# Patient Record
Sex: Male | Born: 1955 | Race: Black or African American | Hispanic: No | Smoking: Never smoker
Health system: Southern US, Community
[De-identification: ages and names within clinical notes are randomized; demographics above are authoritative.]

## PROBLEM LIST (undated history)

## (undated) DIAGNOSIS — G4730 Sleep apnea, unspecified: Secondary | ICD-10-CM

## (undated) DIAGNOSIS — I10 Essential (primary) hypertension: Secondary | ICD-10-CM

## (undated) DIAGNOSIS — D649 Anemia, unspecified: Secondary | ICD-10-CM

## (undated) DIAGNOSIS — M869 Osteomyelitis, unspecified: Secondary | ICD-10-CM

## (undated) DIAGNOSIS — E119 Type 2 diabetes mellitus without complications: Secondary | ICD-10-CM

## (undated) HISTORY — PX: JOINT REPLACEMENT: SHX530

---

## 2013-05-15 ENCOUNTER — Emergency Department: Payer: Self-pay | Admitting: Emergency Medicine

## 2013-05-18 LAB — BETA STREP CULTURE(ARMC)

## 2016-09-23 ENCOUNTER — Encounter
Admission: EM | Disposition: A | Discharge status: Skilled Nursing Facility | Payer: Self-pay | Source: Home / Self Care | Attending: Internal Medicine

## 2016-09-23 ENCOUNTER — Inpatient Hospital Stay: Payer: 59 | Admitting: Anesthesiology

## 2016-09-23 ENCOUNTER — Encounter: Payer: Self-pay | Admitting: Anesthesiology

## 2016-09-23 ENCOUNTER — Emergency Department: Payer: 59

## 2016-09-23 ENCOUNTER — Inpatient Hospital Stay
Admission: EM | Admit: 2016-09-23 | Discharge: 2016-10-10 | DRG: 853 | Disposition: A | Discharge status: Skilled Nursing Facility | Payer: 59 | Attending: Internal Medicine | Admitting: Internal Medicine

## 2016-09-23 DIAGNOSIS — L02611 Cutaneous abscess of right foot: Secondary | ICD-10-CM | POA: Diagnosis present

## 2016-09-23 DIAGNOSIS — D473 Essential (hemorrhagic) thrombocythemia: Secondary | ICD-10-CM | POA: Diagnosis not present

## 2016-09-23 DIAGNOSIS — D638 Anemia in other chronic diseases classified elsewhere: Secondary | ICD-10-CM | POA: Diagnosis present

## 2016-09-23 DIAGNOSIS — E669 Obesity, unspecified: Secondary | ICD-10-CM | POA: Diagnosis present

## 2016-09-23 DIAGNOSIS — M868X8 Other osteomyelitis, other site: Secondary | ICD-10-CM | POA: Diagnosis present

## 2016-09-23 DIAGNOSIS — L089 Local infection of the skin and subcutaneous tissue, unspecified: Secondary | ICD-10-CM

## 2016-09-23 DIAGNOSIS — E876 Hypokalemia: Secondary | ICD-10-CM | POA: Diagnosis present

## 2016-09-23 DIAGNOSIS — Z96653 Presence of artificial knee joint, bilateral: Secondary | ICD-10-CM | POA: Diagnosis present

## 2016-09-23 DIAGNOSIS — E781 Pure hyperglyceridemia: Secondary | ICD-10-CM | POA: Diagnosis present

## 2016-09-23 DIAGNOSIS — J9601 Acute respiratory failure with hypoxia: Secondary | ICD-10-CM | POA: Diagnosis not present

## 2016-09-23 DIAGNOSIS — Z6833 Body mass index (BMI) 33.0-33.9, adult: Secondary | ICD-10-CM

## 2016-09-23 DIAGNOSIS — G253 Myoclonus: Secondary | ICD-10-CM | POA: Diagnosis not present

## 2016-09-23 DIAGNOSIS — A401 Sepsis due to streptococcus, group B: Secondary | ICD-10-CM | POA: Diagnosis present

## 2016-09-23 DIAGNOSIS — G9341 Metabolic encephalopathy: Secondary | ICD-10-CM | POA: Diagnosis present

## 2016-09-23 DIAGNOSIS — E871 Hypo-osmolality and hyponatremia: Secondary | ICD-10-CM | POA: Diagnosis present

## 2016-09-23 DIAGNOSIS — R4182 Altered mental status, unspecified: Secondary | ICD-10-CM

## 2016-09-23 DIAGNOSIS — E1140 Type 2 diabetes mellitus with diabetic neuropathy, unspecified: Secondary | ICD-10-CM | POA: Diagnosis present

## 2016-09-23 DIAGNOSIS — N179 Acute kidney failure, unspecified: Secondary | ICD-10-CM | POA: Diagnosis present

## 2016-09-23 DIAGNOSIS — E11621 Type 2 diabetes mellitus with foot ulcer: Secondary | ICD-10-CM | POA: Diagnosis present

## 2016-09-23 DIAGNOSIS — E1169 Type 2 diabetes mellitus with other specified complication: Secondary | ICD-10-CM | POA: Diagnosis present

## 2016-09-23 DIAGNOSIS — E1165 Type 2 diabetes mellitus with hyperglycemia: Secondary | ICD-10-CM | POA: Diagnosis present

## 2016-09-23 DIAGNOSIS — E119 Type 2 diabetes mellitus without complications: Secondary | ICD-10-CM | POA: Diagnosis present

## 2016-09-23 DIAGNOSIS — Z79899 Other long term (current) drug therapy: Secondary | ICD-10-CM

## 2016-09-23 DIAGNOSIS — E1152 Type 2 diabetes mellitus with diabetic peripheral angiopathy with gangrene: Secondary | ICD-10-CM | POA: Diagnosis present

## 2016-09-23 DIAGNOSIS — Z823 Family history of stroke: Secondary | ICD-10-CM

## 2016-09-23 DIAGNOSIS — L0390 Cellulitis, unspecified: Secondary | ICD-10-CM | POA: Diagnosis present

## 2016-09-23 DIAGNOSIS — E11628 Type 2 diabetes mellitus with other skin complications: Secondary | ICD-10-CM | POA: Diagnosis present

## 2016-09-23 DIAGNOSIS — A480 Gas gangrene: Secondary | ICD-10-CM | POA: Diagnosis present

## 2016-09-23 DIAGNOSIS — Z713 Dietary counseling and surveillance: Secondary | ICD-10-CM

## 2016-09-23 DIAGNOSIS — I70262 Atherosclerosis of native arteries of extremities with gangrene, left leg: Secondary | ICD-10-CM | POA: Diagnosis not present

## 2016-09-23 DIAGNOSIS — L02612 Cutaneous abscess of left foot: Secondary | ICD-10-CM | POA: Diagnosis present

## 2016-09-23 DIAGNOSIS — A419 Sepsis, unspecified organism: Secondary | ICD-10-CM | POA: Diagnosis present

## 2016-09-23 HISTORY — PX: AMPUTATION TOE: SHX6595

## 2016-09-23 HISTORY — PX: INCISION AND DRAINAGE: SHX5863

## 2016-09-23 LAB — BASIC METABOLIC PANEL
ANION GAP: 14 (ref 5–15)
BUN: 32 mg/dL — ABNORMAL HIGH (ref 6–20)
CALCIUM: 8.8 mg/dL — AB (ref 8.9–10.3)
CO2: 23 mmol/L (ref 22–32)
CREATININE: 1.38 mg/dL — AB (ref 0.61–1.24)
Chloride: 88 mmol/L — ABNORMAL LOW (ref 101–111)
GFR, EST NON AFRICAN AMERICAN: 54 mL/min — AB (ref >60)
GLUCOSE: 452 mg/dL — AB (ref 65–99)
Potassium: 3.5 mmol/L (ref 3.5–5.1)
Sodium: 125 mmol/L — ABNORMAL LOW (ref 135–145)

## 2016-09-23 LAB — GLUCOSE, CAPILLARY
GLUCOSE-CAPILLARY: 433 mg/dL — AB (ref 65–99)
Glucose-Capillary: 432 mg/dL — ABNORMAL HIGH (ref 65–99)
Glucose-Capillary: 415 mg/dL — ABNORMAL HIGH (ref 65–99)

## 2016-09-23 LAB — CBC
HCT: 36.7 % — ABNORMAL LOW (ref 40.0–52.0)
HEMOGLOBIN: 12.4 g/dL — AB (ref 13.0–18.0)
MCH: 28.6 pg (ref 26.0–34.0)
MCHC: 33.7 g/dL (ref 32.0–36.0)
MCV: 84.9 fL (ref 80.0–100.0)
PLATELETS: 221 10*3/uL (ref 150–440)
RBC: 4.32 MIL/uL — ABNORMAL LOW (ref 4.40–5.90)
RDW: 13.6 % (ref 11.5–14.5)
WBC: 25.4 10*3/uL — ABNORMAL HIGH (ref 3.8–10.6)

## 2016-09-23 LAB — LACTIC ACID, PLASMA: Lactic Acid, Venous: 2.8 mmol/L (ref 0.5–1.9)

## 2016-09-23 SURGERY — MINOR IRRIGATION AND DEBRIDEMENT EXTREMITY
Anesthesia: Choice | Laterality: Left

## 2016-09-23 SURGERY — INCISION AND DRAINAGE
Anesthesia: General | Laterality: Left

## 2016-09-23 MED ORDER — LIDOCAINE HCL (CARDIAC) 20 MG/ML IV SOLN
INTRAVENOUS | Status: DC | PRN
  Administered 2016-09-23: 40 mg via INTRAVENOUS

## 2016-09-23 MED ORDER — PROPOFOL 10 MG/ML IV BOLUS
INTRAVENOUS | Status: AC
Start: 2016-09-23 — End: 2016-09-23
  Filled 2016-09-23: qty 20

## 2016-09-23 MED ORDER — SODIUM CHLORIDE 0.9 % IV BOLUS (SEPSIS)
1000.0000 mL | Freq: Once | INTRAVENOUS | Status: AC
  Administered 2016-09-23: 1000 mL via INTRAVENOUS

## 2016-09-23 MED ORDER — VANCOMYCIN HCL IN DEXTROSE 1-5 GM/200ML-% IV SOLN
1000.0000 mg | Freq: Once | INTRAVENOUS | Status: AC
  Administered 2016-09-23: 1000 mg via INTRAVENOUS
  Filled 2016-09-23: qty 200

## 2016-09-23 MED ORDER — PROPOFOL 10 MG/ML IV BOLUS
INTRAVENOUS | Status: DC | PRN
  Administered 2016-09-23: 200 mg via INTRAVENOUS

## 2016-09-23 MED ORDER — SUCCINYLCHOLINE CHLORIDE 20 MG/ML IJ SOLN
INTRAMUSCULAR | Status: DC | PRN
  Administered 2016-09-23: 120 mg via INTRAVENOUS

## 2016-09-23 MED ORDER — SUCCINYLCHOLINE CHLORIDE 20 MG/ML IJ SOLN
INTRAMUSCULAR | Status: AC
  Filled 2016-09-23: qty 1

## 2016-09-23 MED ORDER — MIDAZOLAM HCL 2 MG/2ML IJ SOLN
INTRAMUSCULAR | Status: DC | PRN
  Administered 2016-09-23: 2 mg via INTRAVENOUS

## 2016-09-23 MED ORDER — ROCURONIUM BROMIDE 100 MG/10ML IV SOLN
INTRAVENOUS | Status: DC | PRN
  Administered 2016-09-23: 25 mg via INTRAVENOUS
  Administered 2016-09-23: 10 mg via INTRAVENOUS

## 2016-09-23 MED ORDER — VANCOMYCIN HCL IN DEXTROSE 1-5 GM/200ML-% IV SOLN
1000.0000 mg | Freq: Two times a day (BID) | INTRAVENOUS | Status: DC
  Administered 2016-09-24 – 2016-09-25 (×3): 1000 mg via INTRAVENOUS
  Filled 2016-09-23 (×5): qty 200

## 2016-09-23 MED ORDER — LIDOCAINE HCL (PF) 2 % IJ SOLN
INTRAMUSCULAR | Status: AC
  Filled 2016-09-23: qty 2

## 2016-09-23 MED ORDER — SODIUM CHLORIDE 0.9 % IV SOLN
INTRAVENOUS | Status: DC | PRN
  Administered 2016-09-23: 23:00:00 via INTRAVENOUS

## 2016-09-23 MED ORDER — FENTANYL CITRATE (PF) 100 MCG/2ML IJ SOLN
INTRAMUSCULAR | Status: DC | PRN
  Administered 2016-09-23: 100 ug via INTRAVENOUS
  Administered 2016-09-24 (×4): 25 ug via INTRAVENOUS

## 2016-09-23 MED ORDER — BUPIVACAINE HCL (PF) 0.25 % IJ SOLN
INTRAMUSCULAR | Status: AC
  Filled 2016-09-23: qty 30

## 2016-09-23 MED ORDER — PIPERACILLIN-TAZOBACTAM 3.375 G IVPB
3.3750 g | Freq: Three times a day (TID) | INTRAVENOUS | Status: DC
  Administered 2016-09-24 (×2): 3.375 g via INTRAVENOUS
  Filled 2016-09-23 (×5): qty 50

## 2016-09-23 MED ORDER — PIPERACILLIN-TAZOBACTAM 3.375 G IVPB 30 MIN
3.3750 g | Freq: Once | INTRAVENOUS | Status: AC
  Administered 2016-09-23: 3.375 g via INTRAVENOUS
  Filled 2016-09-23: qty 50

## 2016-09-23 MED ORDER — MIDAZOLAM HCL 2 MG/2ML IJ SOLN
INTRAMUSCULAR | Status: AC
  Filled 2016-09-23: qty 2

## 2016-09-23 MED ORDER — LIDOCAINE HCL 2 % EX GEL
CUTANEOUS | Status: AC
  Filled 2016-09-23: qty 5

## 2016-09-23 MED ORDER — FENTANYL CITRATE (PF) 100 MCG/2ML IJ SOLN
INTRAMUSCULAR | Status: AC
  Filled 2016-09-23: qty 2

## 2016-09-23 SURGICAL SUPPLY — 71 items
BANDAGE ACE 4X5 VEL STRL LF (GAUZE/BANDAGES/DRESSINGS) ×3 IMPLANT
BANDAGE ELASTIC 4 LF NS (GAUZE/BANDAGES/DRESSINGS) ×3 IMPLANT
BANDAGE STRETCH 3X4.1 STRL (GAUZE/BANDAGES/DRESSINGS) ×6 IMPLANT
BLADE OSC/SAGITTAL MD 5.5X18 (BLADE) IMPLANT
BLADE OSCILLATING/SAGITTAL (BLADE) ×1
BLADE SURG 15 STRL LF DISP TIS (BLADE) ×2 IMPLANT
BLADE SURG 15 STRL SS (BLADE) ×1
BLADE SURG MINI STRL (BLADE) IMPLANT
BLADE SW THK.38XMED LNG THN (BLADE) ×2 IMPLANT
BNDG COHESIVE 4X5 TAN STRL (GAUZE/BANDAGES/DRESSINGS) ×3 IMPLANT
BNDG COHESIVE 6X5 TAN STRL LF (GAUZE/BANDAGES/DRESSINGS) IMPLANT
BNDG ESMARK 4X12 TAN STRL LF (GAUZE/BANDAGES/DRESSINGS) ×6 IMPLANT
BNDG GAUZE 4.5X4.1 6PLY STRL (MISCELLANEOUS) ×6 IMPLANT
CANISTER SUCT 1200ML W/VALVE (MISCELLANEOUS) ×3 IMPLANT
CANISTER SUCT 3000ML PPV (MISCELLANEOUS) ×3 IMPLANT
CUFF TOURN 18 STER (MISCELLANEOUS) ×6 IMPLANT
CUFF TOURN DUAL PL 12 NO SLV (MISCELLANEOUS) IMPLANT
DRAPE BILAT LIMB 76X120 89291 (MISCELLANEOUS) ×3 IMPLANT
DRAPE FLUOR MINI C-ARM 54X84 (DRAPES) ×3 IMPLANT
DRAPE XRAY CASSETTE 23X24 (DRAPES) IMPLANT
DRESSING ALLEVYN 4X4 (MISCELLANEOUS) IMPLANT
DURAPREP 26ML APPLICATOR (WOUND CARE) ×3 IMPLANT
ELECT REM PT RETURN 9FT ADLT (ELECTROSURGICAL) ×3
ELECTRODE REM PT RTRN 9FT ADLT (ELECTROSURGICAL) ×2 IMPLANT
GAUZE PACKING 1/4 X5 YD (GAUZE/BANDAGES/DRESSINGS) IMPLANT
GAUZE PACKING IODOFORM 1/2 (PACKING) ×3 IMPLANT
GAUZE PACKING IODOFORM 1X5 (MISCELLANEOUS) IMPLANT
GAUZE PETRO XEROFOAM 1X8 (MISCELLANEOUS) ×6 IMPLANT
GAUZE SPONGE 4X4 12PLY STRL (GAUZE/BANDAGES/DRESSINGS) ×9 IMPLANT
GAUZE STRETCH 2X75IN STRL (MISCELLANEOUS) ×3 IMPLANT
GLOVE BIO SURGEON STRL SZ7.5 (GLOVE) ×9 IMPLANT
GLOVE INDICATOR 8.0 STRL GRN (GLOVE) ×9 IMPLANT
GOWN STRL REUS W/ TWL LRG LVL3 (GOWN DISPOSABLE) ×4 IMPLANT
GOWN STRL REUS W/TWL LRG LVL3 (GOWN DISPOSABLE) ×2
GOWN STRL REUS W/TWL MED LVL3 (GOWN DISPOSABLE) ×3 IMPLANT
HANDPIECE INTERPULSE COAX TIP (DISPOSABLE) ×1
HANDPIECE VERSAJET DEBRIDEMENT (MISCELLANEOUS) ×3 IMPLANT
IV NS 1000ML (IV SOLUTION) ×1
IV NS 1000ML BAXH (IV SOLUTION) ×2 IMPLANT
KIT DRSG VAC SLVR GRANUFM (MISCELLANEOUS) IMPLANT
KIT RM TURNOVER STRD PROC AR (KITS) ×3 IMPLANT
LABEL OR SOLS (LABEL) ×3 IMPLANT
NEEDLE FILTER BLUNT 18X 1/2SAF (NEEDLE) ×1
NEEDLE FILTER BLUNT 18X1 1/2 (NEEDLE) ×2 IMPLANT
NEEDLE HYPO 25X1 1.5 SAFETY (NEEDLE) ×9 IMPLANT
NS IRRIG 500ML POUR BTL (IV SOLUTION) ×3 IMPLANT
PACK EXTREMITY ARMC (MISCELLANEOUS) ×3 IMPLANT
PAD ABD DERMACEA PRESS 5X9 (GAUZE/BANDAGES/DRESSINGS) ×6 IMPLANT
PENCIL ELECTRO HAND CTR (MISCELLANEOUS) ×6 IMPLANT
RASP SM TEAR CROSS CUT (RASP) IMPLANT
SET HNDPC FAN SPRY TIP SCT (DISPOSABLE) ×2 IMPLANT
SOL .9 NS 3000ML IRR  AL (IV SOLUTION) ×1
SOL .9 NS 3000ML IRR UROMATIC (IV SOLUTION) ×2 IMPLANT
STOCKINETTE IMPERVIOUS 9X36 MD (GAUZE/BANDAGES/DRESSINGS) ×12 IMPLANT
STOCKINETTE M/LG 89821 (MISCELLANEOUS) ×3 IMPLANT
STRAP SAFETY BODY (MISCELLANEOUS) ×3 IMPLANT
SUT ETHILON 2 0 FS 18 (SUTURE) IMPLANT
SUT ETHILON 3-0 FS-10 30 BLK (SUTURE) ×9
SUT ETHILON 4-0 (SUTURE)
SUT ETHILON 4-0 FS2 18XMFL BLK (SUTURE)
SUT ETHILON 5-0 FS-2 18 BLK (SUTURE) IMPLANT
SUT VIC AB 3-0 SH 27 (SUTURE) ×1
SUT VIC AB 3-0 SH 27X BRD (SUTURE) ×2 IMPLANT
SUT VIC AB 4-0 FS2 27 (SUTURE) IMPLANT
SUTURE EHLN 3-0 FS-10 30 BLK (SUTURE) ×6 IMPLANT
SUTURE ETHLN 4-0 FS2 18XMF BLK (SUTURE) IMPLANT
SWAB CULTURE AMIES ANAERIB BLU (MISCELLANEOUS) ×6 IMPLANT
SYR 3ML LL SCALE MARK (SYRINGE) ×3 IMPLANT
SYRINGE 10CC LL (SYRINGE) ×9 IMPLANT
TRAY PREP VAG/GEN (MISCELLANEOUS) ×3 IMPLANT
WND VAC CANISTER 500ML (MISCELLANEOUS) IMPLANT

## 2016-09-23 NOTE — Consult Note (Signed)
ORTHOPAEDIC CONSULTATION  REQUESTING PHYSICIAN: Hugelmeyer, Alexis, DO  Chief Complaint: Infection b/l feet  HPI: Kyle Banks is a 61 y.o. male who complains of  Recent draining infection.  Denies any complaints prior to 3-4 days ago.  Noticed redness in pain to feet.  Came to ER today for evaluation.  Noted to have blood sugar in 400 range and xrays concerning for gas in soft tissue.  Markedly elevated WBC and tachycardic.  Code sepsis currently active. Does not have a pcp.  No past medical history on file. No past surgical history on file. Social History   Social History  . Marital status: Divorced    Spouse name: N/A  . Number of children: N/A  . Years of education: N/A   Social History Main Topics  . Smoking status: Not on file  . Smokeless tobacco: Not on file  . Alcohol use Not on file  . Drug use: Unknown  . Sexual activity: Not on file   Other Topics Concern  . Not on file   Social History Narrative  . No narrative on file   No family history on file. No Known Allergies Prior to Admission medications   Medication Sig Start Date End Date Taking? Authorizing Provider  cyanocobalamin 500 MCG tablet Take 500 mcg by mouth daily.   Yes [provider]  Garlic 10 MG CAPS Take 1 tablet by mouth daily.   Yes [provider]  Omega-3 Fatty Acids (FISH OIL) 1000 MG CAPS Take 1 capsule by mouth daily.   Yes [provider]   Dg Foot 2 Views Left  Result Date: 09/23/2016 CLINICAL DATA:  Drainage and swelling of the great toe EXAM: LEFT FOOT - 2 VIEW COMPARISON:  None. FINDINGS: Osteopenia is present. No fracture or subluxation is seen. Soft tissue gas tracking along the dorsum of the foot and along the medial and lateral aspect of the first digit around the MTP joint. Possible small marginal erosion first DIP joint. Plantar soft tissue calcification and vascular calcification. IMPRESSION: 1. Soft tissue emphysema over the dorsum of the foot, and  surrounding the first digit at the level of the distal metatarsal and proximal phalanx, concerning for necrotizing infection. 2. Lucent appearing first distal phalanx, cannot exclude osteomyelitis. Question of a small marginal erosion at the first PIP joint, joint space infection is a concern given the surrounding soft tissue findings. Electronically Signed   By: Donavan Foil M.D.   On: 09/23/2016 21:03   Dg Foot 2 Views Right  Result Date: 09/23/2016 CLINICAL DATA:  Swelling and drainage from the great toes on both feet EXAM: RIGHT FOOT - 2 VIEW COMPARISON:  None. FINDINGS: No fracture or malalignment. Soft tissue thickening and gas at the distal first digit and to a lesser extent the second digit. Irregular lucency and probable erosive change involving the mid and distal portion of the first distal phalanx. Plantar soft tissue calcification. IMPRESSION: 1. Soft tissue enlargement with small amount of soft tissue gas at the distal first digit. 2. Lucency and probable erosive change involving the distal first phalanx suspicious for osteomyelitis. Electronically Signed   By: Donavan Foil M.D.   On: 09/23/2016 20:59    Positive ROS: All other systems have been reviewed and were otherwise negative with the exception of those mentioned in the HPI and as above.  12 point ROS was performed.  Physical Exam: General: Alert and oriented.  No apparent distress.  Family member at bedside.  Vascular:  Left foot:Dorsalis  Pedis:  present Posterior Tibial:  diminished secondary to edema  Right foot: Dorsalis Pedis:  present Posterior Tibial:  diminished secondary to edema  Neuro:absent protective sensation  Derm:Right foot:  Noted large open ulceration distal great toe with foul odor, purulence and exposed bone.  Diffuse erythema to midfoot.  Left foot:  Large ulceration distal great toe with foul odor, purulence and exposed bone.  Diffuse erythema to midfoot.    Ortho/MS: b/l edema.  Palpable emphysema  to left foot midfoot and great toe.   Assessment: Gas producing infection b/l on xray with osteomyelitis b/l great toes. DM with neuropathy.  Plan: Pt with markedly elevated WBC with gas producing infection will need I & D tonight.  Will plan for amputation b/l great toes and I & D of any residual infection.  NPO now and has not eaten today.  Pepsi at noon today and water at 5pm.  D/w pt r/b/a/c and consent given.    Elesa Hacker, DPM Cell [phone]   09/23/2016 10:18 PM

## 2016-09-23 NOTE — Anesthesia Preprocedure Evaluation (Addendum)
Anesthesia Evaluation  Patient identified by MRN, date of birth, ID band Patient awake    Reviewed: Allergy & Precautions, H&P , NPO status , Patient's Chart, lab work & pertinent test results, reviewed documented beta blocker date and time   History of Anesthesia Complications Negative for: history of anesthetic complications  Airway Mallampati: I  TM Distance: >3 FB Neck ROM: full    Dental  (+) Teeth Intact, Dental Advidsory Given   Pulmonary neg pulmonary ROS,           Cardiovascular Exercise Tolerance: Good negative cardio ROS       Neuro/Psych negative neurological ROS  negative psych ROS   GI/Hepatic negative GI ROS, Neg liver ROS,   Endo/Other  diabetes, Poorly Controlled  Renal/GU CRFRenal disease  negative genitourinary   Musculoskeletal   Abdominal   Peds  Hematology negative hematology ROS (+)   Anesthesia Other Findings History reviewed. No pertinent past medical history.  Patient does not routinely see a doctor and presented to the ER with a blood sugar in the 450s.  Blood sugar has been consistently elevated above 400 since that time.  Given that the patient has gas seen on xray this has been deemed a surgical emergency and we will proceed as such despite inadequate blood sugar control.  Reproductive/Obstetrics negative OB ROS                            Anesthesia Physical Anesthesia Plan  ASA: III and emergent  Anesthesia Plan: General, Rapid Sequence and Cricoid Pressure   Post-op Pain Management:    Induction:   PONV Risk Score and Plan: 2 and Ondansetron  Airway Management Planned:   Additional Equipment:   Intra-op Plan:   Post-operative Plan:   Informed Consent: I have reviewed the patients History and Physical, chart, labs and discussed the procedure including the risks, benefits and alternatives for the proposed anesthesia with the patient or  authorized representative who has indicated his/her understanding and acceptance.   Dental Advisory Given  Plan Discussed with: Anesthesiologist, CRNA and Surgeon  Anesthesia Plan Comments:         Anesthesia Quick Evaluation

## 2016-09-23 NOTE — ED Triage Notes (Signed)
Patient reports symptoms started Friday.  Reports feeling dizzy and "my toes exploded".  Patient reports great toes on both feet with swelling and drainage.  Noted to bilateral great toes swelling, dried exudate with foul odor.

## 2016-09-23 NOTE — Progress Notes (Signed)
Pharmacy Antibiotic Note  Kyle Banks is a 61 y.o. male admitted on 09/23/2016 with sepsis.  Pharmacy has been consulted for Zosyn and vancomycin dosing.  Plan: 1. Zosyn 3.375 gm IV Q8H EI 1. Vancomycin 1 gm IV x 1 followed in approximately 6 hours (stacked dosing) by vancomycin 1 gm IV Q12H, predicted trough 18 mcg/mL. Pharmacy will continue to follow and adjust as needed to maintain trough 15 to 20 mcg/mL.  Vd 56.7 L, Ke 0.058 hr-1, T1/2 11.9 hr  Height: 5\' 7"  (170.2 cm) Weight: 228 lb (103.4 kg) IBW/kg (Calculated) : 66.1  Temp (24hrs), Avg:98.8 F (37.1 C), Min:98.8 F (37.1 C), Max:98.8 F (37.1 C)   Recent Labs Lab 09/23/16 1944 09/23/16 1947  WBC  --  25.4*  CREATININE  --  1.38*  LATICACIDVEN 2.8*  --     Estimated Creatinine Clearance: 64.4 mL/min (A) (by C-G formula based on SCr of 1.38 mg/dL (H)).    Not on File  Thank you for allowing pharmacy to be a part of this patient's care.  Laural Benes, Pharm.D., BCPS Clinical Pharmacist 09/23/2016 9:33 PM

## 2016-09-23 NOTE — H&P (Signed)
History and Physical   [facility] - [place] @ [facility] Admission History and Physical [employer], D.O.    Patient Name: Kyle Banks MR#: [id number] Date of Birth: 30-Sep-1955 Date of Admission: 09/23/2016  Referring MD/NP/PA: Dr. Clearnce Hasten Primary Care Physician: No primary care provider on file. Patient coming from: Home  Chief Complaint:  Chief Complaint  Patient presents with  . Dizziness  . Wound Infection    HPI: Kyle Banks is a 61 y.o. male with no known medical history presents to the emergency department for evaluation of foot wounds.  Patient was in a usual state of health until 3 days ago when he describes the sudden onset of redness, pain, swelling and discharge from his feet bilaterally. He states prior to this infection his feet appeared normal. Patient is unaware of any medical problems such as diabetes and states that he has had annual physicals and blood work at various urgent care centers..  Patient denies fevers/chills, weakness, dizziness, chest pain, shortness of breath, N/V/C/D, abdominal pain, dysuria/frequency, changes in mental status.    Otherwise there has been no change in status. Patient has been taking medication as prescribed and there has been no recent change in medication or diet.  No recent antibiotics.  There has been no recent illness, hospitalizations, travel or sick contacts.    EMS/ED Course: Patient received Vanco, Zosyn, Normal saline. Medical admission was requested for sepsis secondary to cellulitis/wound infection of the bilateral toes and new onset diabetes. He was seen in the emergency department by podiatry who plans for OR tonight for I&D and possible amputation given concern for osteomyelitis and x-rays with gas in the soft tissues.  Review of Systems:  CONSTITUTIONAL: No fever/chills, fatigue, weakness, weight gain/loss, headache. EYES: No blurry or double vision. ENT: No tinnitus, postnasal drip, redness or soreness  of the oropharynx. RESPIRATORY: No cough, dyspnea, wheeze.  No hemoptysis.  CARDIOVASCULAR: No chest pain, palpitations, syncope, orthopnea. No lower extremity edema.  GASTROINTESTINAL: No nausea, vomiting, abdominal pain, diarrhea, constipation.  No hematemesis, melena or hematochezia. GENITOURINARY: No dysuria, frequency, hematuria. ENDOCRINE: No polyuria or nocturia. No heat or cold intolerance. HEMATOLOGY: No anemia, bruising, bleeding. INTEGUMENTARY: Positive toe wounds bilaterally. No rashes, ulcers, lesions. MUSCULOSKELETAL: No arthritis, gout, dyspnea. NEUROLOGIC: No numbness, tingling, ataxia, seizure-type activity, weakness. PSYCHIATRIC: No anxiety, depression, insomnia.   Past medical history: Denies  Past surgical history: Right knee surgery 5  Social history: Works as a Freight forwarder. Denies alcohol tobacco or drug use  Medications: No prescribed medications that he takes over-the-counter garlic and fish oil  Family history: Father and sister with stroke.  Prior to Admission medications   Not on File    Physical Exam: Vitals:   09/23/16 1918 09/23/16 1924  BP:  130/67  Pulse:  (!) 110  Resp:  20  Temp:  98.8 F (37.1 C)  TempSrc:  Oral  SpO2:  95%  Weight: 103.4 kg (228 lb)   Height: 5\' 7"  (1.702 m)     GENERAL: 61 y.o.-year-old Male patient, well-developed, well-nourished lying in the bed in no acute distress.  Pleasant and cooperative.   HEENT: Head atraumatic, normocephalic. Pupils equal, round, reactive to light and accommodation. No scleral icterus. Extraocular muscles intact. Nares are patent. Oropharynx is clear. Mucus membranes moist. NECK: Supple, full range of motion. No JVD, no bruit heard. No thyroid enlargement, no tenderness, no cervical lymphadenopathy. CHEST: Normal breath sounds bilaterally. No wheezing, rales, rhonchi or crackles. No use of accessory muscles of respiration.  No reproducible chest wall tenderness.  CARDIOVASCULAR: S1, S2  normal. No murmurs, rubs, or gallops. Cap refill <2 seconds.  ABDOMEN: Soft, nondistended, nontender. No rebound, guarding, rigidity. Normoactive bowel sounds present in all four quadrants. No organomegaly or mass. EXTREMITIES: Foot wounds recently dressed but noted by emergency department and podiatry to have open ulcerations with foul odor, purulent discharge and exposed bone with erythema at to the midfoot on the right as well as distal first toe ulceration with foul odor, purulent discharge and exposed bone, erythema to the midfoot on the left. Bilateral lower extremity edema mild and non-pitting to the mid calf   No calf tenderness or Homan's sign. Diminished pulses at the posterior tibial bilaterally NEUROLOGIC: The patient is alert and oriented x 3. Cranial nerves II through XII are grossly intact with no focal sensorimotor deficit. Muscle strength 5/5 in all extremities. Sensation intact. .   Labs on Admission:  CBC:  Recent Labs Lab 09/23/16 1947  WBC 25.4*  HGB 12.4*  HCT 36.7*  MCV 84.9  PLT 756   Basic Metabolic Panel:  Recent Labs Lab 09/23/16 1947  NA 125*  K 3.5  CL 88*  CO2 23  GLUCOSE 452*  BUN 32*  CREATININE 1.38*  CALCIUM 8.8*   GFR: Estimated Creatinine Clearance: 64.4 mL/min (A) (by C-G formula based on SCr of 1.38 mg/dL (H)). Liver Function Tests: No results for input(s): AST, ALT, ALKPHOS, BILITOT, PROT, ALBUMIN in the last 168 hours. No results for input(s): LIPASE, AMYLASE in the last 168 hours. No results for input(s): AMMONIA in the last 168 hours. Coagulation Profile: No results for input(s): INR, PROTIME in the last 168 hours. Cardiac Enzymes: No results for input(s): CKTOTAL, CKMB, CKMBINDEX, TROPONINI in the last 168 hours. BNP (last 3 results) No results for input(s): PROBNP in the last 8760 hours. HbA1C: No results for input(s): HGBA1C in the last 72 hours. CBG:  Recent Labs Lab 09/23/16 1947 09/23/16 1948  GLUCAP 433* 432*    Lipid Profile: No results for input(s): CHOL, HDL, LDLCALC, TRIG, CHOLHDL, LDLDIRECT in the last 72 hours. Thyroid Function Tests: No results for input(s): TSH, T4TOTAL, FREET4, T3FREE, THYROIDAB in the last 72 hours. Anemia Panel: No results for input(s): VITAMINB12, FOLATE, FERRITIN, TIBC, IRON, RETICCTPCT in the last 72 hours. Urine analysis: No results found for: COLORURINE, APPEARANCEUR, LABSPEC, PHURINE, GLUCOSEU, HGBUR, BILIRUBINUR, KETONESUR, PROTEINUR, UROBILINOGEN, NITRITE, LEUKOCYTESUR Sepsis Labs: @LABRCNTIP (procalcitonin:4,lacticidven:4) )No results found for this or any previous visit (from the past 240 hour(s)).   Radiological Exams on Admission: Dg Foot 2 Views Left  Result Date: 09/23/2016 CLINICAL DATA:  Drainage and swelling of the great toe EXAM: LEFT FOOT - 2 VIEW COMPARISON:  None. FINDINGS: Osteopenia is present. No fracture or subluxation is seen. Soft tissue gas tracking along the dorsum of the foot and along the medial and lateral aspect of the first digit around the MTP joint. Possible small marginal erosion first DIP joint. Plantar soft tissue calcification and vascular calcification. IMPRESSION: 1. Soft tissue emphysema over the dorsum of the foot, and surrounding the first digit at the level of the distal metatarsal and proximal phalanx, concerning for necrotizing infection. 2. Lucent appearing first distal phalanx, cannot exclude osteomyelitis. Question of a small marginal erosion at the first PIP joint, joint space infection is a concern given the surrounding soft tissue findings. Electronically Signed   By: Donavan Foil M.D.   On: 09/23/2016 21:03   Dg Foot 2 Views Right  Result Date: 09/23/2016 CLINICAL  DATA:  Swelling and drainage from the great toes on both feet EXAM: RIGHT FOOT - 2 VIEW COMPARISON:  None. FINDINGS: No fracture or malalignment. Soft tissue thickening and gas at the distal first digit and to a lesser extent the second digit. Irregular  lucency and probable erosive change involving the mid and distal portion of the first distal phalanx. Plantar soft tissue calcification. IMPRESSION: 1. Soft tissue enlargement with small amount of soft tissue gas at the distal first digit. 2. Lucency and probable erosive change involving the distal first phalanx suspicious for osteomyelitis. Electronically Signed   By: Donavan Foil M.D.   On: 09/23/2016 20:59    EKG: Sinus tachycardia at 106 bpm with normal axis and nonspecific ST-T wave changes.   Assessment/Plan  This is a 61 y.o. male with no known past medical history now being admitted with:  #. Sepsis secondary to cellulitis/osteomyelitis of bilateral first toes - Admit to inpatient with telemetry monitoring - IV antibiotics: Zosyn, Vanco - IV fluid hydration - Follow up blood, wound cultures - Repeat CBC in am.  - Podiatry consultation has been requested and patient will go to the OR for debridement tonight   #. New onset diabetes - Accuchecks q4h with RISS coverage - Check A1C - Diabetes educator / coordinator consulted  #. Hyponatremia - IVFs and repeat BMP in AM  #. Renal insufficiency, ? acute  - IV fluids and repeat BMP in AM.  - Avoid nephrotoxic medications - Renal sono if not improving.  - Bladder scan and place foley catheter if evidence of urinary retention  Admission status: Inpatient IV Fluids: Normal saline Diet/Nutrition: NPO for possible OR Consults called: Podiatry, diabetes educator  DVT Px: Lovenox, SCDs and early ambulation. Code Status: Full Code  Disposition Plan: To home in 2-3 days  All the records are reviewed and case discussed with ED provider. Management plans discussed with the patient and/or family who express understanding and agree with plan of care.  Clinten Howk D.O. on 09/23/2016 at 9:40 PM Between 7am to 6pm - Pager - [phone] After 6pm go to [email or web address] - Marketing executive [place]  [facility] Office [phone] CC: Primary care physician; No primary care provider on file.   09/23/2016, 9:40 PM

## 2016-09-23 NOTE — ED Notes (Signed)
Lab called to give critical lactic acid of 2.8. First nurse notified and given room 2.

## 2016-09-23 NOTE — ED Provider Notes (Signed)
[facility] Emergency Department Provider Note  ____________________________________________   First MD Initiated Contact with Patient 09/23/16 2042     (approximate)  I have reviewed the triage vital signs and the nursing notes.   HISTORY  Chief Complaint Dizziness and Wound Infection   HPI Kyle Banks is a 61 y.o. male without any chronic medical conditions was presenting to the emergency department today with 2-3 days of lower extremity swelling. He denies having history of diabetes and says that 2 days ago his lower extremities were completely normal. However, he has noticed pus and redness to his bilateral feet since then.   No past medical history on file.  There are no active problems to display for this patient.   No past surgical history on file.  Prior to Admission medications   Not on File    Allergies Patient has no allergy information on record.  No family history on file.  Social History Social History  Substance Use Topics  . Smoking status: Not on file  . Smokeless tobacco: Not on file  . Alcohol use Not on file    Review of Systems  Constitutional: No fever/chills Eyes: No visual changes. ENT: No sore throat. Cardiovascular: Denies chest pain. Respiratory: Denies shortness of breath. Gastrointestinal: No abdominal pain.  No nausea, no vomiting.  No diarrhea.  No constipation. Genitourinary: Negative for dysuria. Musculoskeletal: Negative for back pain. Skin: as above Neurological: Negative for headaches, focal weakness or numbness.   ____________________________________________   PHYSICAL EXAM:  VITAL SIGNS: ED Triage Vitals  Enc Vitals Group     BP 09/23/16 1924 130/67     Pulse Rate 09/23/16 1924 (!) 110     Resp 09/23/16 1924 20     Temp 09/23/16 1924 98.8 F (37.1 C)     Temp Source 09/23/16 1924 Oral     SpO2 09/23/16 1924 95 %     Weight 09/23/16 1918 228 lb (103.4 kg)     Height 09/23/16  1918 5\' 7"  (1.702 m)     Head Circumference --      Peak Flow --      Pain Score 09/23/16 1918 9     Pain Loc --      Pain Edu? --      Excl. in [place]? --     Constitutional: Alert and oriented. Well appearing and in no acute distress. Eyes: Conjunctivae are normal.  Head: Atraumatic. Nose: No congestion/rhinnorhea. Mouth/Throat: Mucous membranes are moist.  Neck: No stridor.   Cardiovascular: Tachycardic, regular rhythm. Grossly normal heart sounds.  Good peripheral circulation With equal and intact or Celsius pulses. Respiratory: Normal respiratory effort.  No retractions. Lungs CTAB. Gastrointestinal: Soft and nontender. No distention.  Musculoskeletal: No lower extremity tenderness.  No joint effusions. Neurologic:  Normal speech and language. No gross focal neurologic deficits are appreciated. Skin:  Bilateral lower extremity erythema to the bilateral feet with pus visualized at the bilateral great toes with ulceration over the plantar aspects of the bilateral great toes. There is also a tree bark-like chronic appearance the bilateral great toes with swelling bilaterally extending up onto the forefoot. Mild tenderness to palpation. Warmth to touch. Psychiatric: Mood and affect are normal. Speech and behavior are normal.  ____________________________________________   LABS (all labs ordered are listed, but only abnormal results are displayed)  Labs Reviewed  BASIC METABOLIC PANEL - Abnormal; Notable for the following:       Result Value   Sodium 125 (*)  Chloride 88 (*)    Glucose, Bld 452 (*)    BUN 32 (*)    Creatinine, Ser 1.38 (*)    Calcium 8.8 (*)    GFR calc non Af Amer 54 (*)    All other components within normal limits  CBC - Abnormal; Notable for the following:    WBC 25.4 (*)    RBC 4.32 (*)    Hemoglobin 12.4 (*)    HCT 36.7 (*)    All other components within normal limits  LACTIC ACID, PLASMA - Abnormal; Notable for the following:    Lactic Acid, Venous  2.8 (*)    All other components within normal limits  GLUCOSE, CAPILLARY - Abnormal; Notable for the following:    Glucose-Capillary 433 (*)    All other components within normal limits  GLUCOSE, CAPILLARY - Abnormal; Notable for the following:    Glucose-Capillary 432 (*)    All other components within normal limits  CULTURE, BLOOD (ROUTINE X 2)  CULTURE, BLOOD (ROUTINE X 2)  URINALYSIS, COMPLETE (UACMP) WITH MICROSCOPIC  LACTIC ACID, PLASMA  URINALYSIS, ROUTINE W REFLEX MICROSCOPIC  CBG MONITORING, ED   ____________________________________________  EKG  ED ECG REPORT I, Doran Stabler, the attending physician, personally viewed and interpreted this ECG.   Date: 09/23/2016  EKG Time: 1930  Rate: 106  Rhythm: sinus tachycardia with PVC times one  Axis: normal  Intervals:none  ST&T Change: No ST segment elevation or depression. No abnormal T-wave inversions.  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 ____________________________________________   PROCEDURES  Procedure(s) performed:   Procedures  Critical Care performed:  CRITICAL CARE Performed by: Doran Stabler   Total critical care time: 35 minutes  Critical care time was exclusive of separately billable procedures and treating other patients.  Critical care was necessary to treat or prevent imminent or life-threatening deterioration.  Critical care was time spent personally by me on the following activities: development of treatment plan with patient and/or surrogate as well as nursing, discussions with consultants, evaluation of patient's response to treatment, examination of patient, obtaining history from patient or surrogate, ordering and performing treatments and interventions, ordering and review of laboratory studies, ordering and review of radiographic studies, pulse oximetry and re-evaluation of patient's  condition.   ____________________________________________   INITIAL IMPRESSION / ASSESSMENT AND PLAN / ED COURSE  Pertinent labs & imaging results that were available during my care of the patient were reviewed by me and considered in my medical decision making (see chart for details).  ----------------------------------------- 9:41 PM on 09/23/2016 -----------------------------------------  Patient received fluids for his elevated glucose. Sepsis alert called. Also discussed the case with Dr. Vickki Muff of podiatry will be coming to the hospital to evaluate the patient. Patient aware me permission to the hospital. Signed out to the medicine service, Dr. Ara Kussmaul.       ____________________________________________   FINAL CLINICAL IMPRESSION(S) / ED DIAGNOSES  Sepsis. Bilateral cellulitis of the feet. New-onset diabetes.    NEW MEDICATIONS STARTED DURING THIS VISIT:  New Prescriptions   No medications on file     Note:  This document was prepared using Dragon voice recognition software and may include unintentional dictation errors.     Orbie Pyo, MD 09/23/16 2141

## 2016-09-24 ENCOUNTER — Inpatient Hospital Stay: Payer: 59

## 2016-09-24 LAB — BLOOD CULTURE ID PANEL (REFLEXED)
Acinetobacter baumannii: NOT DETECTED
Candida albicans: NOT DETECTED
Candida glabrata: NOT DETECTED
Candida krusei: NOT DETECTED
Candida parapsilosis: NOT DETECTED
Candida tropicalis: NOT DETECTED
Enterobacter cloacae complex: NOT DETECTED
Enterobacteriaceae species: NOT DETECTED
Enterococcus species: NOT DETECTED
Escherichia coli: NOT DETECTED
Haemophilus influenzae: NOT DETECTED
Klebsiella oxytoca: NOT DETECTED
Klebsiella pneumoniae: NOT DETECTED
Listeria monocytogenes: NOT DETECTED
NEISSERIA MENINGITIDIS: NOT DETECTED
PROTEUS SPECIES: NOT DETECTED
PSEUDOMONAS AERUGINOSA: NOT DETECTED
SERRATIA MARCESCENS: NOT DETECTED
STAPHYLOCOCCUS AUREUS BCID: NOT DETECTED
STAPHYLOCOCCUS SPECIES: NOT DETECTED
STREPTOCOCCUS PNEUMONIAE: NOT DETECTED
STREPTOCOCCUS SPECIES: DETECTED — AB
Streptococcus agalactiae: DETECTED — AB
Streptococcus pyogenes: NOT DETECTED

## 2016-09-24 LAB — BLOOD GAS, ARTERIAL
Acid-Base Excess: 4.2 mmol/L — ABNORMAL HIGH (ref 0.0–2.0)
Bicarbonate: 28.4 mmol/L — ABNORMAL HIGH (ref 20.0–28.0)
FIO2: 0.32
O2 SAT: 90.4 %
PATIENT TEMPERATURE: 37
PO2 ART: 56 mmHg — AB (ref 83.0–108.0)
pCO2 arterial: 40 mmHg (ref 32.0–48.0)
pH, Arterial: 7.46 — ABNORMAL HIGH (ref 7.350–7.450)

## 2016-09-24 LAB — COMPREHENSIVE METABOLIC PANEL
ALBUMIN: 2.2 g/dL — AB (ref 3.5–5.0)
ALK PHOS: 167 U/L — AB (ref 38–126)
ALT: 40 U/L (ref 17–63)
ANION GAP: 13 (ref 5–15)
AST: 29 U/L (ref 15–41)
BILIRUBIN TOTAL: 1.1 mg/dL (ref 0.3–1.2)
BUN: 32 mg/dL — ABNORMAL HIGH (ref 6–20)
CALCIUM: 8.1 mg/dL — AB (ref 8.9–10.3)
CO2: 23 mmol/L (ref 22–32)
Chloride: 92 mmol/L — ABNORMAL LOW (ref 101–111)
Creatinine, Ser: 1.47 mg/dL — ABNORMAL HIGH (ref 0.61–1.24)
GFR, EST AFRICAN AMERICAN: 58 mL/min — AB (ref >60)
GFR, EST NON AFRICAN AMERICAN: 50 mL/min — AB (ref >60)
GLUCOSE: 460 mg/dL — AB (ref 65–99)
Potassium: 4.2 mmol/L (ref 3.5–5.1)
Sodium: 128 mmol/L — ABNORMAL LOW (ref 135–145)
TOTAL PROTEIN: 7.7 g/dL (ref 6.5–8.1)

## 2016-09-24 LAB — CBC
HCT: 30.2 % — ABNORMAL LOW (ref 40.0–52.0)
Hemoglobin: 10.2 g/dL — ABNORMAL LOW (ref 13.0–18.0)
MCH: 29.2 pg (ref 26.0–34.0)
MCHC: 33.9 g/dL (ref 32.0–36.0)
MCV: 86.1 fL (ref 80.0–100.0)
PLATELETS: 200 10*3/uL (ref 150–440)
RBC: 3.51 MIL/uL — AB (ref 4.40–5.90)
RDW: 13.9 % (ref 11.5–14.5)
WBC: 26.3 10*3/uL — AB (ref 3.8–10.6)

## 2016-09-24 LAB — GLUCOSE, CAPILLARY
GLUCOSE-CAPILLARY: 235 mg/dL — AB (ref 65–99)
GLUCOSE-CAPILLARY: 280 mg/dL — AB (ref 65–99)
GLUCOSE-CAPILLARY: 401 mg/dL — AB (ref 65–99)
GLUCOSE-CAPILLARY: 433 mg/dL — AB (ref 65–99)
GLUCOSE-CAPILLARY: 442 mg/dL — AB (ref 65–99)
Glucose-Capillary: 449 mg/dL — ABNORMAL HIGH (ref 65–99)
Glucose-Capillary: 406 mg/dL — ABNORMAL HIGH (ref 65–99)
Glucose-Capillary: 348 mg/dL — ABNORMAL HIGH (ref 65–99)
Glucose-Capillary: 266 mg/dL — ABNORMAL HIGH (ref 65–99)
Glucose-Capillary: 264 mg/dL — ABNORMAL HIGH (ref 65–99)

## 2016-09-24 LAB — PROTIME-INR
INR: 1.27
Prothrombin Time: 16 seconds — ABNORMAL HIGH (ref 11.4–15.2)

## 2016-09-24 LAB — PHOSPHORUS: Phosphorus: 3.7 mg/dL (ref 2.5–4.6)

## 2016-09-24 LAB — URINALYSIS, COMPLETE (UACMP) WITH MICROSCOPIC
Bacteria, UA: NONE SEEN
Bilirubin Urine: NEGATIVE
Glucose, UA: >=500 mg/dL — AB
Ketones, ur: 5 mg/dL — AB
Leukocytes, UA: NEGATIVE
Nitrite: NEGATIVE
Protein, ur: 100 mg/dL — AB
Specific Gravity, Urine: 1.02 (ref 1.005–1.030)
pH: 6 (ref 5.0–8.0)

## 2016-09-24 LAB — LACTIC ACID, PLASMA: Lactic Acid, Venous: 1.8 mmol/L (ref 0.5–1.9)

## 2016-09-24 LAB — APTT: APTT: 33 s (ref 24–36)

## 2016-09-24 LAB — MAGNESIUM: MAGNESIUM: 2 mg/dL (ref 1.7–2.4)

## 2016-09-24 MED ORDER — VANCOMYCIN HCL IN DEXTROSE 1-5 GM/200ML-% IV SOLN: 1000.0000 mg | Freq: Once | INTRAVENOUS | Status: DC

## 2016-09-24 MED ORDER — ENOXAPARIN SODIUM 40 MG/0.4ML ~~LOC~~ SOLN
40.0000 mg | SUBCUTANEOUS | Status: DC
  Administered 2016-09-25 – 2016-10-09 (×14): 40 mg via SUBCUTANEOUS
  Filled 2016-09-24 (×14): qty 0.4

## 2016-09-24 MED ORDER — DEXTROSE 5 % IV SOLN
2.0000 g | Freq: Three times a day (TID) | INTRAVENOUS | Status: DC
  Administered 2016-09-24 – 2016-09-25 (×3): 2 g via INTRAVENOUS
  Filled 2016-09-24 (×6): qty 2

## 2016-09-24 MED ORDER — SUGAMMADEX SODIUM 200 MG/2ML IV SOLN
INTRAVENOUS | Status: AC
  Filled 2016-09-24: qty 2

## 2016-09-24 MED ORDER — PHENYLEPHRINE HCL 10 MG/ML IJ SOLN
INTRAMUSCULAR | Status: DC | PRN
  Administered 2016-09-23: 100 ug via INTRAVENOUS
  Administered 2016-09-23: 50 ug via INTRAVENOUS

## 2016-09-24 MED ORDER — FENTANYL CITRATE (PF) 100 MCG/2ML IJ SOLN
25.0000 ug | INTRAMUSCULAR | Status: DC | PRN
Start: 2016-09-24 — End: 2016-09-24
  Administered 2016-09-24: 25 ug via INTRAVENOUS

## 2016-09-24 MED ORDER — METRONIDAZOLE 500 MG PO TABS
500.0000 mg | ORAL_TABLET | Freq: Three times a day (TID) | ORAL | Status: DC
  Administered 2016-09-24 – 2016-09-30 (×17): 500 mg via ORAL
  Filled 2016-09-24 (×18): qty 1

## 2016-09-24 MED ORDER — SODIUM CHLORIDE 0.9 % IV SOLN
INTRAVENOUS | Status: DC
  Administered 2016-09-24 – 2016-10-03 (×11): via INTRAVENOUS

## 2016-09-24 MED ORDER — ACETAMINOPHEN 325 MG PO TABS
650.0000 mg | ORAL_TABLET | Freq: Four times a day (QID) | ORAL | Status: DC | PRN
  Administered 2016-09-25 – 2016-09-26 (×2): 650 mg via ORAL
  Filled 2016-09-24 (×2): qty 2

## 2016-09-24 MED ORDER — FENTANYL CITRATE (PF) 100 MCG/2ML IJ SOLN
INTRAMUSCULAR | Status: AC
  Filled 2016-09-24: qty 2

## 2016-09-24 MED ORDER — BISACODYL 5 MG PO TBEC
5.0000 mg | DELAYED_RELEASE_TABLET | Freq: Every day | ORAL | Status: DC | PRN
  Administered 2016-09-29: 5 mg via ORAL
  Filled 2016-09-24: qty 1

## 2016-09-24 MED ORDER — MORPHINE SULFATE (PF) 2 MG/ML IV SOLN
2.0000 mg | INTRAVENOUS | Status: DC | PRN
  Administered 2016-09-25 – 2016-09-26 (×2): 2 mg via INTRAVENOUS
  Filled 2016-09-24 (×2): qty 1

## 2016-09-24 MED ORDER — ONDANSETRON HCL 4 MG/2ML IJ SOLN
INTRAMUSCULAR | Status: DC | PRN
  Administered 2016-09-24: 4 mg via INTRAVENOUS

## 2016-09-24 MED ORDER — INSULIN STARTER KIT- PEN NEEDLES (ENGLISH)
1.0000 | Freq: Once | Status: DC
  Filled 2016-09-24: qty 1

## 2016-09-24 MED ORDER — SODIUM CHLORIDE 0.9% FLUSH
3.0000 mL | Freq: Two times a day (BID) | INTRAVENOUS | Status: DC
  Administered 2016-09-27 – 2016-10-08 (×12): 3 mL via INTRAVENOUS

## 2016-09-24 MED ORDER — ACETAMINOPHEN 650 MG RE SUPP
650.0000 mg | Freq: Four times a day (QID) | RECTAL | Status: DC | PRN
  Administered 2016-09-24: 650 mg via RECTAL
  Filled 2016-09-24: qty 1

## 2016-09-24 MED ORDER — ONDANSETRON HCL 4 MG/2ML IJ SOLN
4.0000 mg | Freq: Four times a day (QID) | INTRAMUSCULAR | Status: DC | PRN
  Administered 2016-09-30 – 2016-10-06 (×2): 4 mg via INTRAVENOUS
  Filled 2016-09-24 (×2): qty 2

## 2016-09-24 MED ORDER — PIPERACILLIN-TAZOBACTAM 3.375 G IVPB 30 MIN: 3.3750 g | Freq: Once | INTRAVENOUS | Status: DC

## 2016-09-24 MED ORDER — PROMETHAZINE HCL 25 MG/ML IJ SOLN: 6.2500 mg | INTRAMUSCULAR | Status: DC | PRN

## 2016-09-24 MED ORDER — SUGAMMADEX SODIUM 200 MG/2ML IV SOLN
INTRAVENOUS | Status: DC | PRN
  Administered 2016-09-24: 200 mg via INTRAVENOUS

## 2016-09-24 MED ORDER — ALBUTEROL SULFATE (2.5 MG/3ML) 0.083% IN NEBU: 2.5000 mg | INHALATION_SOLUTION | Freq: Four times a day (QID) | RESPIRATORY_TRACT | Status: DC | PRN

## 2016-09-24 MED ORDER — BUPIVACAINE HCL 0.25 % IJ SOLN
INTRAMUSCULAR | Status: DC | PRN
  Administered 2016-09-24: 10 mL
  Administered 2016-09-24: 17 mL

## 2016-09-24 MED ORDER — INSULIN ASPART 100 UNIT/ML ~~LOC~~ SOLN
12.0000 [IU] | Freq: Once | SUBCUTANEOUS | Status: AC
  Administered 2016-09-24: 12 [IU] via SUBCUTANEOUS
  Filled 2016-09-24: qty 12

## 2016-09-24 MED ORDER — ENOXAPARIN SODIUM 40 MG/0.4ML ~~LOC~~ SOLN
40.0000 mg | SUBCUTANEOUS | Status: DC
  Administered 2016-09-24: 40 mg via SUBCUTANEOUS
  Filled 2016-09-24: qty 0.4

## 2016-09-24 MED ORDER — LIVING WELL WITH DIABETES BOOK
Freq: Once | Status: AC
  Administered 2016-09-24: 10:00:00
  Filled 2016-09-24: qty 1

## 2016-09-24 MED ORDER — IPRATROPIUM BROMIDE 0.02 % IN SOLN: 0.5000 mg | Freq: Four times a day (QID) | RESPIRATORY_TRACT | Status: DC | PRN

## 2016-09-24 MED ORDER — INSULIN ASPART 100 UNIT/ML ~~LOC~~ SOLN
0.0000 [IU] | SUBCUTANEOUS | Status: DC
  Administered 2016-09-24: 11 [IU] via SUBCUTANEOUS
  Administered 2016-09-24: 18 [IU] via SUBCUTANEOUS
  Administered 2016-09-24: 11 [IU] via SUBCUTANEOUS
  Administered 2016-09-24: 20 [IU] via SUBCUTANEOUS
  Administered 2016-09-24: 7 [IU] via SUBCUTANEOUS
  Administered 2016-09-24: 11 [IU] via SUBCUTANEOUS
  Administered 2016-09-25 (×2): 7 [IU] via SUBCUTANEOUS
  Administered 2016-09-25: 4 [IU] via SUBCUTANEOUS
  Administered 2016-09-25: 11 [IU] via SUBCUTANEOUS
  Administered 2016-09-25: 4 [IU] via SUBCUTANEOUS
  Administered 2016-09-26: 7 [IU] via SUBCUTANEOUS
  Administered 2016-09-26 (×3): 4 [IU] via SUBCUTANEOUS
  Administered 2016-09-26 – 2016-09-30 (×16): 3 [IU] via SUBCUTANEOUS
  Administered 2016-10-01 (×2): 4 [IU] via SUBCUTANEOUS
  Administered 2016-10-02 – 2016-10-03 (×5): 3 [IU] via SUBCUTANEOUS
  Administered 2016-10-03: 4 [IU] via SUBCUTANEOUS
  Administered 2016-10-03 – 2016-10-04 (×2): 3 [IU] via SUBCUTANEOUS
  Filled 2016-09-24: qty 4
  Filled 2016-09-24 (×2): qty 1
  Filled 2016-09-24: qty 4
  Filled 2016-09-24: qty 1
  Filled 2016-09-24: qty 20
  Filled 2016-09-24 (×2): qty 1
  Filled 2016-09-24: qty 4
  Filled 2016-09-24: qty 11
  Filled 2016-09-24 (×3): qty 1
  Filled 2016-09-24: qty 11
  Filled 2016-09-24: qty 3
  Filled 2016-09-24 (×8): qty 1
  Filled 2016-09-24: qty 7
  Filled 2016-09-24: qty 1
  Filled 2016-09-24: qty 7
  Filled 2016-09-24: qty 11
  Filled 2016-09-24 (×3): qty 1
  Filled 2016-09-24: qty 7
  Filled 2016-09-24 (×4): qty 1
  Filled 2016-09-24: qty 18
  Filled 2016-09-24 (×2): qty 1
  Filled 2016-09-24: qty 4
  Filled 2016-09-24 (×2): qty 1
  Filled 2016-09-24: qty 11

## 2016-09-24 MED ORDER — ONDANSETRON HCL 4 MG PO TABS
4.0000 mg | ORAL_TABLET | Freq: Four times a day (QID) | ORAL | Status: DC | PRN
  Administered 2016-09-27: 4 mg via ORAL
  Filled 2016-09-24 (×2): qty 1

## 2016-09-24 MED ORDER — CHLORHEXIDINE GLUCONATE 4 % EX LIQD: 60.0000 mL | Freq: Once | CUTANEOUS | Status: DC

## 2016-09-24 MED ORDER — ZOLPIDEM TARTRATE 5 MG PO TABS
5.0000 mg | ORAL_TABLET | Freq: Every evening | ORAL | Status: DC | PRN
  Administered 2016-09-27 – 2016-10-09 (×13): 5 mg via ORAL
  Filled 2016-09-24 (×14): qty 1

## 2016-09-24 MED ORDER — INSULIN GLARGINE 100 UNIT/ML ~~LOC~~ SOLN
15.0000 [IU] | SUBCUTANEOUS | Status: DC
  Administered 2016-09-24: 15 [IU] via SUBCUTANEOUS
  Filled 2016-09-24 (×2): qty 0.15

## 2016-09-24 MED ORDER — INSULIN ASPART 100 UNIT/ML ~~LOC~~ SOLN
3.0000 [IU] | Freq: Three times a day (TID) | SUBCUTANEOUS | Status: DC
  Administered 2016-09-24 – 2016-09-25 (×4): 3 [IU] via SUBCUTANEOUS
  Filled 2016-09-24 (×4): qty 3

## 2016-09-24 MED ORDER — OXYCODONE HCL 5 MG PO TABS
5.0000 mg | ORAL_TABLET | ORAL | Status: DC | PRN
  Administered 2016-09-24 – 2016-09-29 (×12): 5 mg via ORAL
  Administered 2016-09-29: 10 mg via ORAL
  Administered 2016-09-30 – 2016-10-03 (×10): 5 mg via ORAL
  Filled 2016-09-24 (×25): qty 1

## 2016-09-24 MED ORDER — MAGNESIUM CITRATE PO SOLN
1.0000 | Freq: Once | ORAL | Status: DC | PRN
  Filled 2016-09-24: qty 296

## 2016-09-24 MED ORDER — SODIUM CHLORIDE 0.9 % IR SOLN
Status: DC | PRN
  Administered 2016-09-24: 4000 mL

## 2016-09-24 MED ORDER — SENNOSIDES-DOCUSATE SODIUM 8.6-50 MG PO TABS
1.0000 | ORAL_TABLET | Freq: Every evening | ORAL | Status: DC | PRN
  Filled 2016-09-24: qty 1

## 2016-09-24 NOTE — Consult Note (Signed)
[facility] Infectious Disease     Reason for Consult: Diabetic foot infection    Referring Physician: Governor Specking Date of Admission:  09/23/2016   Active Problems:   Sepsis due to cellulitis [facility])  HPI: Kyle Banks is a 61 y.o. male with recently dxed DM admitted with bil foot pain and swelling. On admit wbc was 25 and temp up to 101. Xray showed gas in tissue. 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  Cultures are pending but gram stains mixed.  Started vanco and zosyn.    History reviewed. No pertinent past medical history. Past Surgical History:  Procedure Laterality Date  . AMPUTATION TOE Bilateral 09/23/2016   Procedure: AMPUTATION TOE;  Surgeon: Samara Deist, DPM;  Location: [facility] ORS;  Service: Podiatry;  Laterality: Bilateral;  . INCISION AND DRAINAGE Left 09/23/2016   Procedure: INCISION AND DRAINAGE;  Surgeon: Samara Deist, DPM;  Location: [facility] ORS;  Service: Podiatry;  Laterality: Left;  . JOINT REPLACEMENT     bilateral knee replacements miller and hower pt unsure of dates   Social History  Substance Use Topics  . Smoking status: Never Smoker  . Smokeless tobacco: Never Used  . Alcohol use No   History reviewed. No pertinent family history.  Allergies: No Known Allergies  Current antibiotics: Antibiotics Given (last 72 hours)    Date/Time Action Medication Dose Rate   09/23/16 2133 New Bag/Given   piperacillin-tazobactam (ZOSYN) IVPB 3.375 g 3.375 g 100 mL/hr   09/23/16 2147 New Bag/Given   vancomycin (VANCOCIN) IVPB 1000 mg/200 mL premix 1,000 mg 200 mL/hr   09/24/16 0455 New Bag/Given   piperacillin-tazobactam (ZOSYN) IVPB 3.375 g 3.375 g 12.5 mL/hr   09/24/16 0456 New Bag/Given   vancomycin (VANCOCIN) IVPB 1000 mg/200 mL premix 1,000 mg 200 mL/hr   09/24/16 1237 New Bag/Given   piperacillin-tazobactam (ZOSYN) IVPB 3.375 g 3.375 g 12.5 mL/hr      MEDICATIONS: . chlorhexidine  60 mL Topical Once  . [START ON 09/25/2016]  enoxaparin (LOVENOX) injection  40 mg Subcutaneous Q24H  . insulin aspart  0-20 Units Subcutaneous Q4H  . insulin aspart  3 Units Subcutaneous TID WC  . insulin glargine  15 Units Subcutaneous Q24H  . insulin starter kit- pen needles  1 kit Other Once  . sodium chloride flush  3 mL Intravenous Q12H    Review of Systems - 11 systems reviewed and negative per HPI   OBJECTIVE: Temp:  [97.8 F (36.6 C)-101.5 F (38.6 C)] 100.7 F (38.2 C) (06/11 1351) Pulse Rate:  [92-115] 96 (06/11 1322) Resp:  [0-22] 20 (06/11 1241) BP: (99-130)/(52-82) 118/66 (06/11 1241) SpO2:  [84 %-97 %] 94 % (06/11 1322) Weight:  [99.2 kg (218 lb 9.6 oz)-103.4 kg (228 lb)] 99.2 kg (218 lb 9.6 oz) (06/11 0218) Physical Exam  Constitutional: He is obese, lethargic, ill appearing HENT: anicteric Mouth/Throat: Oropharynx is clear and dry. No oropharyngeal exudate.  Cardiovascular: Tachycardia, regular Pulmonary/Chest: Effort normal and breath sounds normal.  Abdominal: Soft. Obese Bowel sounds are normal. He exhibits no distension. There is no tenderness.  Lymphadenopathy: He has no cervical adenopathy.  Neurological: He is alert and oriented to person, place, and time.  Skin: bil leg wrapped Psychiatric: sleepy  LABS: Results for orders placed or performed during the hospital encounter of 09/23/16 (from the past 48 hour(s))  Blood culture (routine x 2)     Status: None (Preliminary result)   Collection Time: 09/23/16  7:44 PM  Result Value Ref  Range   Specimen Description BLOOD LEFT ANTECUBITAL    Special Requests      BOTTLES DRAWN AEROBIC AND ANAEROBIC Blood Culture adequate volume   Culture NO GROWTH < 12 HOURS    Report Status PENDING   Blood culture (routine x 2)     Status: None (Preliminary result)   Collection Time: 09/23/16  7:44 PM  Result Value Ref Range   Specimen Description BLOOD RIGHT ANTECUBITAL    Special Requests      BOTTLES DRAWN AEROBIC AND ANAEROBIC Blood Culture adequate volume    Culture  Setup Time Organism ID to follow    Culture NO GROWTH < 12 HOURS    Report Status PENDING   Lactic acid, plasma     Status: Abnormal   Collection Time: 09/23/16  7:44 PM  Result Value Ref Range   Lactic Acid, Venous 2.8 (HH) 0.5 - 1.9 mmol/L    Comment: CRITICAL RESULT CALLED TO, READ BACK BY AND VERIFIED WITH LEIGH FERGUSON ON 09/23/16 AT 2033 [facility]   Basic metabolic panel     Status: Abnormal   Collection Time: 09/23/16  7:47 PM  Result Value Ref Range   Sodium 125 (L) 135 - 145 mmol/L   Potassium 3.5 3.5 - 5.1 mmol/L   Chloride 88 (L) 101 - 111 mmol/L   CO2 23 22 - 32 mmol/L   Glucose, Bld 452 (H) 65 - 99 mg/dL   BUN 32 (H) 6 - 20 mg/dL   Creatinine, Ser 1.38 (H) 0.61 - 1.24 mg/dL   Calcium 8.8 (L) 8.9 - 10.3 mg/dL   GFR calc non Af Amer 54 (L) >60 mL/min   GFR calc Af Amer >60 >60 mL/min    Comment: (NOTE) The eGFR has been calculated using the CKD EPI equation. This calculation has not been validated in all clinical situations. eGFR's persistently <60 mL/min signify possible Chronic Kidney Disease.    Anion gap 14 5 - 15  CBC     Status: Abnormal   Collection Time: 09/23/16  7:47 PM  Result Value Ref Range   WBC 25.4 (H) 3.8 - 10.6 K/uL   RBC 4.32 (L) 4.40 - 5.90 MIL/uL   Hemoglobin 12.4 (L) 13.0 - 18.0 g/dL   HCT 36.7 (L) 40.0 - 52.0 %   MCV 84.9 80.0 - 100.0 fL   MCH 28.6 26.0 - 34.0 pg   MCHC 33.7 32.0 - 36.0 g/dL   RDW 13.6 11.5 - 14.5 %   Platelets 221 150 - 440 K/uL  Glucose, capillary     Status: Abnormal   Collection Time: 09/23/16  7:47 PM  Result Value Ref Range   Glucose-Capillary 433 (H) 65 - 99 mg/dL  Glucose, capillary     Status: Abnormal   Collection Time: 09/23/16  7:48 PM  Result Value Ref Range   Glucose-Capillary 432 (H) 65 - 99 mg/dL  Glucose, capillary     Status: Abnormal   Collection Time: 09/23/16 10:38 PM  Result Value Ref Range   Glucose-Capillary 415 (H) 65 - 99 mg/dL  Glucose, capillary     Status: Abnormal   Collection  Time: 09/24/16  1:42 AM  Result Value Ref Range   Glucose-Capillary 449 (H) 65 - 99 mg/dL  Glucose, capillary     Status: Abnormal   Collection Time: 09/24/16  2:40 AM  Result Value Ref Range   Glucose-Capillary 442 (H) 65 - 99 mg/dL   Comment 1 Notify RN   Glucose, capillary  Status: Abnormal   Collection Time: 09/24/16  2:59 AM  Result Value Ref Range   Glucose-Capillary 433 (H) 65 - 99 mg/dL   Comment 1 Notify RN   Protime-INR     Status: Abnormal   Collection Time: 09/24/16  3:00 AM  Result Value Ref Range   Prothrombin Time 16.0 (H) 11.4 - 15.2 seconds   INR 1.27   APTT     Status: None   Collection Time: 09/24/16  3:00 AM  Result Value Ref Range   aPTT 33 24 - 36 seconds  Magnesium     Status: None   Collection Time: 09/24/16  3:00 AM  Result Value Ref Range   Magnesium 2.0 1.7 - 2.4 mg/dL  Phosphorus     Status: None   Collection Time: 09/24/16  3:00 AM  Result Value Ref Range   Phosphorus 3.7 2.5 - 4.6 mg/dL  Lactic acid, plasma     Status: None   Collection Time: 09/24/16  3:00 AM  Result Value Ref Range   Lactic Acid, Venous 1.8 0.5 - 1.9 mmol/L  CBC     Status: Abnormal   Collection Time: 09/24/16  3:07 AM  Result Value Ref Range   WBC 26.3 (H) 3.8 - 10.6 K/uL   RBC 3.51 (L) 4.40 - 5.90 MIL/uL   Hemoglobin 10.2 (L) 13.0 - 18.0 g/dL   HCT 30.2 (L) 40.0 - 52.0 %   MCV 86.1 80.0 - 100.0 fL   MCH 29.2 26.0 - 34.0 pg   MCHC 33.9 32.0 - 36.0 g/dL   RDW 13.9 11.5 - 14.5 %   Platelets 200 150 - 440 K/uL  Comprehensive metabolic panel     Status: Abnormal   Collection Time: 09/24/16  3:07 AM  Result Value Ref Range   Sodium 128 (L) 135 - 145 mmol/L   Potassium 4.2 3.5 - 5.1 mmol/L   Chloride 92 (L) 101 - 111 mmol/L   CO2 23 22 - 32 mmol/L   Glucose, Bld 460 (H) 65 - 99 mg/dL   BUN 32 (H) 6 - 20 mg/dL   Creatinine, Ser 1.47 (H) 0.61 - 1.24 mg/dL   Calcium 8.1 (L) 8.9 - 10.3 mg/dL   Total Protein 7.7 6.5 - 8.1 g/dL   Albumin 2.2 (L) 3.5 - 5.0 g/dL   AST  29 15 - 41 U/L   ALT 40 17 - 63 U/L   Alkaline Phosphatase 167 (H) 38 - 126 U/L   Total Bilirubin 1.1 0.3 - 1.2 mg/dL   GFR calc non Af Amer 50 (L) >60 mL/min   GFR calc Af Amer 58 (L) >60 mL/min    Comment: (NOTE) The eGFR has been calculated using the CKD EPI equation. This calculation has not been validated in all clinical situations. eGFR's persistently <60 mL/min signify possible Chronic Kidney Disease.    Anion gap 13 5 - 15  Glucose, capillary     Status: Abnormal   Collection Time: 09/24/16  4:51 AM  Result Value Ref Range   Glucose-Capillary 406 (H) 65 - 99 mg/dL   Comment 1 Notify RN   Glucose, capillary     Status: Abnormal   Collection Time: 09/24/16  7:52 AM  Result Value Ref Range   Glucose-Capillary 401 (H) 65 - 99 mg/dL   Comment 1 Call MD NNP PA CNM   Glucose, capillary     Status: Abnormal   Collection Time: 09/24/16  9:45 AM  Result Value Ref Range  Glucose-Capillary 348 (H) 65 - 99 mg/dL  Glucose, capillary     Status: Abnormal   Collection Time: 09/24/16 11:33 AM  Result Value Ref Range   Glucose-Capillary 280 (H) 65 - 99 mg/dL  Blood gas, arterial     Status: Abnormal   Collection Time: 09/24/16 12:27 PM  Result Value Ref Range   FIO2 0.32    Delivery systems NASAL CANNULA    pH, Arterial 7.46 (H) 7.350 - 7.450   pCO2 arterial 40 32.0 - 48.0 mmHg   pO2, Arterial 56 (L) 83.0 - 108.0 mmHg   Bicarbonate 28.4 (H) 20.0 - 28.0 mmol/L   Acid-Base Excess 4.2 (H) 0.0 - 2.0 mmol/L   O2 Saturation 90.4 %   Patient temperature 37.0    Collection site RIGHT RADIAL    Sample type ARTERIAL DRAW    Allens test (pass/fail) ARTERIAL DRAW (A) PASS  Urinalysis, Complete w Microscopic     Status: Abnormal   Collection Time: 09/24/16 12:59 PM  Result Value Ref Range   Color, Urine YELLOW (A) YELLOW   APPearance CLEAR (A) CLEAR   Specific Gravity, Urine 1.020 1.005 - 1.030   pH 6.0 5.0 - 8.0   Glucose, UA >=500 (A) NEGATIVE mg/dL   Hgb urine dipstick SMALL (A)  NEGATIVE   Bilirubin Urine NEGATIVE NEGATIVE   Ketones, ur 5 (A) NEGATIVE mg/dL   Protein, ur 100 (A) NEGATIVE mg/dL   Nitrite NEGATIVE NEGATIVE   Leukocytes, UA NEGATIVE NEGATIVE   RBC / HPF 0-5 0 - 5 RBC/hpf   WBC, UA 0-5 0 - 5 WBC/hpf   Bacteria, UA NONE SEEN NONE SEEN   Squamous Epithelial / LPF 0-5 (A) NONE SEEN   No components found for: ESR, C REACTIVE PROTEIN MICRO: Recent Results (from the past 720 hour(s))  Blood culture (routine x 2)     Status: None (Preliminary result)   Collection Time: 09/23/16  7:44 PM  Result Value Ref Range Status   Specimen Description BLOOD LEFT ANTECUBITAL  Final   Special Requests   Final    BOTTLES DRAWN AEROBIC AND ANAEROBIC Blood Culture adequate volume   Culture NO GROWTH < 12 HOURS  Final   Report Status PENDING  Incomplete  Blood culture (routine x 2)     Status: None (Preliminary result)   Collection Time: 09/23/16  7:44 PM  Result Value Ref Range Status   Specimen Description BLOOD RIGHT ANTECUBITAL  Final   Special Requests   Final    BOTTLES DRAWN AEROBIC AND ANAEROBIC Blood Culture adequate volume   Culture  Setup Time Organism ID to follow  Final   Culture NO GROWTH < 12 HOURS  Final   Report Status PENDING  Incomplete    IMAGING: Ct Head Wo Contrast  Result Date: 09/24/2016 CLINICAL DATA:  Altered mental status. EXAM: CT HEAD WITHOUT CONTRAST TECHNIQUE: Contiguous axial images were obtained from the base of the skull through the vertex without intravenous contrast. COMPARISON:  None. FINDINGS: Brain: No evidence of acute infarction, hemorrhage, hydrocephalus, extra-axial collection or mass lesion/mass effect. Vascular: No hyperdense vessel or unexpected calcification. Skull: Normal. Negative for fracture or focal lesion. Sinuses/Orbits: No acute finding. Other: None. IMPRESSION: Normal head CT. Electronically Signed   By: Marijo Conception, M.D.   On: 09/24/2016 13:22   Dg Foot 2 Views Left  Result Date: 09/23/2016 CLINICAL  DATA:  Drainage and swelling of the great toe EXAM: LEFT FOOT - 2 VIEW COMPARISON:  None. FINDINGS: Osteopenia  is present. No fracture or subluxation is seen. Soft tissue gas tracking along the dorsum of the foot and along the medial and lateral aspect of the first digit around the MTP joint. Possible small marginal erosion first DIP joint. Plantar soft tissue calcification and vascular calcification. IMPRESSION: 1. Soft tissue emphysema over the dorsum of the foot, and surrounding the first digit at the level of the distal metatarsal and proximal phalanx, concerning for necrotizing infection. 2. Lucent appearing first distal phalanx, cannot exclude osteomyelitis. Question of a small marginal erosion at the first PIP joint, joint space infection is a concern given the surrounding soft tissue findings. Electronically Signed   By: Donavan Foil M.D.   On: 09/23/2016 21:03   Dg Foot 2 Views Right  Result Date: 09/23/2016 CLINICAL DATA:  Swelling and drainage from the great toes on both feet EXAM: RIGHT FOOT - 2 VIEW COMPARISON:  None. FINDINGS: No fracture or malalignment. Soft tissue thickening and gas at the distal first digit and to a lesser extent the second digit. Irregular lucency and probable erosive change involving the mid and distal portion of the first distal phalanx. Plantar soft tissue calcification. IMPRESSION: 1. Soft tissue enlargement with small amount of soft tissue gas at the distal first digit. 2. Lucency and probable erosive change involving the distal first phalanx suspicious for osteomyelitis. Electronically Signed   By: Donavan Foil M.D.   On: 09/23/2016 20:59    Assessment:   Kyle Banks is a 61 y.o. male with bil great toe infections with gas noted in tissue, and wbc 25 and fever 101 on admit. Sugars were >400, cr elevated (unclear baseline).  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  Per op note on the L the infection extended back to dorsal midfoot and extensive debridement  was done.  Cultures are pending but given the extent of infection I think he will need IV abx for several weeks.  Recommendations Check esr, crp and A1c Continue vanco pending culture but if no MRSA found can dc To avoid nephrotoxic combo for vanco and zosyn will change to IV ceftazidime and flagyl for now.  Thank you very much for allowing me to participate in the care of this patient. Please call with questions.   Cheral Marker. Ola Spurr, MD

## 2016-09-24 NOTE — Progress Notes (Signed)
CH made a follow up with Pt. Pt talked about his health struggles and was grieving loss of his two toes which were amputated yesterday. CH encouraged the Pt, and was about the provided education on how to complete and AD when Pt asked to be excused to use bathroom. Pt asked [place] to visit him later. Ch will make a follow up visit with Pt as needed.    09/24/16 1300  Clinical Encounter Type  Visited With Patient;Patient and family together  Visit Type Initial  Referral From Nurse  Consult/Referral To Chaplain  Spiritual Encounters  Spiritual Needs Literature;Prayer

## 2016-09-24 NOTE — Progress Notes (Signed)
MD paged to notify of pt BS [phone]. Awaiting returned page

## 2016-09-24 NOTE — Progress Notes (Addendum)
Pharmacy Antibiotic Note  Kyle Banks is a 61 y.o. male admitted on 09/23/2016 with sepsis.  Pharmacy was consulted for Vancomycin and Zosyn. ID following. Zosyn has been discontinued. Pharmacy now consulted for Ceftazidime.   Plan: 1. Start Ceftazidime 2gm IV every 8hours.   2. Continue Vancomycin 1 gm IV Q12H, predicted trough 18 mcg/mL.  Pharmacy will continue to follow and adjust as needed to maintain trough 15 to 20 mcg/mL.  Vd 56.7 L, Ke 0.058 hr-1, T1/2 11.9 hr  Height: 5\' 11"  (180.3 cm) Weight: 218 lb 9.6 oz (99.2 kg) IBW/kg (Calculated) : 75.3  Temp (24hrs), Avg:99.1 F (37.3 C), Min:97.8 F (36.6 C), Max:101.5 F (38.6 C)   Recent Labs Lab 09/23/16 1944 09/23/16 1947 09/24/16 0300 09/24/16 0307  WBC  --  25.4*  --  26.3*  CREATININE  --  1.38*  --  1.47*  LATICACIDVEN 2.8*  --  1.8  --     Estimated Creatinine Clearance: 63.4 mL/min (A) (by C-G formula based on SCr of 1.47 mg/dL (H)).    No Known Allergies  Thank you for allowing pharmacy to be a part of this patient's care.  Kensly Bowmer M Craige Patel, Pharm.D., BCPS Clinical Pharmacist 09/24/2016 3:31 PM

## 2016-09-24 NOTE — Progress Notes (Signed)
Notified by PT of change in patient status with jerking movements, drift to right side when sitting; patient noted to have been incontinent of urine.  VS showed slight hypotension and temp of 101.5.  MD notified of change in status and new orders received.

## 2016-09-24 NOTE — Progress Notes (Signed)
Daily Progress Note   Subjective  - 1 Day Post-Op  F/u I & D with great toe amputation b/l feet.    Objective Vitals:   09/24/16 1322 09/24/16 1351 09/24/16 1519 09/24/16 1634  BP:    119/66  Pulse: 96   92  Resp:    18  Temp:  (!) 100.7 F (38.2 C) 98.4 F (36.9 C) 98.6 F (37 C)  TempSrc:  Oral Oral Oral  SpO2: 94%   93%  Weight:      Height:        Physical Exam: Right foot stable.  Minimal bloody drainage from amputation site.    Left foot with mild foul odor and continued mild purulence.  Soft tissue along dorsal metatarsal region is questionable, was area of purulence with gas on xray pre-op.    Laboratory CBC    Component Value Date/Time   WBC 26.3 (H) 09/24/2016 0307   HGB 10.2 (L) 09/24/2016 0307   HCT 30.2 (L) 09/24/2016 0307   PLT 200 09/24/2016 0307    BMET    Component Value Date/Time   NA 128 (L) 09/24/2016 0307   K 4.2 09/24/2016 0307   CL 92 (L) 09/24/2016 0307   CO2 23 09/24/2016 0307   GLUCOSE 460 (H) 09/24/2016 0307   BUN 32 (H) 09/24/2016 0307   CREATININE 1.47 (H) 09/24/2016 0307   CALCIUM 8.1 (L) 09/24/2016 0307   GFRNONAA 50 (L) 09/24/2016 0307   GFRAA 58 (L) 09/24/2016 0307   Results for orders placed or performed during the hospital encounter of 09/23/16  Blood culture (routine x 2)     Status: None (Preliminary result)   Collection Time: 09/23/16  7:44 PM  Result Value Ref Range Status   Specimen Description BLOOD LEFT ANTECUBITAL  Final   Special Requests   Final    BOTTLES DRAWN AEROBIC AND ANAEROBIC Blood Culture adequate volume   Culture  Setup Time   Final    GRAM POSITIVE COCCI IN BOTH AEROBIC AND ANAEROBIC BOTTLES CRITICAL RESULT CALLED TO, READ BACK BY AND VERIFIED WITH: JASON ROBBINS AT 8938 ON 09/24/2016 JJB    Culture GRAM POSITIVE COCCI  Final   Report Status PENDING  Incomplete  Blood culture (routine x 2)     Status: None (Preliminary result)   Collection Time: 09/23/16  7:44 PM  Result Value Ref Range Status    Specimen Description BLOOD RIGHT ANTECUBITAL  Final   Special Requests   Final    BOTTLES DRAWN AEROBIC AND ANAEROBIC Blood Culture adequate volume   Culture  Setup Time   Final    Organism ID to follow GRAM POSITIVE COCCI AEROBIC BOTTLE ONLY CRITICAL RESULT CALLED TO, READ BACK BY AND VERIFIED WITH: JASON ROBBINS AT 1017 ON 09/24/2016 JJB    Culture GRAM POSITIVE COCCI  Final   Report Status PENDING  Incomplete  Blood Culture ID Panel (Reflexed)     Status: Abnormal   Collection Time: 09/23/16  7:44 PM  Result Value Ref Range Status   Enterococcus species NOT DETECTED NOT DETECTED Final   Listeria monocytogenes NOT DETECTED NOT DETECTED Final   Staphylococcus species NOT DETECTED NOT DETECTED Final   Staphylococcus aureus NOT DETECTED NOT DETECTED Final   Streptococcus species DETECTED (A) NOT DETECTED Final    Comment: CRITICAL RESULT CALLED TO, READ BACK BY AND VERIFIED WITH: JASON ROBBINS AT 1608 ON 09/24/2016 JJB    Streptococcus agalactiae DETECTED (A) NOT DETECTED Final    Comment: CRITICAL  RESULT CALLED TO, READ BACK BY AND VERIFIED WITH: JASON ROBBINS AT 0932 ON 09/24/2016 JJB    Streptococcus pneumoniae NOT DETECTED NOT DETECTED Final   Streptococcus pyogenes NOT DETECTED NOT DETECTED Final   Acinetobacter baumannii NOT DETECTED NOT DETECTED Final   Enterobacteriaceae species NOT DETECTED NOT DETECTED Final   Enterobacter cloacae complex NOT DETECTED NOT DETECTED Final   Escherichia coli NOT DETECTED NOT DETECTED Final   Klebsiella oxytoca NOT DETECTED NOT DETECTED Final   Klebsiella pneumoniae NOT DETECTED NOT DETECTED Final   Proteus species NOT DETECTED NOT DETECTED Final   Serratia marcescens NOT DETECTED NOT DETECTED Final   Haemophilus influenzae NOT DETECTED NOT DETECTED Final   Neisseria meningitidis NOT DETECTED NOT DETECTED Final   Pseudomonas aeruginosa NOT DETECTED NOT DETECTED Final   Candida albicans NOT DETECTED NOT DETECTED Final   Candida glabrata  NOT DETECTED NOT DETECTED Final   Candida krusei NOT DETECTED NOT DETECTED Final   Candida parapsilosis NOT DETECTED NOT DETECTED Final   Candida tropicalis NOT DETECTED NOT DETECTED Final  Aerobic/Anaerobic Culture (surgical/deep wound)     Status: None (Preliminary result)   Collection Time: 09/24/16 12:34 AM  Result Value Ref Range Status   Specimen Description WOUND RIGHT GREAT TOE  Final   Special Requests NONE  Final   Gram Stain   Final    RARE WBC PRESENT, PREDOMINANTLY PMN RARE SQUAMOUS EPITHELIAL CELLS PRESENT ABUNDANT GRAM POSITIVE COCCI IN PAIRS MODERATE GRAM NEGATIVE RODS RARE GRAM POSITIVE RODS Performed at [facility], [place] [address] [place], [place] [address]    Culture PENDING  Incomplete   Report Status PENDING  Incomplete  Aerobic/Anaerobic Culture (surgical/deep wound)     Status: None (Preliminary result)   Collection Time: 09/24/16 12:35 AM  Result Value Ref Range Status   Specimen Description WOUND LEFT GREAT TOE  Final   Special Requests NONE  Final   Gram Stain   Final    FEW WBC PRESENT, PREDOMINANTLY PMN ABUNDANT GRAM NEGATIVE RODS IN PAIRS ABUNDANT GRAM NEGATIVE RODS FEW GRAM POSITIVE RODS Performed at [facility], [place] [address] [place], [place] [address]    Culture PENDING  Incomplete   Report Status PENDING  Incomplete    Assessment/Planning: Gas producing infection  DM with Neuropathy Osteomyelitis, s/p b/l great toe amputation. + blood culture with strep   Dressings changed.  Right stable.  Left still draining.  Will follow.    Samara Deist A  09/24/2016, 6:04 PM

## 2016-09-24 NOTE — Transfer of Care (Signed)
Immediate Anesthesia Transfer of Care Note  Patient: Kyle Banks  Procedure(s) Performed: Procedure(s): INCISION AND DRAINAGE (Left) AMPUTATION TOE (Bilateral)  Patient Location: PACU  Anesthesia Type:General  Level of Consciousness: sedated  Airway & Oxygen Therapy: Patient Spontanous Breathing and Patient connected to face mask oxygen  Post-op Assessment: Report given to RN and Post -op Vital signs reviewed and stable  Post vital signs: Reviewed and stable  Last Vitals:  Vitals:   09/23/16 2130 09/24/16 0112  BP: 120/72   Pulse:    Resp: (!) 22   Temp:  36.6 C    Last Pain:  Vitals:   09/23/16 1924  TempSrc: Oral  PainSc:          Complications: No apparent anesthesia complications

## 2016-09-24 NOTE — Anesthesia Post-op Follow-up Note (Cosign Needed)
Anesthesia QCDR form completed.        

## 2016-09-24 NOTE — Op Note (Signed)
Operative note   Surgeon:Dajsha Massaro Lawyer: None    Preop diagnosis: 1. Left great toe osteomyelitis with gas producing infection dorsal and medial left foot.  2. Osteomyelitis right great toe    Postop diagnosis: Same    Procedure: 1. Left great toe amputation MTPJ 2. Incision and drainage dorsal midfoot deep 3. Amputation right great toe MTPJ    EBL: 30 ML's    Anesthesia:local and general    Hemostasis: [place] tourniquet inflated to 200 mmHg for approximately 10 minutes on the right side and 20 minutes on the left side.    Specimen: Wound cultures of both infections as well as right and left great toes for pathology    Complications: None    Operative indications:Kyle Banks is an 61 y.o. that presents today for surgical intervention.  The risks/benefits/alternatives/complications have been discussed and consent has been given.    Procedure:  Patient was brought into the OR and placed on the operating table in thesupine position. After anesthesia was obtained the bilateral lower extremity was prepped and draped in usual sterile fashion.  Attention was initially directed to the right foot where after inflation of the tourniquet 2 semielliptical incisions were made coursed around the base of the proximal phalanx of the right great toe. Full-thickness incision was performed. Purulent drainage was noted at the level of the mid shaft of the proximal phalanx. Further dissection was taken back to the metatarsal phalangeal joint where tissue with was found to be normal at this level. The toe was then disarticulated at the metatarsophalangeal joint. This was sent for pathological examination and a deep wound culture was performed at this time. All bleeders were Bovie cauterized. The wound was flushed with a liter of saline with a pulse lavage. Closure was performed with 3-0 nylon and packed with Iodosorb dressing to the wound. A sterile bandage was applied overlying this  area.  Attention was then directed to the left foot where 2 semielliptical incisions were made at the level of the mid shaft of the proximal phalanx. Marked infection was noted at this level. Further dissection was carried back to the metatarsophalangeal joint and the toe was disarticulated at this level. A deep culture was performed. There was noted to be necrotic soft tissue on the dorsal and plantar aspect of the amputation site. Further incision was taken back along the mid shaft of the metatarsal. Infection was noted be progressing to the dorsal midfoot. This was probed and all infection was drained. This was to the level of about the navicular cuneiform region on the dorsal midfoot. This wound was then flushed with 3 L of saline with a pulse lavage. The residual soft tissue that appeared to be grossly necrotic or infected was excisionally debrided to bone with the versa jet. All bleeders were Bovie cauterized. Tourniquet was dropped and closure was performed with a 3-0 nylon. This wound was also packed with iodoform dressing with a large bulky sterile dressing to all areas.    Patient tolerated the procedure and anesthesia well.  Was transported from the OR to the PACU with all vital signs stable and vascular status intact. To be discharged per routine protocol.

## 2016-09-24 NOTE — Progress Notes (Addendum)
PT Hold Note  Patient Details Name: Raeshaun Simson MRN: [id number] DOB: [birth year]   Hold Treatment:    Reason Eval/Treat Not Completed: Medical issues which prohibited therapy. Chart reviewed. Attempted PT evaluation earlier in AM however pt does not have post-op shoes. Post-op shoes ordered by charge RN. Returned at 12:08 and PT evaluation started. Pt is very lethargic during When attempted to get pt up to EOB noted that pt has urinated in bed. He continually falls asleep during evaluation. Once upright at EOB Pt falling continually to the R and is unable to remain in sitting. Pt having myclonic jerks in bilateral UE. RN called and arrives to assess. Pt appears to have intact strength in bilateral UE/LE. Drooping noted in R corner of mouth. Pt is complaining of dizziness. Patient's gaze continues to drift to the right and with finger follow has significant nystagmus. RN paged MD who ordered head CT and ABG. PT evaluation terminated. Will follow and attempt PT evaluation when pt is medically stabilized.   Lyndel Safe Jaymari Cromie PT, DPT   Threasa Kinch 09/24/2016, 12:59 PM

## 2016-09-24 NOTE — Progress Notes (Addendum)
Inpatient Diabetes Program Recommendations  AACE/ADA: New Consensus Statement on Inpatient Glycemic Control (2015)  Target Ranges:  Prepandial:   less than 140 mg/dL      Peak postprandial:   less than 180 mg/dL (1-2 hours)      Critically ill patients:  140 - 180 mg/dL   Results for AVAN, GULLETT (MRN [id number]) as of 09/24/2016 07:47  Ref. Range 09/23/2016 19:47 09/23/2016 19:48 09/23/2016 22:38 09/24/2016 01:42 09/24/2016 02:40 09/24/2016 02:59 09/24/2016 04:51  Glucose-Capillary Latest Ref Range: 65 - 99 mg/dL 433 (H) 432 (H) 415 (H) 449 (H) 442 (H) 433 (H) 406 (H)   Review of Glycemic Control  Diabetes history: NO Outpatient Diabetes medications: None Current orders for Inpatient glycemic control: Novolog 0-20 units Q4H  Inpatient Diabetes Program Recommendations: Insulin - Basal: Please consider ordering Lantus 15 units Q24H (starting now). Correction (SSI): If patient is eating please consider changing frequency of CBGs and Novolog 0-20 units ACHS. Insulin - Meal Coverage: Please consider ordering Novolog 3 units TID with meals for meal coverage if patient eats at least 50% of meals.  NOTE: Noted Consult for Diabetes Coordinator for new onset DM dx. Diabetes Coordinator is not on [place] today and assessing patient's remotely. Ordered: Living Well with Diabetes booklet, insulin starter kit, patient education by RNs, and RD consult for new DM dx. Will plan to call patient this afternoon to discuss new DM dx and will have Diabetes Coordinator see patient face to face on 09/25/16 for follow up and to reinforce DM education. Talked with Eustaquio Maize, RN and asked that bedside nursing begin educating patient.  Addendum 09/24/16@13 :30-Due to change in patient's status, not appropriate for DM education at this time. Will plan for Diabetes Coordinator to follow up with patient on 09/25/16.  Thanks, Barnie Alderman, RN, MSN, CDE Diabetes Coordinator Inpatient Diabetes Program [phone] (Team Pager from  8am to 5pm)

## 2016-09-24 NOTE — Plan of Care (Signed)
Problem: Food- and Nutrition-Related Knowledge Deficit (NB-1.1) Goal: Nutrition education Formal process to instruct or train a patient/client in a skill or to impart knowledge to help patients/clients voluntarily manage or modify food choices and eating behavior to maintain or improve health. Outcome: Completed/Met Date Met: 09/24/16  RD consulted for nutrition education regarding diabetes.   No results found for: HGBA1C  Still pending HgbA1c. CBGs 280-449 past 24 hrs  Spoke with patient at bedside. He had limited participation in education even though he told RD he wanted to talk and reported that it was a good time for education. He reports he eats 3 meals per day. Reports he has been dieting lately. His breakfast is bacon, eggs, and orange juice. Has a snack of cottage cheese with peaches middle of the day. Reports eating "lettuce and peaches" for dinner. Discussed that patient would benefit from regular intake of carbohydrates throughout the day.   RD provided "Carbohydrate Counting for People with Diabetes" handout from the Academy of Nutrition and Dietetics. Discussed different food groups and their effects on blood sugar, emphasizing carbohydrate-containing foods. Provided list of carbohydrates and recommended serving sizes of common foods.  RD provided "Using Nutrition Labels: Carbohydrates" handout from the Academy of Nutrition and Dietetics. Discussed how to read a nutrition label including looking at serving size and servings per container. Reviewed that there are 15 grams of carbohydrate in one carbohydrate choice. Encouraged patient to use chart for range of carbohydrate grams per choice when reading nutrition labels.  Discussed importance of controlled and consistent carbohydrate intake throughout the day. Provided examples of ways to balance meals/snacks and encouraged intake of high-fiber, whole grain complex carbohydrates. Teach back method used.  Expect fair compliance. Per  patient's teach back, did not have very good understanding of education. Made referral for outpatient counseling with RD to promote lifestyle changes.  Body mass index is 30.49 kg/m. Pt meets criteria for Obesity Class I based on current BMI.  Current diet order is Heart Healthy/Carbohydrate Modified, patient is consuming approximately 50-100% of meals at this time. Labs and medications reviewed. No further nutrition interventions warranted at this time. RD contact information provided. If additional nutrition issues arise, please re-consult RD.  Willey Blade, MS, RD, LDN Pager: [phone] After Hours Pager: [phone]

## 2016-09-24 NOTE — Progress Notes (Signed)
[place] [facility] [place] NAME: Kyle Banks    MR#:  [id number]  DATE OF BIRTH:  [birth year]  SUBJECTIVE: Admitted for bilateral foot infection with gas gangrene and sepsis. Found  to have osteomyelitis of left great toe, right great toe status post amputation of right and left great toes. And has newly diagnosed diabetes mellitus and blood sugars are ranging in 400s range patient has no PCP and does not have any other medical problems before.   CHIEF COMPLAINT:   Chief Complaint  Patient presents with  . Dizziness  . Wound Infection    REVIEW OF SYSTEMS:   ROS CONSTITUTIONAL: No fever, fatigue or weakness.  EYES: No blurred or double vision.  EARS, NOSE, AND THROAT: No tinnitus or ear pain.  RESPIRATORY: No cough, shortness of breath, wheezing or hemoptysis.  CARDIOVASCULAR: No chest pain, orthopnea, edema.  GASTROINTESTINAL: No nausea, vomiting, diarrhea or abdominal pain.  GENITOURINARY: No dysuria, hematuria.  ENDOCRINE: No polyuria, nocturia,  HEMATOLOGY: No anemia, easy bruising or bleeding SKIN: No rash or lesion. MUSCULOSKELETAL:Status post amputation  Of both great toes.  NEUROLOGIC: No tingling, numbness, weakness.  PSYCHIATRY: No anxiety or depression.   DRUG ALLERGIES:  No Known Allergies  VITALS:  Blood pressure 108/77, pulse 96, temperature 98.8 F (37.1 C), temperature source Oral, resp. rate 18, height 5\' 11"  (1.803 m), weight 99.2 kg (218 lb 9.6 oz), SpO2 97 %.  PHYSICAL EXAMINATION:  GENERAL:  61 y.o.-year-old patient lying in the bed with no acute distress.  EYES: Pupils equal, round, reactive to light and  accommodation. No scleral icterus. Extraocular muscles intact.  HEENT: Head atraumatic, normocephalic. Oropharynx and nasopharynx clear.  NECK:  Supple, no jugular venous distention. No thyroid enlargement, no tenderness.  LUNGS: Normal breath sounds bilaterally, no wheezing, rales,rhonchi or crepitation.  No use of accessory muscles of respiration.  CARDIOVASCULAR: S1, S2 normal. No murmurs, rubs, or gallops.  ABDOMEN: Soft, nontender, nondistended. Bowel sounds present. No organomegaly or mass.  EXTREMITIES;Dressing present to both feet. NEUROLOGIC: Cranial nerves II through XII are intact. Muscle strength 5/5 in all extremities. Sensation intact. Gait not checked.  PSYCHIATRIC: The patient is alert and oriented x 3.  SKIN: No obvious rash, lesion, or ulcer.    LABORATORY PANEL:   CBC  Recent Labs Lab 09/24/16 0307  WBC 26.3*  HGB 10.2*  HCT 30.2*  PLT 200   ------------------------------------------------------------------------------------------------------------------  Chemistries   Recent Labs Lab 09/24/16 0300 09/24/16 0307  NA  --  128*  K  --  4.2  CL  --  92*  CO2  --  23  GLUCOSE  --  460*  BUN  --  32*  CREATININE  --  1.47*  CALCIUM  --  8.1*  MG 2.0  --   AST  --  29  ALT  --  40  ALKPHOS  --  167*  BILITOT  --  1.1   ------------------------------------------------------------------------------------------------------------------  Cardiac Enzymes No results for input(s): TROPONINI in the last 168 hours. ------------------------------------------------------------------------------------------------------------------  RADIOLOGY:  Dg Foot 2 Views Left  Result Date: 09/23/2016 CLINICAL DATA:  Drainage and swelling of the great toe EXAM: LEFT FOOT - 2 VIEW COMPARISON:  None. FINDINGS: Osteopenia is present. No fracture or subluxation is seen. Soft tissue gas tracking along the dorsum of the foot and along the medial and lateral aspect of the first digit around the MTP joint. Possible small marginal erosion first DIP joint. Plantar soft tissue  calcification and vascular calcification. IMPRESSION: 1. Soft tissue emphysema over the dorsum of the foot, and surrounding the first digit at the level of the distal metatarsal and proximal phalanx, concerning for  necrotizing infection. 2. Lucent appearing first distal phalanx, cannot exclude osteomyelitis. Question of a small marginal erosion at the first PIP joint, joint space infection is a concern given the surrounding soft tissue findings. Electronically Signed   By: Donavan Foil M.D.   On: 09/23/2016 21:03   Dg Foot 2 Views Right  Result Date: 09/23/2016 CLINICAL DATA:  Swelling and drainage from the great toes on both feet EXAM: RIGHT FOOT - 2 VIEW COMPARISON:  None. FINDINGS: No fracture or malalignment. Soft tissue thickening and gas at the distal first digit and to a lesser extent the second digit. Irregular lucency and probable erosive change involving the mid and distal portion of the first distal phalanx. Plantar soft tissue calcification. IMPRESSION: 1. Soft tissue enlargement with small amount of soft tissue gas at the distal first digit. 2. Lucency and probable erosive change involving the distal first phalanx suspicious for osteomyelitis. Electronically Signed   By: Donavan Foil M.D.   On: 09/23/2016 20:59    EKG:   Orders placed or performed during the hospital encounter of 09/23/16  . ED EKG  . ED EKG  . EKG 12-Lead    ASSESSMENT AND PLAN:  #1 sepsis with osteomyelitis of left and right great toe, 'status post amputation of both great toes by podiatry yesterday, continue aggressive antibiotics with vancomycin, Zosyn, IV fluids, follow blood cultures, wound cultures, obtain ID consult,  #2. Diabetes mellitus type 2 uncontrolled: Patient has no PCP. Blood sugars ranging in 400s. Seen by diabetes coordinator, started on Lantus, NovoLog 3 units 3 times a day, add hemoglobin A1c, living well with diabetes book is given, I explained the patient that patient needs PCP and also education about diabetes, we will give him teaching about food choices. 3. hyponatremia and acute kidney injury: Continue IV hydration, watch closely. AG is 13. D/w RN  All the records are reviewed and case discussed  with Care Management/Social Workerr. Management plans discussed with the patient, family and they are in agreement.  CODE STATUS: full  TOTAL TIME TAKING CARE OF THIS PATIENT: 62minutes.   POSSIBLE D/C IN 1-2 DAYS, DEPENDING ON CLINICAL CONDITION.   Epifanio Lesches M.D on 09/24/2016 at 8:32 AM  Between 7am to 6pm - Pager - [phone]  After 6pm go to [email or web address] - password [email or web address] [facility]  Office  [phone]  CC: Primary care physician; Patient, No Pcp Per   Note: This dictation was prepared with Dragon dictation along with smaller phrase technology. Any transcriptional errors that result from this process are unintentional.

## 2016-09-24 NOTE — Progress Notes (Signed)
Informed Anesthesiologist CBG at bedside 449.  No orders received.  Pt lying quietly in bed.  NAD, VSS.  Will continue to monitor.

## 2016-09-24 NOTE — Progress Notes (Signed)
Family to 150 and pt stil in pacu, MD fowler had already been to room to speak with family, family wanting to speak with MD. Doree Barthel called MD had alreadyleft cell phone number given and called MD talking to MD at this time.

## 2016-09-24 NOTE — Progress Notes (Signed)
Spoke with Dr. Vianne Bulls regarding patient's elevated blood glucose.  New orders entered.

## 2016-09-24 NOTE — Anesthesia Procedure Notes (Signed)
Procedure Name: Intubation Date/Time: 09/24/2016 11:35 PM Performed by: Allean Found Pre-anesthesia Checklist: Patient identified, Emergency Drugs available, Suction available, Patient being monitored and Timeout performed Patient Re-evaluated:Patient Re-evaluated prior to inductionOxygen Delivery Method: Circle system utilized Preoxygenation: Pre-oxygenation with 100% oxygen Intubation Type: IV induction Ventilation: Mask ventilation without difficulty Laryngoscope Size: Mac and 4 Grade View: Grade I Tube type: Oral Tube size: 7.5 mm Number of attempts: 1 Airway Equipment and Method: Stylet Placement Confirmation: ETT inserted through vocal cords under direct vision,  positive ETCO2 and breath sounds checked- equal and bilateral Secured at: 23 cm Tube secured with: Tape

## 2016-09-24 NOTE — Progress Notes (Signed)
PHARMACY - PHYSICIAN COMMUNICATION CRITICAL VALUE ALERT - BLOOD CULTURE IDENTIFICATION (BCID)  Results for orders placed or performed during the hospital encounter of 09/23/16  Blood Culture ID Panel (Reflexed) (Collected: 09/23/2016  7:44 PM)  Result Value Ref Range   Enterococcus species NOT DETECTED NOT DETECTED   Listeria monocytogenes NOT DETECTED NOT DETECTED   Staphylococcus species NOT DETECTED NOT DETECTED   Staphylococcus aureus NOT DETECTED NOT DETECTED   Streptococcus species DETECTED (A) NOT DETECTED   Streptococcus agalactiae DETECTED (A) NOT DETECTED   Streptococcus pneumoniae NOT DETECTED NOT DETECTED   Streptococcus pyogenes NOT DETECTED NOT DETECTED   Acinetobacter baumannii NOT DETECTED NOT DETECTED   Enterobacteriaceae species NOT DETECTED NOT DETECTED   Enterobacter cloacae complex NOT DETECTED NOT DETECTED   Escherichia coli NOT DETECTED NOT DETECTED   Klebsiella oxytoca NOT DETECTED NOT DETECTED   Klebsiella pneumoniae NOT DETECTED NOT DETECTED   Proteus species NOT DETECTED NOT DETECTED   Serratia marcescens NOT DETECTED NOT DETECTED   Haemophilus influenzae NOT DETECTED NOT DETECTED   Neisseria meningitidis NOT DETECTED NOT DETECTED   Pseudomonas aeruginosa NOT DETECTED NOT DETECTED   Candida albicans NOT DETECTED NOT DETECTED   Candida glabrata NOT DETECTED NOT DETECTED   Candida krusei NOT DETECTED NOT DETECTED   Candida parapsilosis NOT DETECTED NOT DETECTED   Candida tropicalis NOT DETECTED NOT DETECTED    Name of physician (or Provider) Contacted: Dr. Ola Spurr  Changes to prescribed antibiotics required: No changes at this tim per Dr. Ola Spurr. Continue vancomycin until wound cultures result.   Pernell Dupre, PharmD, BCPS Clinical Pharmacist 09/24/2016 5:24 PM

## 2016-09-25 LAB — BASIC METABOLIC PANEL
Anion gap: 7 (ref 5–15)
BUN: 20 mg/dL (ref 6–20)
CHLORIDE: 98 mmol/L — AB (ref 101–111)
CO2: 27 mmol/L (ref 22–32)
CREATININE: 1.06 mg/dL (ref 0.61–1.24)
Calcium: 8 mg/dL — ABNORMAL LOW (ref 8.9–10.3)
GFR calc non Af Amer: >60 mL/min (ref >60)
Glucose, Bld: 202 mg/dL — ABNORMAL HIGH (ref 65–99)
POTASSIUM: 3.5 mmol/L (ref 3.5–5.1)
Sodium: 132 mmol/L — ABNORMAL LOW (ref 135–145)

## 2016-09-25 LAB — CBC
HEMATOCRIT: 27.5 % — AB (ref 40.0–52.0)
HEMOGLOBIN: 9.2 g/dL — AB (ref 13.0–18.0)
MCH: 28.6 pg (ref 26.0–34.0)
MCHC: 33.5 g/dL (ref 32.0–36.0)
MCV: 85.3 fL (ref 80.0–100.0)
Platelets: 198 10*3/uL (ref 150–440)
RBC: 3.23 MIL/uL — AB (ref 4.40–5.90)
RDW: 14.1 % (ref 11.5–14.5)
WBC: 19.7 10*3/uL — ABNORMAL HIGH (ref 3.8–10.6)

## 2016-09-25 LAB — VANCOMYCIN, TROUGH: Vancomycin Tr: 10 ug/mL — ABNORMAL LOW (ref 15–20)

## 2016-09-25 LAB — GLUCOSE, CAPILLARY
GLUCOSE-CAPILLARY: 135 mg/dL — AB (ref 65–99)
GLUCOSE-CAPILLARY: 153 mg/dL — AB (ref 65–99)
GLUCOSE-CAPILLARY: 200 mg/dL — AB (ref 65–99)
GLUCOSE-CAPILLARY: 228 mg/dL — AB (ref 65–99)
Glucose-Capillary: 277 mg/dL — ABNORMAL HIGH (ref 65–99)
Glucose-Capillary: 216 mg/dL — ABNORMAL HIGH (ref 65–99)

## 2016-09-25 LAB — LIPID PANEL
CHOLESTEROL: 167 mg/dL (ref 0–200)
HDL: <10 mg/dL — ABNORMAL LOW (ref >40)
LDL CALC: UNDETERMINED mg/dL (ref 0–99)
TRIGLYCERIDES: 431 mg/dL — AB (ref <150)
VLDL: UNDETERMINED mg/dL (ref 0–40)

## 2016-09-25 LAB — SEDIMENTATION RATE: Sed Rate: 107 mm/hr — ABNORMAL HIGH (ref 0–20)

## 2016-09-25 LAB — HEMOGLOBIN A1C
HEMOGLOBIN A1C: 15.1 % — AB (ref 4.8–5.6)
HEMOGLOBIN A1C: 15.3 % — AB (ref 4.8–5.6)
MEAN PLASMA GLUCOSE: 392 mg/dL
Mean Plasma Glucose: 387 mg/dL

## 2016-09-25 LAB — C-REACTIVE PROTEIN: CRP: 35.6 mg/dL — AB (ref <1.0)

## 2016-09-25 LAB — HIV ANTIBODY (ROUTINE TESTING W REFLEX): HIV SCREEN 4TH GENERATION: NONREACTIVE

## 2016-09-25 MED ORDER — INSULIN ASPART 100 UNIT/ML ~~LOC~~ SOLN
5.0000 [IU] | Freq: Three times a day (TID) | SUBCUTANEOUS | Status: DC
  Administered 2016-09-25 – 2016-10-10 (×32): 5 [IU] via SUBCUTANEOUS
  Filled 2016-09-25 (×10): qty 1
  Filled 2016-09-25: qty 5
  Filled 2016-09-25 (×2): qty 1
  Filled 2016-09-25: qty 5
  Filled 2016-09-25 (×17): qty 1

## 2016-09-25 MED ORDER — INSULIN GLARGINE 100 UNIT/ML ~~LOC~~ SOLN
25.0000 [IU] | SUBCUTANEOUS | Status: DC
  Administered 2016-09-26: 25 [IU] via SUBCUTANEOUS
  Filled 2016-09-25: qty 0.25

## 2016-09-25 MED ORDER — INSULIN GLARGINE 100 UNIT/ML ~~LOC~~ SOLN
17.0000 [IU] | SUBCUTANEOUS | Status: DC
  Administered 2016-09-25: 17 [IU] via SUBCUTANEOUS
  Filled 2016-09-25: qty 0.17

## 2016-09-25 MED ORDER — CEFTAZIDIME AND DEXTROSE 2 GM/50ML IV SOLR
2.0000 g | Freq: Three times a day (TID) | INTRAVENOUS | Status: DC
  Filled 2016-09-25 (×3): qty 50

## 2016-09-25 MED ORDER — FENOFIBRATE 160 MG PO TABS
160.0000 mg | ORAL_TABLET | Freq: Every day | ORAL | Status: DC
  Administered 2016-09-25 – 2016-10-10 (×15): 160 mg via ORAL
  Filled 2016-09-25 (×15): qty 1

## 2016-09-25 MED ORDER — VANCOMYCIN HCL 10 G IV SOLR
1250.0000 mg | Freq: Two times a day (BID) | INTRAVENOUS | Status: DC
  Filled 2016-09-25 (×2): qty 1250

## 2016-09-25 MED ORDER — CEFTAZIDIME AND DEXTROSE 2 GM/50ML IV SOLR: 2.0000 g | Freq: Three times a day (TID) | INTRAVENOUS | Status: DC

## 2016-09-25 MED ORDER — DEXTROSE 5 % IV SOLN
2.0000 g | Freq: Three times a day (TID) | INTRAVENOUS | Status: AC
  Administered 2016-09-25 – 2016-09-26 (×3): 2 g via INTRAVENOUS
  Filled 2016-09-25 (×4): qty 2

## 2016-09-25 MED ORDER — VANCOMYCIN HCL 10 G IV SOLR
1500.0000 mg | Freq: Two times a day (BID) | INTRAVENOUS | Status: DC
  Administered 2016-09-25 – 2016-09-28 (×5): 1500 mg via INTRAVENOUS
  Filled 2016-09-25 (×7): qty 1500

## 2016-09-25 NOTE — Evaluation (Signed)
Physical Therapy Evaluation Patient Details Name: Kyle Banks MRN: [id number] DOB: [birth year] Today's Date: 09/25/2016   History of Present Illness  Kyle Banks is a 61 y.o. male with no known medical history presents to the emergency department for evaluation of foot wounds. Patient was in a usual state of health until 3 days ago when he describes the sudden onset of redness, pain, swelling and discharge from his feet bilaterally. He states prior to this infection his feet appeared normal. Patient is unaware of any medical problems such as diabetes and states that he has had annual physicals and blood work at various urgent care centers. Patient denies fevers/chills, weakness, dizziness, chest pain, shortness of breath, N/V/C/D, abdominal pain, dysuria/frequency, changes in mental status. Otherwise, there has been no change in status. Patient has been taking medication as prescribed and there has been no recent change in medication or diet.  No recent antibiotics.  There has been no recent illness, hospitalizations, travel or sick contacts. 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 Attempted to peform PT evaluation on 09/24/16 however pt with myoclonic jerks, dizziness, and facial droop when attempting ambulation. Head CT negative for acute infarct.  Clinical Impression  Pt admitted with above diagnosis. Pt currently with functional limitations due to the deficits listed below (see PT Problem List).  Pt is AOx4 at time of PT evaluation today. He still presents with drooping of corner of R side of mouth today. RN and charge RN both notified and confirm after arriving to assist therapist. His speech sounds mildly slurred but pt does not believe that his speech is slurred. Otherwise no focal weakness or sensory deficits in bilateral UE/LEs. Pt requiring minA+1 for bed mobility and modA+1 progressing to minA+1 for transfers. Pt  unsteady in standing and falls backwards. Attempted marching initially and pt unable to perform. However with extended time in standing pt able to initiate marching and take side steps up toward [place]. Too unsafe to perform forward ambulation away from bed due to instability. Pt currently will need SNF placement at discharge. Pt will benefit from skilled PT services to address deficits in strength, balance, and mobility in order to return to full function at home.      Follow Up Recommendations SNF    Equipment Recommendations  Rolling walker with 5" wheels    Recommendations for Other Services       Precautions / Restrictions Precautions Precautions: Fall Restrictions Weight Bearing Restrictions: No      Mobility  Bed Mobility Overal bed mobility: Needs Assistance Bed Mobility: Supine to Sit;Sit to Supine     Supine to sit: Min assist Sit to supine: Modified independent (Device/Increase time)   General bed mobility comments: Pt requires minA+1 to go from R sidelying to sitting. Once upright at EOB pt is more stable today in sitting than yesterday. Denies dizziness  Transfers Overall transfer level: Needs assistance Equipment used: Rolling walker (2 wheeled) Transfers: Sit to/from Stand Sit to Stand: Mod assist         General transfer comment: Pt initially requiring modA+1 to standing. However with repeated transfers he only requires minA+1 to stabilize. Pt unsteady in standing and falls backwards. Attempted marching initially and pt unable to perform. However with extended time in standing pt able to initiate marching and take side steps up toward [place]. Too unsafe to perform forward ambulation away from bed due to instability  Ambulation/Gait  General Gait Details: Unsafe to attempt at this time  Stairs            Wheelchair Mobility    Modified Rankin (Stroke Patients Only)       Balance Overall balance assessment: Needs  assistance Sitting-balance support: No upper extremity supported Sitting balance-Leahy Scale: Fair     Standing balance support: Bilateral upper extremity supported Standing balance-Leahy Scale: Poor Standing balance comment: Initially requiring minA+1 to stabilize. Standing stability improves with extended time and eventually only requires CGA to remain upright                             Pertinent Vitals/Pain Pain Assessment: 0-10 Pain Score: 8  Pain Location: bilateral foot pain Pain Descriptors / Indicators: Operative site guarding Pain Intervention(s): Monitored during session    Home Living Family/patient expects to be discharged to:: Private residence Living Arrangements: Alone Available Help at Discharge: Friend(s) Type of Home: Other(Comment) (Duplex) Home Access: Stairs to enter Entrance Stairs-Rails: None Entrance Stairs-Number of Steps: 5 Home Layout: One level Home Equipment: None      Prior Function Level of Independence: Independent         Comments: Pt reports independence with ADLs/IADLs. Drives. No falls in the last 12 months     Hand Dominance   Dominant Hand: Right    Extremity/Trunk Assessment   Upper Extremity Assessment Upper Extremity Assessment: Overall WFL for tasks assessed (No focal deficits identified)    Lower Extremity Assessment Lower Extremity Assessment: Overall WFL for tasks assessed (No focal deficits identified)       Communication   Communication: No difficulties  Cognition Arousal/Alertness: Lethargic Behavior During Therapy: WFL for tasks assessed/performed Overall Cognitive Status: Within Functional Limits for tasks assessed                                 General Comments: AOx4 at time of PT evaluation      General Comments      Exercises     Assessment/Plan    PT Assessment Patient needs continued PT services  PT Problem List Decreased strength;Decreased activity  tolerance;Decreased balance;Decreased mobility;Decreased safety awareness       PT Treatment Interventions DME instruction;Gait training;Functional mobility training;Therapeutic activities;Therapeutic exercise;Neuromuscular re-education;Balance training;Stair training;Patient/family education    PT Goals (Current goals can be found in the Care Plan section)  Acute Rehab PT Goals Patient Stated Goal: Return to prior function at home PT Goal Formulation: With patient Time For Goal Achievement: 10/09/16 Potential to Achieve Goals: Good    Frequency 7X/week   Barriers to discharge Decreased caregiver support Lives alone    Co-evaluation               AM-PAC PT "6 Clicks" Daily Activity  Outcome Measure Difficulty turning over in bed (including adjusting bedclothes, sheets and blankets)?: Total Difficulty moving from lying on back to sitting on the side of the bed? : Total Difficulty sitting down on and standing up from a chair with arms (e.g., wheelchair, bedside commode, etc,.)?: Total Help needed moving to and from a bed to chair (including a wheelchair)?: A Lot Help needed walking in hospital room?: A Lot Help needed climbing 3-5 steps with a railing? : Total 6 Click Score: 8    End of Session Equipment Utilized During Treatment: Gait belt;Oxygen Activity Tolerance: Patient tolerated treatment well Patient left: in bed;with  call bell/phone within reach;with bed alarm set Nurse Communication: Mobility status;Other (comment) (R mouth droop) PT Visit Diagnosis: Unsteadiness on feet (R26.81);Muscle weakness (generalized) (M62.81)    Time: 7897-8478 PT Time Calculation (min) (ACUTE ONLY): 28 min   Charges:   PT Evaluation $PT Eval Moderate Complexity: 1 Procedure     PT G Codes:   PT G-Codes **NOT FOR INPATIENT CLASS** Functional Assessment Tool Used: AM-PAC 6 Clicks Basic Mobility Functional Limitation: Mobility: Walking and moving around Mobility: Walking and  Moving Around Current Status (S1282): At least 80 percent but less than 100 percent impaired, limited or restricted Mobility: Walking and Moving Around Goal Status [phone]): At least 20 percent but less than 40 percent impaired, limited or restricted    Phillips Grout PT, DPT    Jhana Giarratano 09/25/2016, 5:18 PM

## 2016-09-25 NOTE — Progress Notes (Signed)
Daily Progress Note   Subjective  - 2 Days Post-Op  F/u b/l foot infections.  C/o some pain to feet  Objective Vitals:   09/24/16 1826 09/24/16 2320 09/25/16 0900 09/25/16 1639  BP:  (!) 157/88 (!) 147/84 (!) 141/75  Pulse:  (!) 106 100 90  Resp:  16 18 18   Temp: 98.4 F (36.9 C) (!) 100.6 F (38.1 C) 99.9 F (37.7 C) (!) 101 F (38.3 C)  TempSrc: Oral Oral Oral Oral  SpO2:  95% 95% 94%  Weight:      Height:        Physical Exam: Right foot wound looks good.  Packing removed.  No purulence.  Left foot with worsening necrosis to dorsal foot at site of gas producing infection.  Some mottling of superficial skin surrounding wound.  Laboratory CBC    Component Value Date/Time   WBC 19.7 (H) 09/25/2016 0405   HGB 9.2 (L) 09/25/2016 0405   HCT 27.5 (L) 09/25/2016 0405   PLT 198 09/25/2016 0405    BMET    Component Value Date/Time   NA 132 (L) 09/25/2016 0405   K 3.5 09/25/2016 0405   CL 98 (L) 09/25/2016 0405   CO2 27 09/25/2016 0405   GLUCOSE 202 (H) 09/25/2016 0405   BUN 20 09/25/2016 0405   CREATININE 1.06 09/25/2016 0405   CALCIUM 8.0 (L) 09/25/2016 0405   GFRNONAA >60 09/25/2016 0405   GFRAA >60 09/25/2016 0405    Assessment/Planning: Osteomyelitis with abscess and gas   Worsening necrosis to dorsal left foot.  Not able to palpate dp pulse today.  Will consult vascular surgery.  Right foot improving.  ID following.  Cultures still pending.  Will likely need further debridment of left foot.  Will f/u Thursday.  Samara Deist A  09/25/2016, 5:59 PM

## 2016-09-25 NOTE — Care Management Note (Addendum)
Case Management Note  Patient Details  Name: Kyle Banks MRN: [id number] Date of Birth: [birth year]  Subjective/Objective:   Attempted to see patient and he was resting quietly with his eyes closed. Chart reviewed. ID pending cultures but patient will most likely need home IV abx for several weeks. He is a newly dx diabetic. Lives alone. PT pending.                  Action/Plan: Following progression  Expected Discharge Date:                  Expected Discharge Plan:  [place]  In-House Referral:     Discharge planning Services  CM Consult  Post Acute Care Choice:  Home Health Choice offered to:     DME Arranged:    DME Agency:     HH Arranged:    [place] Agency:     Status of Service:  In process, will continue to follow  If discussed at Long Length of Stay Meetings, dates discussed:    Additional Comments:  Jolly Mango, RN 09/25/2016, 3:21 PM

## 2016-09-25 NOTE — Anesthesia Postprocedure Evaluation (Signed)
Anesthesia Post Note  Patient: Kyle Banks  Procedure(s) Performed: Procedure(s) (LRB): INCISION AND DRAINAGE (Left) AMPUTATION TOE (Bilateral)  Patient location during evaluation: PACU Anesthesia Type: General Level of consciousness: awake and alert Pain management: pain level controlled Vital Signs Assessment: post-procedure vital signs reviewed and stable Respiratory status: spontaneous breathing, nonlabored ventilation, respiratory function stable and patient connected to nasal cannula oxygen Cardiovascular status: blood pressure returned to baseline and stable Postop Assessment: no signs of nausea or vomiting Anesthetic complications: no     Last Vitals:  Vitals:   09/24/16 1826 09/24/16 2320  BP:  (!) 157/88  Pulse:  (!) 106  Resp:  16  Temp: 36.9 C (!) 38.1 C    Last Pain:  Vitals:   09/25/16 0437  TempSrc:   PainSc: Asleep                 Martha Clan

## 2016-09-25 NOTE — Progress Notes (Signed)
[facility] INFECTIOUS DISEASE PROGRESS NOTE Date of Admission:  09/23/2016     ID: Kyle Banks is a 61 y.o. male with  Active Problems:   Sepsis due to cellulitis ([place])  Subjective: Fever to 100.6, wbc down to 19. Feels better, more alert  ROS  Eleven systems are reviewed and negative except per hpi  Medications:  Antibiotics Given (last 72 hours)    Date/Time Action Medication Dose Rate   09/23/16 2133 New Bag/Given   piperacillin-tazobactam (ZOSYN) IVPB 3.375 g 3.375 g 100 mL/hr   09/23/16 2147 New Bag/Given   vancomycin (VANCOCIN) IVPB 1000 mg/200 mL premix 1,000 mg 200 mL/hr   09/24/16 0455 New Bag/Given   piperacillin-tazobactam (ZOSYN) IVPB 3.375 g 3.375 g 12.5 mL/hr   09/24/16 0456 New Bag/Given   vancomycin (VANCOCIN) IVPB 1000 mg/200 mL premix 1,000 mg 200 mL/hr   09/24/16 1237 New Bag/Given   piperacillin-tazobactam (ZOSYN) IVPB 3.375 g 3.375 g 12.5 mL/hr   09/24/16 1535 New Bag/Given   vancomycin (VANCOCIN) IVPB 1000 mg/200 mL premix 1,000 mg 200 mL/hr   09/24/16 1702 New Bag/Given   cefTAZidime (FORTAZ) 2 g in dextrose 5 % 50 mL IVPB 2 g 100 mL/hr   09/24/16 1703 Given   metroNIDAZOLE (FLAGYL) tablet 500 mg 500 mg    09/24/16 2111 Given   metroNIDAZOLE (FLAGYL) tablet 500 mg 500 mg    09/24/16 2159 New Bag/Given   cefTAZidime (FORTAZ) 2 g in dextrose 5 % 50 mL IVPB 2 g 100 mL/hr   09/25/16 0407 New Bag/Given   vancomycin (VANCOCIN) IVPB 1000 mg/200 mL premix 1,000 mg 200 mL/hr   09/25/16 0553 Given   metroNIDAZOLE (FLAGYL) tablet 500 mg 500 mg    09/25/16 0553 New Bag/Given   cefTAZidime (FORTAZ) 2 g in dextrose 5 % 50 mL IVPB 2 g 100 mL/hr     . chlorhexidine  60 mL Topical Once  . enoxaparin (LOVENOX) injection  40 mg Subcutaneous Q24H  . fenofibrate  160 mg Oral Daily  . insulin aspart  0-20 Units Subcutaneous Q4H  . insulin aspart  5 Units Subcutaneous TID WC  . [START ON 09/26/2016] insulin glargine  25 Units Subcutaneous Q24H  . insulin starter  kit- pen needles  1 kit Other Once  . metroNIDAZOLE  500 mg Oral Q8H  . sodium chloride flush  3 mL Intravenous Q12H    Objective: Vital signs in last 24 hours: Temp:  [98.4 F (36.9 C)-100.6 F (38.1 C)] 99.9 F (37.7 C) (06/12 0900) Pulse Rate:  [92-106] 100 (06/12 0900) Resp:  [16-18] 18 (06/12 0900) BP: (119-157)/(66-88) 147/84 (06/12 0900) SpO2:  [93 %-95 %] 95 % (06/12 0900)  Constitutional: He is obese, awake, interactive HENT: anicteric Mouth/Throat: Oropharynx is clear and dry. No oropharyngeal exudate.  Cardiovascular: Tachycardia, regular Pulmonary/Chest: Effort normal and breath sounds normal.  Abdominal: Soft. Obese Bowel sounds are normal. He exhibits no distension. There is no tenderness.  Lymphadenopathy: He has no cervical adenopathy.  Neurological: He is alert and oriented to person, place, and time.  Skin: bil leg wrapped Psychiatric: interactive  Lab Results  Recent Labs  09/24/16 0307 09/25/16 0405  WBC 26.3* 19.7*  HGB 10.2* 9.2*  HCT 30.2* 27.5*  NA 128* 132*  K 4.2 3.5  CL 92* 98*  CO2 23 27  BUN 32* 20  CREATININE 1.47* 1.06   Lab Results  Component Value Date   ESRSEDRATE 107 (H) 09/25/2016   Lab Results  Component Value Date  CRP 35.6 (H) 09/25/2016    Microbiology: Results for orders placed or performed during the hospital encounter of 09/23/16  Blood culture (routine x 2)     Status: None (Preliminary result)   Collection Time: 09/23/16  7:44 PM  Result Value Ref Range Status   Specimen Description BLOOD LEFT ANTECUBITAL  Final   Special Requests   Final    BOTTLES DRAWN AEROBIC AND ANAEROBIC Blood Culture adequate volume   Culture  Setup Time   Final    GRAM POSITIVE COCCI IN BOTH AEROBIC AND ANAEROBIC BOTTLES CRITICAL RESULT CALLED TO, READ BACK BY AND VERIFIED WITH: JASON ROBBINS AT 1608 ON 09/24/2016 JJB    Culture GRAM POSITIVE COCCI  Final   Report Status PENDING  Incomplete  Blood culture (routine x 2)     Status:  None (Preliminary result)   Collection Time: 09/23/16  7:44 PM  Result Value Ref Range Status   Specimen Description BLOOD RIGHT ANTECUBITAL  Final   Special Requests   Final    BOTTLES DRAWN AEROBIC AND ANAEROBIC Blood Culture adequate volume   Culture  Setup Time   Final    Organism ID to follow GRAM POSITIVE COCCI IN BOTH AEROBIC AND ANAEROBIC BOTTLES CRITICAL RESULT CALLED TO, READ BACK BY AND VERIFIED WITH: JASON ROBBINS AT 1608 ON 09/24/2016 JJB    Culture GRAM POSITIVE COCCI  Final   Report Status PENDING  Incomplete  Blood Culture ID Panel (Reflexed)     Status: Abnormal   Collection Time: 09/23/16  7:44 PM  Result Value Ref Range Status   Enterococcus species NOT DETECTED NOT DETECTED Final   Listeria monocytogenes NOT DETECTED NOT DETECTED Final   Staphylococcus species NOT DETECTED NOT DETECTED Final   Staphylococcus aureus NOT DETECTED NOT DETECTED Final   Streptococcus species DETECTED (A) NOT DETECTED Final    Comment: CRITICAL RESULT CALLED TO, READ BACK BY AND VERIFIED WITH: JASON ROBBINS AT 1608 ON 09/24/2016 JJB    Streptococcus agalactiae DETECTED (A) NOT DETECTED Final    Comment: CRITICAL RESULT CALLED TO, READ BACK BY AND VERIFIED WITH: JASON ROBBINS AT 1608 ON 09/24/2016 JJB    Streptococcus pneumoniae NOT DETECTED NOT DETECTED Final   Streptococcus pyogenes NOT DETECTED NOT DETECTED Final   Acinetobacter baumannii NOT DETECTED NOT DETECTED Final   Enterobacteriaceae species NOT DETECTED NOT DETECTED Final   Enterobacter cloacae complex NOT DETECTED NOT DETECTED Final   Escherichia coli NOT DETECTED NOT DETECTED Final   Klebsiella oxytoca NOT DETECTED NOT DETECTED Final   Klebsiella pneumoniae NOT DETECTED NOT DETECTED Final   Proteus species NOT DETECTED NOT DETECTED Final   Serratia marcescens NOT DETECTED NOT DETECTED Final   Haemophilus influenzae NOT DETECTED NOT DETECTED Final   Neisseria meningitidis NOT DETECTED NOT DETECTED Final   Pseudomonas  aeruginosa NOT DETECTED NOT DETECTED Final   Candida albicans NOT DETECTED NOT DETECTED Final   Candida glabrata NOT DETECTED NOT DETECTED Final   Candida krusei NOT DETECTED NOT DETECTED Final   Candida parapsilosis NOT DETECTED NOT DETECTED Final   Candida tropicalis NOT DETECTED NOT DETECTED Final  Aerobic/Anaerobic Culture (surgical/deep wound)     Status: None (Preliminary result)   Collection Time: 09/24/16 12:34 AM  Result Value Ref Range Status   Specimen Description WOUND RIGHT GREAT TOE  Final   Special Requests NONE  Final   Gram Stain   Final    RARE WBC PRESENT, PREDOMINANTLY PMN RARE SQUAMOUS EPITHELIAL CELLS PRESENT ABUNDANT GRAM POSITIVE COCCI  IN PAIRS MODERATE GRAM NEGATIVE RODS RARE GRAM POSITIVE RODS    Culture   Final    CULTURE REINCUBATED FOR BETTER GROWTH Performed at [facility], [place] [address], [place], [place] [address]    Report Status PENDING  Incomplete  Aerobic/Anaerobic Culture (surgical/deep wound)     Status: None (Preliminary result)   Collection Time: 09/24/16 12:35 AM  Result Value Ref Range Status   Specimen Description WOUND LEFT GREAT TOE  Final   Special Requests NONE  Final   Gram Stain   Final    FEW WBC PRESENT, PREDOMINANTLY PMN ABUNDANT GRAM NEGATIVE RODS IN PAIRS ABUNDANT GRAM NEGATIVE RODS FEW GRAM POSITIVE RODS    Culture   Final    CULTURE REINCUBATED FOR BETTER GROWTH Performed at [facility], [place] [address], [place], [place] [address]    Report Status PENDING  Incomplete    Studies/Results: Ct Head Wo Contrast  Result Date: 09/24/2016 CLINICAL DATA:  Altered mental status. EXAM: CT HEAD WITHOUT CONTRAST TECHNIQUE: Contiguous axial images were obtained from the base of the skull through the vertex without intravenous contrast. COMPARISON:  None. FINDINGS: Brain: No evidence of acute infarction, hemorrhage, hydrocephalus, extra-axial collection or mass lesion/mass effect. Vascular: No hyperdense vessel or  unexpected calcification. Skull: Normal. Negative for fracture or focal lesion. Sinuses/Orbits: No acute finding. Other: None. IMPRESSION: Normal head CT. Electronically Signed   By: Marijo Conception, M.D.   On: 09/24/2016 13:22   Dg Foot 2 Views Left  Result Date: 09/23/2016 CLINICAL DATA:  Drainage and swelling of the great toe EXAM: LEFT FOOT - 2 VIEW COMPARISON:  None. FINDINGS: Osteopenia is present. No fracture or subluxation is seen. Soft tissue gas tracking along the dorsum of the foot and along the medial and lateral aspect of the first digit around the MTP joint. Possible small marginal erosion first DIP joint. Plantar soft tissue calcification and vascular calcification. IMPRESSION: 1. Soft tissue emphysema over the dorsum of the foot, and surrounding the first digit at the level of the distal metatarsal and proximal phalanx, concerning for necrotizing infection. 2. Lucent appearing first distal phalanx, cannot exclude osteomyelitis. Question of a small marginal erosion at the first PIP joint, joint space infection is a concern given the surrounding soft tissue findings. Electronically Signed   By: Donavan Foil M.D.   On: 09/23/2016 21:03   Dg Foot 2 Views Right  Result Date: 09/23/2016 CLINICAL DATA:  Swelling and drainage from the great toes on both feet EXAM: RIGHT FOOT - 2 VIEW COMPARISON:  None. FINDINGS: No fracture or malalignment. Soft tissue thickening and gas at the distal first digit and to a lesser extent the second digit. Irregular lucency and probable erosive change involving the mid and distal portion of the first distal phalanx. Plantar soft tissue calcification. IMPRESSION: 1. Soft tissue enlargement with small amount of soft tissue gas at the distal first digit. 2. Lucency and probable erosive change involving the distal first phalanx suspicious for osteomyelitis. Electronically Signed   By: Donavan Foil M.D.   On: 09/23/2016 20:59    Assessment/Plan: Kyle Banks is a 61  y.o. male with bil great toe infections with gas noted in tissue, and wbc 25 and fever 101 on admit. Sugars were >400, cr elevated (unclear baseline).  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  Per op note on the L the infection extended back to dorsal midfoot and extensive debridement was done.  Cultures are pending  but given the extent of infection I think he will need IV abx for several weeks. BXC + GBStrep. Wound cx pending. ESR 107, crp 35, A1c 15.3  Recommendations Continue vanco pending culture but if no MRSA found can dc Continue  IV ceftazidime and flagyl for now. Thank you very much for the consult. Will follow with you.  Kyle Banks   09/25/2016, 2:30 PM

## 2016-09-25 NOTE — Progress Notes (Signed)
Pharmacy Antibiotic Note  Kyle Banks is a 61 y.o. male admitted on 09/23/2016 with sepsis.  Pharmacy was consulted for Vancomycin and Zosyn. ID following. Zosyn has been discontinued. Pharmacy now consulted for Ceftazidime.   Plan: 1. Continue Ceftazidime 2gm IV every 8hours.   2. 6/12 1530 Vanc tough subtherapeutic at 10                 Ke: 0.078   T1/2: 0.89   Vd:69   Will increase to Vancomycin 1500mg  IV Q12H. Calcualted trough at C[facility] is 15. Trough level prior to 4th dose.  Pharmacy will continue to follow and adjust as needed to maintain trough 15 to 20 mcg/mL for bacteremia and cellulitis .    Height: 5\' 11"  (180.3 cm) Weight: 218 lb 9.6 oz (99.2 kg) IBW/kg (Calculated) : 75.3  Temp (24hrs), Avg:99.4 F (37.4 C), Min:98.4 F (36.9 C), Max:100.6 F (38.1 C)   Recent Labs Lab 09/23/16 1944 09/23/16 1947 09/24/16 0300 09/24/16 0307 09/25/16 0405 09/25/16 1523  WBC  --  25.4*  --  26.3* 19.7*  --   CREATININE  --  1.38*  --  1.47* 1.06  --   LATICACIDVEN 2.8*  --  1.8  --   --   --   VANCOTROUGH  --   --   --   --   --  10*    Estimated Creatinine Clearance: 87.9 mL/min (by C-G formula based on SCr of 1.06 mg/dL).    No Known Allergies  Thank you for allowing pharmacy to be a part of this patient's care.  Ciin Brazzel M Maida Widger, Pharm.D., BCPS Clinical Pharmacist 09/25/2016 3:53 PM

## 2016-09-25 NOTE — Progress Notes (Signed)
[facility] [place] NAME: Zaki Gertsch    MR#:  [id number]  DATE OF BIRTH:  [birth year]  Patient became lethargic yesterday on noted to have myoclonic jerk bilateral upper extremity, patient also had dizziness that time. We did immediately CAT scan of the head, ABG, . Patient had CAT scan did not show any stroke, ABG did not show CO2 retention. Patient had still had high fever up to 101. Antibiotics or changes with by ID physician. Today morning he said he feels weak but denies any other complaints.   CHIEF COMPLAINT:   Chief Complaint  Patient presents with  . Dizziness  . Wound Infection    REVIEW OF SYSTEMS:   ROS CONSTITUTIONAL: No fever, Does have fatigue.  EYES: No blurred or double vision.  EARS, NOSE, AND THROAT: No tinnitus or ear pain.  RESPIRATORY: No cough, shortness of breath, wheezing or hemoptysis.  CARDIOVASCULAR: No chest pain, orthopnea, edema.  GASTROINTESTINAL: No nausea, vomiting, diarrhea or abdominal pain.  GENITOURINARY: No dysuria, hematuria.  ENDOCRINE: No polyuria, nocturia,  HEMATOLOGY: No anemia, easy bruising or bleeding SKIN: No rash or lesion. MUSCULOSKELETAL:Status post amputation  Of both great toes. Dressing present to both feet. NEUROLOGIC: No tingling, numbness, weakness.  PSYCHIATRY: No anxiety or depression.   DRUG ALLERGIES:  No Known Allergies  VITALS:  Blood pressure (!) 157/88, pulse (!) 106, temperature (!) 100.6 F (38.1 C), temperature source Oral, resp. rate 16, height 5\' 11"  (1.803 m), weight 99.2 kg (218 lb 9.6 oz), SpO2 95 %.  PHYSICAL EXAMINATION:  GENERAL:  61 y.o.-year-old patient lying in the bed with no acute distress.  EYES: Pupils equal, round, reactive to light and  accommodation. No scleral icterus. Extraocular muscles intact.  HEENT: Head atraumatic, normocephalic. Oropharynx and nasopharynx clear.  NECK:  Supple, no jugular venous distention. No thyroid  enlargement, no tenderness.  LUNGS: Normal breath sounds bilaterally, no wheezing, rales,rhonchi or crepitation. No use of accessory muscles of respiration.  CARDIOVASCULAR: S1, S2 normal. No murmurs, rubs, or gallops.  ABDOMEN: Soft, nontender, nondistended. Bowel sounds present. No organomegaly or mass.  EXTREMITIES;Dressing present to both feet. NEUROLOGIC: Cranial nerves II through XII are intact. Muscle strength 5/5 in all extremities. Sensation intact. Gait not checked.  PSYCHIATRIC: The patient is alert and oriented x 3.  SKIN: Dressing present to both feet.  LABORATORY PANEL:   CBC  Recent Labs Lab 09/25/16 0405  WBC 19.7*  HGB 9.2*  HCT 27.5*  PLT 198   ------------------------------------------------------------------------------------------------------------------  Chemistries   Recent Labs Lab 09/24/16 0300 09/24/16 0307 09/25/16 0405  NA  --  128* 132*  K  --  4.2 3.5  CL  --  92* 98*  CO2  --  23 27  GLUCOSE  --  460* 202*  BUN  --  32* 20  CREATININE  --  1.47* 1.06  CALCIUM  --  8.1* 8.0*  MG 2.0  --   --   AST  --  29  --   ALT  --  40  --   ALKPHOS  --  167*  --   BILITOT  --  1.1  --    ------------------------------------------------------------------------------------------------------------------  Cardiac Enzymes No results for input(s): TROPONINI in the last 168 hours. ------------------------------------------------------------------------------------------------------------------  RADIOLOGY:  Ct Head Wo Contrast  Result Date: 09/24/2016 CLINICAL DATA:  Altered mental status. EXAM: CT HEAD WITHOUT CONTRAST TECHNIQUE: Contiguous axial images were obtained from the base of the skull  through the vertex without intravenous contrast. COMPARISON:  None. FINDINGS: Brain: No evidence of acute infarction, hemorrhage, hydrocephalus, extra-axial collection or mass lesion/mass effect. Vascular: No hyperdense vessel or unexpected calcification. Skull:  Normal. Negative for fracture or focal lesion. Sinuses/Orbits: No acute finding. Other: None. IMPRESSION: Normal head CT. Electronically Signed   By: Marijo Conception, M.D.   On: 09/24/2016 13:22   Dg Foot 2 Views Left  Result Date: 09/23/2016 CLINICAL DATA:  Drainage and swelling of the great toe EXAM: LEFT FOOT - 2 VIEW COMPARISON:  None. FINDINGS: Osteopenia is present. No fracture or subluxation is seen. Soft tissue gas tracking along the dorsum of the foot and along the medial and lateral aspect of the first digit around the MTP joint. Possible small marginal erosion first DIP joint. Plantar soft tissue calcification and vascular calcification. IMPRESSION: 1. Soft tissue emphysema over the dorsum of the foot, and surrounding the first digit at the level of the distal metatarsal and proximal phalanx, concerning for necrotizing infection. 2. Lucent appearing first distal phalanx, cannot exclude osteomyelitis. Question of a small marginal erosion at the first PIP joint, joint space infection is a concern given the surrounding soft tissue findings. Electronically Signed   By: Donavan Foil M.D.   On: 09/23/2016 21:03   Dg Foot 2 Views Right  Result Date: 09/23/2016 CLINICAL DATA:  Swelling and drainage from the great toes on both feet EXAM: RIGHT FOOT - 2 VIEW COMPARISON:  None. FINDINGS: No fracture or malalignment. Soft tissue thickening and gas at the distal first digit and to a lesser extent the second digit. Irregular lucency and probable erosive change involving the mid and distal portion of the first distal phalanx. Plantar soft tissue calcification. IMPRESSION: 1. Soft tissue enlargement with small amount of soft tissue gas at the distal first digit. 2. Lucency and probable erosive change involving the distal first phalanx suspicious for osteomyelitis. Electronically Signed   By: Donavan Foil M.D.   On: 09/23/2016 20:59    EKG:   Orders placed or performed during the hospital encounter of  09/23/16  . ED EKG  . ED EKG  . 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 WBC is better today to 19.7.  #2. Diabetes mellitus type 2 uncontrolled: Patient has no PCP. Blood sugarswere ranging in 400s. Seen by diabetes coordinator, started on Lantus, NovoLog 3 units 3 times a day, add hemoglobin A1c, living well with diabetes book is given, I explained the patient that patient needs PCP and also education about diabetes, we will give him teaching about food choices. adjustthe Lantus today, blood sugars are better today  3. hyponatremia and acute kidney injury: Improving.  #4/ hypoxemia requiring 4 L of oxygen this morning. Check chest x-ray today again to evaluate for pneumonia. Afebrile this morning.   #5 metabolic encephalopathy secondary to sepsis. Improving, alert, awake, oriented, no focal neurological deficit today. #6 hypertriglyceridemia, unable to calculate LDL because high triglycerides. Add TriCor .  All the records are reviewed and case discussed with Care Management/Social Workerr. Management plans discussed with the patient, family and they are in agreement.  CODE STATUS: full  TOTAL TIME TAKING CARE OF THIS PATIENT: 91minutes.   POSSIBLE D/C IN 1-2 DAYS, DEPENDING ON CLINICAL CONDITION.   Epifanio Lesches M.D on 09/25/2016 at 7:38 AM  Between 7am to 6pm - Pager - [phone]  After 6pm go to [email or web address] - password [email or web address] [facility]  Office  [phone]  CC: Primary care physician; Patient, No Pcp Per   Note: This dictation was prepared with Dragon dictation along with smaller phrase technology. Any transcriptional errors that result from this process are unintentional.

## 2016-09-25 NOTE — Progress Notes (Signed)
Inpatient Diabetes Program Recommendations  AACE/ADA: New Consensus Statement on Inpatient Glycemic Control (2015)  Target Ranges:  Prepandial:   less than 140 mg/dL      [facility] postprandial:   less than 180 mg/dL (1-2 hours)      Critically ill patients:  140 - 180 mg/dL   Lab Results  Component Value Date   GLUCAP 200 (H) 09/25/2016   HGBA1C 15.3 (H) 09/24/2016    Review of Glycemic Control  Results for Kyle, Banks (MRN [id number]) as of 09/25/2016 07:15  Ref. Range 09/24/2016 11:33 09/24/2016 16:51 09/24/2016 21:04 09/24/2016 23:41 09/25/2016 04:22  Glucose-Capillary Latest Ref Range: 65 - 99 mg/dL 280 (H) 266 (H) 264 (H) 235 (H) 200 (H)   Diabetes history: NO Outpatient Diabetes medications: None Current orders for Inpatient glycemic control: Novolog 0-20 units Q4H, Lantus 15 units qam, Novolog 3 units tid  Inpatient Diabetes Program Recommendations: Please consider changing Novolog 0-20units q4h to Novolog 0-20 units tid and add Novolog 0-5 units qhs  Spoke with patient regarding recent diagnosis. Patient was very surprised by the A1C of 15.3% . Although this reflects a 3 month average blood sugars, patient tells me he has only been feeling bad for about 3 days.  Discussed the importance of lower blood sugars in order to decrease potential complications; poor healing, heart disease, further amputation, stroke, kidney failure.   Patient tells me he lives alone and his children live in [name] and [place].   Since this patient will need to go home on insulin, I have asked the nurse to begin teaching and have patient give insulin at every opportunity.   Living well with diabetes book at the bedside- patient has not felt well enough to begin reading it.  Gentry Fitz, RN, BA, MHA, CDE Diabetes Coordinator Inpatient Diabetes Program  [phone] (Team Pager) [phone] ([place]) 09/25/2016 12:55 PM

## 2016-09-26 ENCOUNTER — Encounter
Admission: EM | Disposition: A | Discharge status: Skilled Nursing Facility | Payer: Self-pay | Source: Home / Self Care | Attending: Internal Medicine

## 2016-09-26 DIAGNOSIS — I70262 Atherosclerosis of native arteries of extremities with gangrene, left leg: Secondary | ICD-10-CM

## 2016-09-26 HISTORY — PX: PERIPHERAL VASCULAR INTERVENTION: CATH118257

## 2016-09-26 LAB — CBC
HEMATOCRIT: 26.6 % — AB (ref 40.0–52.0)
HEMOGLOBIN: 9.1 g/dL — AB (ref 13.0–18.0)
MCH: 28.6 pg (ref 26.0–34.0)
MCHC: 34.2 g/dL (ref 32.0–36.0)
MCV: 83.7 fL (ref 80.0–100.0)
Platelets: 223 10*3/uL (ref 150–440)
RBC: 3.18 MIL/uL — ABNORMAL LOW (ref 4.40–5.90)
RDW: 14.3 % (ref 11.5–14.5)
WBC: 18.4 10*3/uL — ABNORMAL HIGH (ref 3.8–10.6)

## 2016-09-26 LAB — GLUCOSE, CAPILLARY
GLUCOSE-CAPILLARY: 158 mg/dL — AB (ref 65–99)
GLUCOSE-CAPILLARY: 151 mg/dL — AB (ref 65–99)
Glucose-Capillary: 169 mg/dL — ABNORMAL HIGH (ref 65–99)
Glucose-Capillary: 219 mg/dL — ABNORMAL HIGH (ref 65–99)
Glucose-Capillary: 131 mg/dL — ABNORMAL HIGH (ref 65–99)

## 2016-09-26 LAB — BASIC METABOLIC PANEL
Anion gap: 7 (ref 5–15)
BUN: 16 mg/dL (ref 6–20)
CALCIUM: 8 mg/dL — AB (ref 8.9–10.3)
CHLORIDE: 101 mmol/L (ref 101–111)
CO2: 26 mmol/L (ref 22–32)
CREATININE: 0.97 mg/dL (ref 0.61–1.24)
GFR calc non Af Amer: >60 mL/min (ref >60)
GLUCOSE: 151 mg/dL — AB (ref 65–99)
Potassium: 3.6 mmol/L (ref 3.5–5.1)
Sodium: 134 mmol/L — ABNORMAL LOW (ref 135–145)

## 2016-09-26 LAB — HEMOGLOBIN A1C
Hgb A1c MFr Bld: 15.1 % — ABNORMAL HIGH (ref 4.8–5.6)
MEAN PLASMA GLUCOSE: 387 mg/dL

## 2016-09-26 LAB — SURGICAL PATHOLOGY

## 2016-09-26 SURGERY — PERIPHERAL VASCULAR INTERVENTION
Anesthesia: Moderate Sedation | Laterality: Left

## 2016-09-26 MED ORDER — INSULIN GLARGINE 100 UNIT/ML ~~LOC~~ SOLN
30.0000 [IU] | SUBCUTANEOUS | Status: DC
  Administered 2016-09-27 – 2016-09-30 (×3): 30 [IU] via SUBCUTANEOUS
  Filled 2016-09-26 (×5): qty 0.3

## 2016-09-26 MED ORDER — HEPARIN SODIUM (PORCINE) 1000 UNIT/ML IJ SOLN
INTRAMUSCULAR | Status: DC | PRN
  Administered 2016-09-26: 5000 [IU] via INTRAVENOUS

## 2016-09-26 MED ORDER — MIDAZOLAM HCL 2 MG/2ML IJ SOLN
INTRAMUSCULAR | Status: DC | PRN
  Administered 2016-09-26: 2 mg via INTRAVENOUS

## 2016-09-26 MED ORDER — IOPAMIDOL (ISOVUE-300) INJECTION 61%
INTRAVENOUS | Status: DC | PRN
  Administered 2016-09-26: 110 mL via INTRA_ARTERIAL

## 2016-09-26 MED ORDER — INSULIN GLARGINE 100 UNIT/ML ~~LOC~~ SOLN
5.0000 [IU] | Freq: Once | SUBCUTANEOUS | Status: DC
  Filled 2016-09-26: qty 0.05

## 2016-09-26 MED ORDER — FENTANYL CITRATE (PF) 100 MCG/2ML IJ SOLN
INTRAMUSCULAR | Status: AC
  Filled 2016-09-26: qty 2

## 2016-09-26 MED ORDER — LIDOCAINE HCL (PF) 1 % IJ SOLN
INTRAMUSCULAR | Status: AC
  Filled 2016-09-26: qty 30

## 2016-09-26 MED ORDER — MIDAZOLAM HCL 5 MG/5ML IJ SOLN
INTRAMUSCULAR | Status: AC
  Filled 2016-09-26: qty 5

## 2016-09-26 MED ORDER — FENTANYL CITRATE (PF) 100 MCG/2ML IJ SOLN
INTRAMUSCULAR | Status: DC | PRN
  Administered 2016-09-26: 50 ug via INTRAVENOUS

## 2016-09-26 MED ORDER — CEFTAZIDIME AND DEXTROSE 2 GM/50ML IV SOLR
2.0000 g | Freq: Three times a day (TID) | INTRAVENOUS | Status: DC
  Administered 2016-09-26 – 2016-09-27 (×3): 2 g via INTRAVENOUS
  Filled 2016-09-26 (×6): qty 50

## 2016-09-26 MED ORDER — ASPIRIN EC 81 MG PO TBEC
81.0000 mg | DELAYED_RELEASE_TABLET | Freq: Every day | ORAL | Status: DC
Start: 2016-09-26 — End: 2016-10-10
  Administered 2016-09-26 – 2016-10-10 (×14): 81 mg via ORAL
  Filled 2016-09-26 (×14): qty 1

## 2016-09-26 MED ORDER — HEPARIN (PORCINE) IN NACL 2-0.9 UNIT/ML-% IJ SOLN
INTRAMUSCULAR | Status: AC
  Filled 2016-09-26: qty 1000

## 2016-09-26 MED ORDER — HEPARIN SODIUM (PORCINE) 1000 UNIT/ML IJ SOLN
INTRAMUSCULAR | Status: AC
  Filled 2016-09-26: qty 1

## 2016-09-26 SURGICAL SUPPLY — 18 items
BALLN LUTONIX DCB 4X40X130 (BALLOONS) ×3
BALLOON LUTONIX DCB 4X40X130 (BALLOONS) ×1 IMPLANT
CATH PIG 70CM (CATHETERS) ×3 IMPLANT
DEVICE PRESTO INFLATION (MISCELLANEOUS) ×3 IMPLANT
DEVICE SAFEGUARD 24CM (GAUZE/BANDAGES/DRESSINGS) ×3 IMPLANT
DEVICE STARCLOSE SE CLOSURE (Vascular Products) ×3 IMPLANT
DEVICE TORQUE (MISCELLANEOUS) ×3 IMPLANT
GLIDECATH ANGLED 4FR 120CM (CATHETERS) ×3 IMPLANT
GLIDEWIRE ANGLED SS 035X260CM (WIRE) ×3 IMPLANT
NEEDLE ENTRY 21GA 7CM ECHOTIP (NEEDLE) ×3 IMPLANT
PACK ANGIOGRAPHY (CUSTOM PROCEDURE TRAY) ×3 IMPLANT
SET INTRO CAPELLA COAXIAL (SET/KITS/TRAYS/PACK) ×3 IMPLANT
SHEATH BRITE TIP 5FRX11 (SHEATH) ×6 IMPLANT
SHEATH RAABE 6FR (SHEATH) ×3 IMPLANT
SYR MEDRAD MARK V 150ML (SYRINGE) ×3 IMPLANT
TUBING CONTRAST HIGH PRESS 72 (TUBING) ×3 IMPLANT
WIRE J 3MM .035X145CM (WIRE) ×3 IMPLANT
WIRE MAGIC TORQUE 260C (WIRE) ×3 IMPLANT

## 2016-09-26 NOTE — Progress Notes (Signed)
[facility] at [place] NAME: Kyle Banks    MR#:  [id number]  DATE OF BIRTH:  March 02, 1956  Seen today, denies any complaints. Noted to have no pulse on left foot, podiatry recommended vascular consult.  No Fever this morning.   CHIEF COMPLAINT:   Chief Complaint  Patient presents with  . Dizziness  . Wound Infection    REVIEW OF SYSTEMS:   ROS CONSTITUTIONAL: No fever, Does have fatigue.  EYES: No blurred or double vision.  EARS, NOSE, AND THROAT: No tinnitus or ear pain.  RESPIRATORY: No cough, shortness of breath, wheezing or hemoptysis.  CARDIOVASCULAR: No chest pain, orthopnea, edema.  GASTROINTESTINAL: No nausea, vomiting, diarrhea or abdominal pain.  GENITOURINARY: No dysuria, hematuria.  ENDOCRINE: No polyuria, nocturia,  HEMATOLOGY: No anemia, easy bruising or bleeding SKIN: No rash or lesion. MUSCULOSKELETAL:Status post amputation  Of both great toes. Dressing present to both feet. NEUROLOGIC: No tingling, numbness, weakness.  PSYCHIATRY: No anxiety or depression.   DRUG ALLERGIES:  No Known Allergies  VITALS:  Blood pressure 132/75, pulse 86, temperature 98.4 F (36.9 C), temperature source Oral, resp. rate 16, height 5\' 11"  (1.803 m), weight 99.2 kg (218 lb 9.6 oz), SpO2 94 %.  PHYSICAL EXAMINATION:  GENERAL:  61 y.o.-year-old patient lying in the bed with no acute distress.  EYES: Pupils equal, round, reactive to light and  accommodation. No scleral icterus. Extraocular muscles intact.  HEENT: Head atraumatic, normocephalic. Oropharynx and nasopharynx clear.  NECK:  Supple, no jugular venous distention. No thyroid enlargement, no tenderness.  LUNGS: Normal breath sounds bilaterally, no wheezing, rales,rhonchi or crepitation. No use of accessory muscles of respiration.  CARDIOVASCULAR: S1, S2 normal. No murmurs, rubs, or gallops.  ABDOMEN: Soft, nontender, nondistended. Bowel sounds present. No organomegaly or  mass.  EXTREMITIES;Dressing present to both feet. NEUROLOGIC: Cranial nerves II through XII are intact. Muscle strength 5/5 in all extremities. Sensation intact. Gait not checked.  PSYCHIATRIC: The patient is alert and oriented x 3.  SKIN: Dressing present to both feet.  LABORATORY PANEL:   CBC  Recent Labs Lab 09/26/16 0350  WBC 18.4*  HGB 9.1*  HCT 26.6*  PLT 223   ------------------------------------------------------------------------------------------------------------------  Chemistries   Recent Labs Lab 09/24/16 0300 09/24/16 0307  09/26/16 0350  NA  --  128*  < > 134*  K  --  4.2  < > 3.6  CL  --  92*  < > 101  CO2  --  23  < > 26  GLUCOSE  --  460*  < > 151*  BUN  --  32*  < > 16  CREATININE  --  1.47*  < > 0.97  CALCIUM  --  8.1*  < > 8.0*  MG 2.0  --   --   --   AST  --  29  --   --   ALT  --  40  --   --   ALKPHOS  --  167*  --   --   BILITOT  --  1.1  --   --   < > = values in this interval not displayed. ------------------------------------------------------------------------------------------------------------------  Cardiac Enzymes No results for input(s): TROPONINI in the last 168 hours. ------------------------------------------------------------------------------------------------------------------  RADIOLOGY:  Ct Head Wo Contrast  Result Date: 09/24/2016 CLINICAL DATA:  Altered mental status. EXAM: CT HEAD WITHOUT CONTRAST TECHNIQUE: Contiguous axial images were obtained from the base of the skull through the vertex without intravenous  contrast. COMPARISON:  None. FINDINGS: Brain: No evidence of acute infarction, hemorrhage, hydrocephalus, extra-axial collection or mass lesion/mass effect. Vascular: No hyperdense vessel or unexpected calcification. Skull: Normal. Negative for fracture or focal lesion. Sinuses/Orbits: No acute finding. Other: None. IMPRESSION: Normal head CT. Electronically Signed   By: Marijo Conception, M.D.   On: 09/24/2016 13:22     EKG:   Orders placed or performed during the hospital encounter of 09/23/16  . ED EKG  . ED EKG  . 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 is on [place], Flagyl since 6/12. WBC is better today to 18.4 Peripheral vascular disease, poor dorsalis pedis pulse on the left foot, vascular consulted, aspirin, statins, vascular consulted. Worsening necrosis of the dorsal left foot: Likely need further debridement of the left foot as per podiatry note.  #2. Diabetes mellitus type 2 uncontrolled: Patient has no PCP. Blood sugarswere ranging in 400s. Seen by diabetes coordinator, started on Lantus, NovoLog 3 units 3 times a day,  Hemoglobin A1c is 15, blood sugar better controlled today. Continue  current  Diabetic regimen.   3. hyponatremia and acute kidney injury: Improving.  #4/,hypoxia due to sepsis: Resolving.   metabolic encephalopathy secondary to sepsis. Improving,   #6 hypertriglyceridemia, unable to calculate LDL because high triglycerides. Add TriCor .  All the records are reviewed and case discussed with Care Management/Social Workerr. Management plans discussed with the patient, family and they are in agreement.  CODE STATUS: full  TOTAL TIME TAKING CARE OF THIS PATIENT: 52minutes.   POSSIBLE D/C IN 1-2 DAYS, DEPENDING ON CLINICAL CONDITION.   Epifanio Lesches M.D on 09/26/2016 at 7:34 AM  Between 7am to 6pm - Pager - [phone]  After 6pm go to [email or web address] - password [email or web address] [facility]  Office  [phone]  CC: Primary care physician; Patient, No Pcp Per   Note: This dictation was prepared with Dragon dictation along with smaller phrase technology. Any transcriptional errors that result from this  process are unintentional.

## 2016-09-26 NOTE — Clinical Social Work Placement (Signed)
   CLINICAL SOCIAL WORK PLACEMENT  NOTE  Date:  09/26/2016  Patient Details  Name: Kyle Banks MRN: [id number] Date of Birth: [birth year]  Clinical Social Work is seeking post-discharge placement for this patient at the [place] level of care (*CSW will initial, date and re-position this form in  chart as items are completed):  Yes   Patient/family provided with [facility] Work Department's list of facilities offering this level of care within the geographic area requested by the patient (or if unable, by the patient's family).  Yes   Patient/family informed of their freedom to choose among providers that offer the needed level of care, that participate in Medicare, Medicaid or managed care program needed by the patient, have an available bed and are willing to accept the patient.  Yes   Patient/family informed of [facility]'s ownership interest in [facility] and [facility], as well as of the fact that they are under no obligation to receive care at these facilities.  PASRR submitted to EDS on 09/26/16     PASRR number received on 09/26/16     Existing PASRR number confirmed on       FL2 transmitted to all facilities in geographic area requested by pt/family on 09/26/16     FL2 transmitted to all facilities within larger geographic area on       Patient informed that his/her managed care company has contracts with or will negotiate with certain facilities, including the following:        Yes   Patient/family informed of bed offers received.  Patient chooses bed at  ([facility] )     Physician recommends and patient chooses bed at      Patient to be transferred to   on  .  Patient to be transferred to facility by       Patient family notified on   of transfer.  Name of family member notified:        PHYSICIAN       Additional Comment:    _______________________________________________ [name], Veronia Beets, LCSW 09/26/2016, 3:21  PM

## 2016-09-26 NOTE — Progress Notes (Signed)
Physical Therapy Treatment Patient Details Name: Kyle Banks MRN: [id number] DOB: Sep 04, 1955 Today's Date: 09/26/2016    History of Present Illness Kyle Banks is a 61 y.o. male with no known medical history presents to the emergency department for evaluation of foot wounds. Patient was in a usual state of health until 3 days ago when he describes the sudden onset of redness, pain, swelling and discharge from his feet bilaterally. He states prior to this infection his feet appeared normal. Patient is unaware of any medical problems such as diabetes and states that he has had annual physicals and blood work at various urgent care centers. Patient denies fevers/chills, weakness, dizziness, chest pain, shortness of breath, N/V/C/D, abdominal pain, dysuria/frequency, changes in mental status. Otherwise, there has been no change in status. Patient has been taking medication as prescribed and there has been no recent change in medication or diet.  No recent antibiotics.  There has been no recent illness, hospitalizations, travel or sick contacts. 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 Attempted to peform PT evaluation on 09/24/16 however pt with myoclonic jerks, dizziness, and facial droop when attempting ambulation. Head CT negative for acute infarct.    PT Comments    Pt agreeable to PT; reports 3/10 pain in B feet. Pt quite lethargic due to pain medications and liquid diet due to upcoming procedure. Pt agreeable to supine bed exercises only at this time. Requires gentle awakening several times between exercise sets. Pt performs well with increased time; requires assist only for left ankle range and cues for technique throughout. Continue PT to progress strength, endurance and out of bed activity to improve functional mobility.    Follow Up Recommendations  SNF     Equipment Recommendations  Rolling walker with 5"  wheels    Recommendations for Other Services       Precautions / Restrictions Precautions Precautions: Fall Restrictions Weight Bearing Restrictions: No    Mobility  Bed Mobility               General bed mobility comments: Not tested; pt refuses up in/out of bed  Transfers                    Ambulation/Gait                 Stairs            Wheelchair Mobility    Modified Rankin (Stroke Patients Only)       Balance                                            Cognition Arousal/Alertness: Lethargic Behavior During Therapy: WFL for tasks assessed/performed Overall Cognitive Status: Within Functional Limits for tasks assessed                                        Exercises General Exercises - Lower Extremity Ankle Circles/Pumps: AAROM;Left;10 reps;Supine (AROM R) Quad Sets: Strengthening;Both;10 reps;Supine Gluteal Sets: Strengthening;Both;10 reps;Supine Short Arc Quad: AROM;Both;10 reps;Supine Heel Slides: AROM;Both;10 reps;Supine Hip ABduction/ADduction: AROM;Both;Supine;Other reps (comment) (12) Straight Leg Raises: Strengthening;Both;10 reps;Supine    General Comments        Pertinent Vitals/Pain Pain Assessment: 0-10 Pain Score:  3  Pain Location: bilateral feet Pain Intervention(s): Monitored during session;Premedicated before session    Home Living                      Prior Function            PT Goals (current goals can now be found in the care plan section)      Frequency    7X/week      PT Plan Current plan remains appropriate    Co-evaluation              AM-PAC PT "6 Clicks" Daily Activity  Outcome Measure  Difficulty turning over in bed (including adjusting bedclothes, sheets and blankets)?: Total Difficulty moving from lying on back to sitting on the side of the bed? : Total Difficulty sitting down on and standing up from a chair with arms  (e.g., wheelchair, bedside commode, etc,.)?: Total Help needed moving to and from a bed to chair (including a wheelchair)?: A Lot Help needed walking in hospital room?: A Lot Help needed climbing 3-5 steps with a railing? : Total 6 Click Score: 8    End of Session Equipment Utilized During Treatment: Oxygen Activity Tolerance: Patient limited by lethargy;Patient limited by fatigue Patient left: in bed;with call bell/phone within reach;with bed alarm set;Other (comment) (LEs elevated)   PT Visit Diagnosis: Unsteadiness on feet (R26.81);Muscle weakness (generalized) (M62.81)     Time: 1036-1100 PT Time Calculation (min) (ACUTE ONLY): 24 min  Charges:  $Therapeutic Exercise: 23-37 mins                    G Codes:  Functional Assessment Tool Used: AM-PAC 6 Clicks Basic Mobility     Larae Grooms, PTA 09/26/2016, 11:02 AM

## 2016-09-26 NOTE — Clinical Social Work Note (Signed)
Clinical Social Work Assessment  Patient Details  Name: Kyle Banks MRN: [id number] Date of Birth: 06-21-1955  Date of referral:  09/26/16               Reason for consult:  Facility Placement                Permission sought to share information with:  Chartered certified accountant granted to share information::  Yes, Verbal Permission Granted  Name::      [name]::   [name]   Relationship::     Contact Information:     Housing/Transportation Living arrangements for the past 2 months:  [name] of Information:  Patient Patient Interpreter Needed:  None Criminal Activity/Legal Involvement Pertinent to Current Situation/Hospitalization:  No - Comment as needed Significant Relationships:  None Lives with:  Self Do you feel safe going back to the place where you live?  Yes Need for family participation in patient care:  Yes (Comment)  Care giving concerns:  Patient lives alone in [name].    Social Worker assessment / plan:  Holiday representative (CSW) reviewed chart and noted that PT is recommending SNF and patient will likely need IV ABX at D/C. CSW met with patient alone at bedside to discuss D/C plan. Patient was alert and oriented X4 and was laying in the bed. CSW introduced self and explained role of CSW department. Patient reported that he lives alone in [name]. Patient reported that his daughter lives in [name] and his son lives in [name]. Patient reported that he doesn't have family in [name]. CSW explained SNF option and that his [employer] will have to approve SNF. Patient is agreeable to SNF search in [name] and prefers [facility]. FL2 complete and faxed out.   CSW presented bed offers to patient and he chose [facility]. [name] is aware of accepted bed offer and started [facility] authorization today. CSW will continue to follow and assist as needed.   Employment status:   Psychologist, counselling:  Managed Care PT Recommendations:  [place] / Referral to community resources:  [place]  Patient/Family's Response to care:  Patient is agreeable to D/C to [facility].   Patient/Family's Understanding of and Emotional Response to Diagnosis, Current Treatment, and Prognosis:  Patient was very pleasant and thanked CSW for assistance.   Emotional Assessment Appearance:  Appears stated age Attitude/Demeanor/Rapport:    Affect (typically observed):  Accepting, Adaptable, Pleasant Orientation:  Oriented to Self, Oriented to Place, Oriented to  Time, Oriented to Situation Alcohol / Substance use:  Not Applicable Psych involvement (Current and /or in the community):  No (Comment)  Discharge Needs  Concerns to be addressed:  Discharge Planning Concerns Readmission within the last 30 days:  No Current discharge risk:  Dependent with Mobility Barriers to Discharge:  Continued Medical Work up   [employer], Veronia Beets, LCSW 09/26/2016, 3:22 PM

## 2016-09-26 NOTE — Progress Notes (Signed)
Patient in recovery post angiogram with intervention per Dr Delana Meyer, right groin without bleeding nor hematoma, pad pressure device to right groin with 103ml/air instilled. No visible bleeding at this time. To remain on right groin overnight per orders. Family members at bedside. Dr. Delana Meyer out to speak with patient and family at length with questions answered regarding procedure. Pt denies complaints at this time. Sinus rhythm per monitor, alert and oriented, taking po's without difficulty. Vitals stable. Report called to care nurse ([phone]) with plan reviewed.

## 2016-09-26 NOTE — NC FL2 (Signed)
[place] LEVEL OF CARE SCREENING TOOL     IDENTIFICATION  Patient Name: Kyle Banks Birthdate: [birth year] Sex: male Admission Date (Current Location): 09/23/2016  [place] and [place] Number:  Engineering geologist and Address:  [facility], [address] [place], [place] [address]      Provider Number: [id number]  Attending Physician Name and Address:  Epifanio Lesches, MD  Relative Name and Phone Number:       Current Level of Care: Hospital Recommended Level of Care: [place] Prior Approval Number:    Date Approved/Denied:   PASRR Number:  [id number])  Discharge Plan: SNF    Current Diagnoses: Patient Active Problem List   Diagnosis Date Noted  . Sepsis due to cellulitis ([place]) 09/23/2016    Orientation RESPIRATION BLADDER Height & Weight     Self, Situation, Place  O2 (2 Liters Oxygen ) Incontinent Weight: 218 lb 9.6 oz (99.2 kg) Height:  _0  (180.3 cm)  BEHAVIORAL SYMPTOMS/MOOD NEUROLOGICAL BOWEL NUTRITION STATUS   (none )  (none) Continent Diet (Diet: Clear Liquid to be advanced )  AMBULATORY STATUS COMMUNICATION OF NEEDS Skin   Extensive Assist Verbally Surgical wounds (Incision: Left Foot )                       Personal Care Assistance Level of Assistance  Bathing, Feeding, Dressing Bathing Assistance: Limited assistance Feeding assistance: Independent Dressing Assistance: Limited assistance     Functional Limitations Info  Sight, Hearing, Speech Sight Info: Adequate Hearing Info: Adequate Speech Info: Adequate    SPECIAL CARE FACTORS FREQUENCY  PT (By licensed PT), OT (By licensed OT)   Will likely need IV ABX      PT Frequency:  (5) OT Frequency:  (5)            Contractures      Additional Factors Info  Code Status, Allergies, Insulin Sliding Scale Code Status Info:  (Full Code. ) Allergies Info:  (No Known Allergies. )   Insulin Sliding Scale Info:   (NovoLog Insulin Injections. )       Current Medications (09/26/2016):  This is the current hospital active medication list Current Facility-Administered Medications  Medication Dose Route Frequency Provider Last Rate Last Dose  . 0.9 %  sodium chloride infusion   Intravenous Continuous Epifanio Lesches, MD 100 mL/hr at 09/25/16 2350    . acetaminophen (TYLENOL) tablet 650 mg  650 mg Oral Q6H PRN Hugelmeyer, Alexis, DO   650 mg at 09/25/16 1647   Or  . acetaminophen (TYLENOL) suppository 650 mg  650 mg Rectal Q6H PRN Hugelmeyer, Alexis, DO   650 mg at 09/24/16 1237  . albuterol (PROVENTIL) (2.5 MG/3ML) 0.083% nebulizer solution 2.5 mg  2.5 mg Nebulization Q6H PRN Hugelmeyer, Alexis, DO      . aspirin EC tablet 81 mg  81 mg Oral Daily Epifanio Lesches, MD      . bisacodyl (DULCOLAX) EC tablet 5 mg  5 mg Oral Daily PRN Hugelmeyer, Alexis, DO      . cefTAZidime (FORTAZ) 2 g in dextrose 5 % 50 mL IVPB  2 g Intravenous Q8H Epifanio Lesches, MD   Stopped at 09/25/16 2245  . ceftAZIDime (FORTAZ) 2 gram/50 mL D5W IVPB - DUPLEX  2 g Intravenous Q8H Konidena, Snehalatha, MD      . chlorhexidine (HIBICLENS) 4 % liquid 4 application  60 mL Topical Once Samara Deist, DPM      .  enoxaparin (LOVENOX) injection 40 mg  40 mg Subcutaneous Q24H Hugelmeyer, Alexis, DO   40 mg at 09/25/16 2215  . fenofibrate tablet 160 mg  160 mg Oral Daily Epifanio Lesches, MD   160 mg at 09/25/16 0849  . insulin aspart (novoLOG) injection 0-20 Units  0-20 Units Subcutaneous Q4H Hugelmeyer, Alexis, DO   4 Units at 09/26/16 0500  . insulin aspart (novoLOG) injection 5 Units  5 Units Subcutaneous TID WC Epifanio Lesches, MD   5 Units at 09/25/16 1648  . insulin glargine (LANTUS) injection 25 Units  25 Units Subcutaneous Q24H Epifanio Lesches, MD      . insulin starter kit- pen needles (English) 1 kit  1 kit Other Once Epifanio Lesches, MD      . ipratropium (ATROVENT) nebulizer solution 0.5 mg  0.5 mg  Nebulization Q6H PRN Hugelmeyer, Alexis, DO      . magnesium citrate solution 1 Bottle  1 Bottle Oral Once PRN Hugelmeyer, Alexis, DO      . metroNIDAZOLE (FLAGYL) tablet 500 mg  500 mg Oral Q8H Leonel Ramsay, MD   500 mg at 09/26/16 8757  . morphine 2 MG/ML injection 2 mg  2 mg Intravenous Q2H PRN Samara Deist, DPM   2 mg at 09/25/16 0407  . ondansetron (ZOFRAN) tablet 4 mg  4 mg Oral Q6H PRN Hugelmeyer, Alexis, DO       Or  . ondansetron (ZOFRAN) injection 4 mg  4 mg Intravenous Q6H PRN Hugelmeyer, Alexis, DO      . oxyCODONE (Oxy IR/ROXICODONE) immediate release tablet 5 mg  5 mg Oral Q4H PRN Hugelmeyer, Alexis, DO   5 mg at 09/26/16 0046  . senna-docusate (Senokot-S) tablet 1 tablet  1 tablet Oral QHS PRN Hugelmeyer, Alexis, DO      . sodium chloride flush (NS) 0.9 % injection 3 mL  3 mL Intravenous Q12H Hugelmeyer, Alexis, DO      . vancomycin (VANCOCIN) 1,500 mg in sodium chloride 0.9 % 500 mL IVPB  1,500 mg Intravenous Q12H Hallaji, Sheema M, RPH 250 mL/hr at 09/26/16 0456 1,500 mg at 09/26/16 0456  . zolpidem (AMBIEN) tablet 5 mg  5 mg Oral QHS PRN,MR X 1 Hugelmeyer, Alexis, DO         Discharge Medications: Please see discharge summary for a list of discharge medications.  Relevant Imaging Results:  Relevant Lab Results:   Additional Information  (SSN: [id number])  Jillian Warth, Veronia Beets, LCSW

## 2016-09-26 NOTE — Op Note (Signed)
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 Introduction catheter into left lower extremity 3rd order catheter placement  2. Contrast injection left lower extremity for distal runoff with additional 3rd order  3. Percutaneous transluminal angioplasty tibioperoneal trunk on the left              4. Star close closure right common femoral arteriotomy  Anesthesia: Conscious sedation was administered under my direct supervision by the interventional radiology RN. IV Versed plus fentanyl were utilized. Continuous ECG, pulse oximetry and blood pressure was monitored throughout the entire procedure.  Conscious sedation was for a total of 1 hour 17 minutes.  Sheath: 6 French Rabi right common femoral  Contrast: 110 cc  Fluoroscopy Time: 7.9 minutes  Indications: Kyle Banks presents with increasing pain associated with ulceration and gangrene of the left lower extremity. This suggests the patient is having limb threatening ischemia. The risks and benefits are reviewed all questions answered patient agrees to proceed.  Procedure:Kyle Banks is a 61 y.o. y.o. male who was identified and appropriate procedural time out was performed. The patient was then placed supine on the table and prepped and draped in the usual sterile fashion.   Ultrasound was placed in the sterile sleeve and the right groin was evaluated the common femoral artery was echolucent and pulsatile indicating patency. Access was then obtained with a microneedle microwire was then inserted followed by micro-sheath however, initial injection demonstrated that  the femoral vein had been accessed. This sheath was then left in place the ultrasound returned to the field a sterile sleeve. Right groin was then re-interrogated. The right common femoral artery was again identified. Image was recorded for the permanent record and under real-time visualization a microneedle was inserted into the common femoral artery followed by the microwire and then the micro-sheath. A J-wire was then advanced through the micro-sheath and a 5 [place] sheath was then inserted over a J-wire. J-wire was then advanced and a 5 French pigtail catheter was positioned at the level of T12.  AP projection of the aorta was then obtained. Pigtail catheter was repositioned to above the bifurcation and a RAO view of the pelvis was obtained. Subsequently a pigtail catheter with the stiff angle Glidewire was used to cross the aortic bifurcation the catheter wire were advanced down into the left distal external iliac artery. Oblique view of the femoral bifurcation was then obtained and subsequently the wire was reintroduced and the pigtail catheter negotiated into the SFA representing third order catheter placement. Distal runoff was then performed.  60% stenosis was noted in the distal tibioperoneal trunk.  5000 units of heparin was then given and allowed to circulate and a Rabi sheath was advanced up and over the bifurcation and positioned in the femoral artery  Straight catheter and stiff angle Glidewire were then negotiated down into the distal popliteal. Catheter was then advanced. Hand injection contrast demonstrated the tibial anatomy in detail.  4 mm x 40 mm Lutonix drug eluding balloon was used to angioplasty the tibioperoneal trunk.  The inflation was for 2 minutes at 12 atm. Follow-up imaging demonstrated excellent patency with less than 10% residual stenosis and preservation of the distal runoff.  Distal runoff was then reassessed and noted to be widely patent.   After  review of these  images the sheath is pulled into the right external iliac oblique of the common femoral is obtained and a Star close device deployed. There no immediate Complications.  Findings: The abdominal aorta is opacified with a bolus injection contrast. Renal arteries are patent. The aorta itself has diffuse disease but no hemodynamically significant lesions. The common and external iliac arteries are widely patent bilaterally.  The left common femoral is widely patent as is the profunda femoris.  The SFA and popliteal are widely patent with minimal atherosclerotic changes.  The trifurcation is patent at its origin. There is a single focal area of diseased within distal tibioperoneal trunk. The posterior tibial artery and the peroneal artery are otherwise patent and filled the foot. The posterior tibial demonstrate perhaps wide patency filling the lateral medial plantar arteries as well as the pedal arch. The anterior tibial artery is widely patent from its origin down to the foot however within the foot itself there is moderate disease of the dorsalis pedis. Given these findings I believe recruiting optimal flow for the posterior tibial which appears to have better intrinsic small vessel filling of the foot would be a distinct advantage. Particularly in light of the fact that he appears to continue to be worsening after his initial debridement and contrast was right foot which is clearly improved.   Following angioplasty the tibioperoneal trunk now is now widely patent and demonstrates in-line flow.  It is much improved and looks quite nice. Angioplasty yields an excellent result with less than 10% residual stenosis.  Summary: Successful recanalization left lower extremity for limb salvage   Disposition: Patient was taken to the recovery room in stable condition having tolerated the procedure well.  Kyle Banks 09/26/2016,4:30 PM

## 2016-09-26 NOTE — Progress Notes (Signed)
[place] INFECTIOUS DISEASE PROGRESS NOTE Date of Admission:  09/23/2016     ID: Kyle Banks is a 61 y.o. male with  Active Problems:   Sepsis due to cellulitis ([place])  Subjective: Fevers improving, still with pain, more alert.   ROS  Eleven systems are reviewed and negative except per hpi  Medications:  Antibiotics Given (last 72 hours)    Date/Time Action Medication Dose Rate   09/23/16 2133 New Bag/Given   piperacillin-tazobactam (ZOSYN) IVPB 3.375 g 3.375 g 100 mL/hr   09/23/16 2147 New Bag/Given   vancomycin (VANCOCIN) IVPB 1000 mg/200 mL premix 1,000 mg 200 mL/hr   09/24/16 0455 New Bag/Given   piperacillin-tazobactam (ZOSYN) IVPB 3.375 g 3.375 g 12.5 mL/hr   09/24/16 0456 New Bag/Given   vancomycin (VANCOCIN) IVPB 1000 mg/200 mL premix 1,000 mg 200 mL/hr   09/24/16 1237 New Bag/Given   piperacillin-tazobactam (ZOSYN) IVPB 3.375 g 3.375 g 12.5 mL/hr   09/24/16 1535 New Bag/Given   vancomycin (VANCOCIN) IVPB 1000 mg/200 mL premix 1,000 mg 200 mL/hr   09/24/16 1702 New Bag/Given   cefTAZidime (FORTAZ) 2 g in dextrose 5 % 50 mL IVPB 2 g 100 mL/hr   09/24/16 1703 Given   metroNIDAZOLE (FLAGYL) tablet 500 mg 500 mg    09/24/16 2111 Given   metroNIDAZOLE (FLAGYL) tablet 500 mg 500 mg    09/24/16 2159 New Bag/Given   cefTAZidime (FORTAZ) 2 g in dextrose 5 % 50 mL IVPB 2 g 100 mL/hr   09/25/16 0407 New Bag/Given   vancomycin (VANCOCIN) IVPB 1000 mg/200 mL premix 1,000 mg 200 mL/hr   09/25/16 0553 Given   metroNIDAZOLE (FLAGYL) tablet 500 mg 500 mg    09/25/16 0553 New Bag/Given   cefTAZidime (FORTAZ) 2 g in dextrose 5 % 50 mL IVPB 2 g 100 mL/hr   09/25/16 1517 Given   metroNIDAZOLE (FLAGYL) tablet 500 mg 500 mg    09/25/16 1656 New Bag/Given   vancomycin (VANCOCIN) 1,500 mg in sodium chloride 0.9 % 500 mL IVPB 1,500 mg 250 mL/hr   09/25/16 2214 Given   metroNIDAZOLE (FLAGYL) tablet 500 mg 500 mg    09/25/16 2215 New Bag/Given   cefTAZidime (FORTAZ) 2 g in dextrose  5 % 50 mL IVPB 2 g 100 mL/hr   09/26/16 0456 New Bag/Given   vancomycin (VANCOCIN) 1,500 mg in sodium chloride 0.9 % 500 mL IVPB 1,500 mg 250 mL/hr   09/26/16 7035 Given   metroNIDAZOLE (FLAGYL) tablet 500 mg 500 mg    09/26/16 0853 New Bag/Given   cefTAZidime (FORTAZ) 2 g in dextrose 5 % 50 mL IVPB 2 g 100 mL/hr   09/26/16 1304 New Bag/Given   cefTAZidime (FORTAZ) 2 g in dextrose 5 % 50 mL IVPB 2 g 100 mL/hr     . aspirin EC  81 mg Oral Daily  . chlorhexidine  60 mL Topical Once  . enoxaparin (LOVENOX) injection  40 mg Subcutaneous Q24H  . fenofibrate  160 mg Oral Daily  . insulin aspart  0-20 Units Subcutaneous Q4H  . insulin aspart  5 Units Subcutaneous TID WC  . [START ON 09/27/2016] insulin glargine  30 Units Subcutaneous Q24H  . insulin glargine  5 Units Subcutaneous Once  . insulin starter kit- pen needles  1 kit Other Once  . metroNIDAZOLE  500 mg Oral Q8H  . sodium chloride flush  3 mL Intravenous Q12H    Objective: Vital signs in last 24 hours: Temp:  [98.4 F (36.9 C)-101  F (38.3 C)] 99.3 F (37.4 C) (06/13 0738) Pulse Rate:  [86-107] 107 (06/13 1036) Resp:  [16-18] 18 (06/13 0738) BP: (132-147)/(75-84) 147/84 (06/13 0738) SpO2:  [92 %-100 %] 92 % (06/13 1036)  Constitutional: He is obese, awake, interactive HENT: anicteric Mouth/Throat: Oropharynx is clear and dry. No oropharyngeal exudate.  Cardiovascular: Tachycardia, regular Pulmonary/Chest: Effort normal and breath sounds normal.  Abdominal: Soft. Obese Bowel sounds are normal. He exhibits no distension. There is no tenderness.  Lymphadenopathy: He has no cervical adenopathy.  Neurological: He is alert and oriented to person, place, and time.  Skin: bil leg wrapped Psychiatric: interactive  Lab Results  Recent Labs  09/25/16 0405 09/26/16 0350  WBC 19.7* 18.4*  HGB 9.2* 9.1*  HCT 27.5* 26.6*  NA 132* 134*  K 3.5 3.6  CL 98* 101  CO2 27 26  BUN 20 16  CREATININE 1.06 0.97   Lab Results   Component Value Date   ESRSEDRATE 107 (H) 09/25/2016   Lab Results  Component Value Date   CRP 35.6 (H) 09/25/2016    Microbiology: Results for orders placed or performed during the hospital encounter of 09/23/16  Blood culture (routine x 2)     Status: None (Preliminary result)   Collection Time: 09/23/16  7:44 PM  Result Value Ref Range Status   Specimen Description BLOOD LEFT ANTECUBITAL  Final   Special Requests   Final    BOTTLES DRAWN AEROBIC AND ANAEROBIC Blood Culture adequate volume   Culture  Setup Time   Final    GRAM POSITIVE COCCI IN BOTH AEROBIC AND ANAEROBIC BOTTLES CRITICAL RESULT CALLED TO, READ BACK BY AND VERIFIED WITH: JASON ROBBINS AT 1608 ON 09/24/2016 JJB    Culture GRAM POSITIVE COCCI  Final   Report Status PENDING  Incomplete  Blood culture (routine x 2)     Status: Abnormal (Preliminary result)   Collection Time: 09/23/16  7:44 PM  Result Value Ref Range Status   Specimen Description BLOOD RIGHT ANTECUBITAL  Final   Special Requests   Final    BOTTLES DRAWN AEROBIC AND ANAEROBIC Blood Culture adequate volume   Culture  Setup Time   Final    GRAM POSITIVE COCCI IN BOTH AEROBIC AND ANAEROBIC BOTTLES CRITICAL RESULT CALLED TO, READ BACK BY AND VERIFIED WITH: JASON ROBBINS AT 6629 ON 09/24/2016 JJB    Culture (A)  Final    GROUP B STREP(S.AGALACTIAE)ISOLATED SUSCEPTIBILITIES TO FOLLOW Performed at [facility], [address], [place], [place] [address]    Report Status PENDING  Incomplete  Blood Culture ID Panel (Reflexed)     Status: Abnormal   Collection Time: 09/23/16  7:44 PM  Result Value Ref Range Status   Enterococcus species NOT DETECTED NOT DETECTED Final   Listeria monocytogenes NOT DETECTED NOT DETECTED Final   Staphylococcus species NOT DETECTED NOT DETECTED Final   Staphylococcus aureus NOT DETECTED NOT DETECTED Final   Streptococcus species DETECTED (A) NOT DETECTED Final    Comment: CRITICAL RESULT CALLED TO, READ BACK BY  AND VERIFIED WITH: JASON ROBBINS AT 1608 ON 09/24/2016 JJB    Streptococcus agalactiae DETECTED (A) NOT DETECTED Final    Comment: CRITICAL RESULT CALLED TO, READ BACK BY AND VERIFIED WITH: JASON ROBBINS AT 1608 ON 09/24/2016 JJB    Streptococcus pneumoniae NOT DETECTED NOT DETECTED Final   Streptococcus pyogenes NOT DETECTED NOT DETECTED Final   Acinetobacter baumannii NOT DETECTED NOT DETECTED Final   Enterobacteriaceae species NOT DETECTED NOT DETECTED  Final   Enterobacter cloacae complex NOT DETECTED NOT DETECTED Final   Escherichia coli NOT DETECTED NOT DETECTED Final   Klebsiella oxytoca NOT DETECTED NOT DETECTED Final   Klebsiella pneumoniae NOT DETECTED NOT DETECTED Final   Proteus species NOT DETECTED NOT DETECTED Final   Serratia marcescens NOT DETECTED NOT DETECTED Final   Haemophilus influenzae NOT DETECTED NOT DETECTED Final   Neisseria meningitidis NOT DETECTED NOT DETECTED Final   Pseudomonas aeruginosa NOT DETECTED NOT DETECTED Final   Candida albicans NOT DETECTED NOT DETECTED Final   Candida glabrata NOT DETECTED NOT DETECTED Final   Candida krusei NOT DETECTED NOT DETECTED Final   Candida parapsilosis NOT DETECTED NOT DETECTED Final   Candida tropicalis NOT DETECTED NOT DETECTED Final  Aerobic/Anaerobic Culture (surgical/deep wound)     Status: None (Preliminary result)   Collection Time: 09/24/16 12:34 AM  Result Value Ref Range Status   Specimen Description WOUND RIGHT GREAT TOE  Final   Special Requests NONE  Final   Gram Stain   Final    RARE WBC PRESENT, PREDOMINANTLY PMN RARE SQUAMOUS EPITHELIAL CELLS PRESENT ABUNDANT GRAM POSITIVE COCCI IN PAIRS MODERATE GRAM NEGATIVE RODS RARE GRAM POSITIVE RODS    Culture   Final    CULTURE REINCUBATED FOR BETTER GROWTH HOLDING FOR POSSIBLE ANAEROBE Performed at [facility], [place] [address] [place], [place] [address]    Report Status PENDING  Incomplete  Aerobic/Anaerobic Culture (surgical/deep wound)      Status: None (Preliminary result)   Collection Time: 09/24/16 12:35 AM  Result Value Ref Range Status   Specimen Description WOUND LEFT GREAT TOE  Final   Special Requests NONE  Final   Gram Stain   Final    FEW WBC PRESENT, PREDOMINANTLY PMN ABUNDANT GRAM NEGATIVE RODS IN PAIRS ABUNDANT GRAM NEGATIVE RODS FEW GRAM POSITIVE RODS    Culture   Final    CULTURE REINCUBATED FOR BETTER GROWTH HOLDING FOR POSSIBLE ANAEROBE Performed at [facility], [place] [address] [place], [place] [address]    Report Status PENDING  Incomplete    Studies/Results: No results found.  Assessment/Plan: Kyle Banks is a 61 y.o. male with bil great toe infections with gas noted in tissue, and wbc 25 and fever 101 on admit. Sugars were >400, cr elevated (unclear baseline).  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  Per op note on the L the infection extended back to dorsal midfoot and extensive debridement was done.  Cultures are pending but given the extent of infection I think he will need IV abx for several weeks. BXC + GBStrep. Wound cx pending. ESR 107, crp 35, A1c 15.3 6/13- vascular surg consulted, podiatry feels will need further debridement on L foot.  Recommendations Continue vanco pending final  culture but if no MRSA found can dc Continue  IV ceftazidime and flagyl for now. I have ordered repeat bcx Will need PICC and IV abx long term - considering [facility] resources for DC Thank you very much for the consult. Will follow with you.  [place], Kyle Banks   09/26/2016, 2:23 PM

## 2016-09-26 NOTE — Progress Notes (Signed)
Patient back from angiogram. Alert and oriented but drowsy. Complaining of moderate pain in left foot. Pressure dressing in place with MD order for bed rest until the am. Dressing clean dry and intact. Vitals stable. Patient sitting in bed eating dinner.   Deri Fuelling, RN

## 2016-09-26 NOTE — Progress Notes (Signed)
CH made a follow visit with Pt. Ch met with Pt and family but could not talk to Pt because he was having a conversation with his family in the Rm. Cyril told Pt and family that he would visit the Pt later. [place] plans to have a follow up visit with Pt at an appropriate time today or tomorrow.    09/26/16 1300  Clinical Encounter Type  Visited With Patient;Patient and family together  Visit Type Follow-up  Referral From Chaplain  Spiritual Encounters  Spiritual Needs Prayer;Emotional;Other (Comment)

## 2016-09-26 NOTE — Progress Notes (Signed)
Patient alert and oriented. Both feet wrapped in bulky dressing. Complaining of moderate pain of the left foot. Heart and lung sounds clear. Patient requesting to not have visitors after 7pm so that he can rest. Informed consent singed for angiogram this morning.  Deri Fuelling, RN

## 2016-09-27 ENCOUNTER — Inpatient Hospital Stay: Payer: 59

## 2016-09-27 ENCOUNTER — Encounter: Payer: Self-pay | Admitting: Vascular Surgery

## 2016-09-27 LAB — CULTURE, BLOOD (ROUTINE X 2)
Special Requests: ADEQUATE
Special Requests: ADEQUATE

## 2016-09-27 LAB — CBC
HCT: 28.5 % — ABNORMAL LOW (ref 40.0–52.0)
HEMOGLOBIN: 9.5 g/dL — AB (ref 13.0–18.0)
MCH: 27.9 pg (ref 26.0–34.0)
MCHC: 33.3 g/dL (ref 32.0–36.0)
MCV: 83.6 fL (ref 80.0–100.0)
PLATELETS: 246 10*3/uL (ref 150–440)
RBC: 3.41 MIL/uL — AB (ref 4.40–5.90)
RDW: 14.4 % (ref 11.5–14.5)
WBC: 15.9 10*3/uL — AB (ref 3.8–10.6)

## 2016-09-27 LAB — VANCOMYCIN, TROUGH: Vancomycin Tr: 7 ug/mL — ABNORMAL LOW (ref 15–20)

## 2016-09-27 LAB — GLUCOSE, CAPILLARY
GLUCOSE-CAPILLARY: 104 mg/dL — AB (ref 65–99)
GLUCOSE-CAPILLARY: 124 mg/dL — AB (ref 65–99)
GLUCOSE-CAPILLARY: 127 mg/dL — AB (ref 65–99)
GLUCOSE-CAPILLARY: 131 mg/dL — AB (ref 65–99)
GLUCOSE-CAPILLARY: 133 mg/dL — AB (ref 65–99)
Glucose-Capillary: 141 mg/dL — ABNORMAL HIGH (ref 65–99)
Glucose-Capillary: 126 mg/dL — ABNORMAL HIGH (ref 65–99)

## 2016-09-27 LAB — MRSA PCR SCREENING: MRSA by PCR: NEGATIVE

## 2016-09-27 MED ORDER — SALINE SPRAY 0.65 % NA SOLN
1.0000 | Freq: Four times a day (QID) | NASAL | Status: DC | PRN
  Filled 2016-09-27: qty 44

## 2016-09-27 MED ORDER — CHLORHEXIDINE GLUCONATE 4 % EX LIQD: 60.0000 mL | Freq: Once | CUTANEOUS | Status: DC

## 2016-09-27 MED ORDER — SENNOSIDES-DOCUSATE SODIUM 8.6-50 MG PO TABS
1.0000 | ORAL_TABLET | Freq: Every day | ORAL | Status: DC
  Administered 2016-09-27 – 2016-09-29 (×3): 1 via ORAL
  Filled 2016-09-27 (×2): qty 1

## 2016-09-27 MED ORDER — DEXTROSE 5 % IV SOLN
2.0000 g | Freq: Three times a day (TID) | INTRAVENOUS | Status: DC
  Administered 2016-09-28: 2 g via INTRAVENOUS
  Filled 2016-09-27 (×3): qty 2

## 2016-09-27 NOTE — Progress Notes (Signed)
PT Cancellation Note  Patient Details Name: Kyle Banks MRN: [id number] DOB: 1955-06-06   Cancelled Treatment:    Reason Eval/Treat Not Completed: Other (comment). Pt with many visitors in the room awaiting to go to surgery. Pt wishes to defer PT until tomorrow. Re attempt tomorrow.   Larae Grooms, PTA 09/27/2016, 3:01 PM

## 2016-09-27 NOTE — Progress Notes (Signed)
Inpatient Diabetes Program Recommendations  AACE/ADA: New Consensus Statement on Inpatient Glycemic Control (2015)  Target Ranges:  Prepandial:   less than 140 mg/dL      Peak postprandial:   less than 180 mg/dL (1-2 hours)      Critically ill patients:  140 - 180 mg/dL   Lab Results  Component Value Date   GLUCAP 133 (H) 09/27/2016   HGBA1C 15.1 (H) 09/25/2016    Review of Glycemic Control  Results for JATINDER, MCDONAGH (MRN [id number]) as of 09/27/2016 10:44  Ref. Range 09/26/2016 16:28 09/26/2016 21:04 09/27/2016 00:03 09/27/2016 04:03 09/27/2016 07:48  Glucose-Capillary Latest Ref Range: 65 - 99 mg/dL 131 (H) 151 (H) 104 (H) 131 (H) 133 (H)    Diabetes history:NO Outpatient Diabetes medications: None Current orders for Inpatient glycemic control: Novolog 0-20 units Q4H, Lantus 30 units qam, Novolog 5 units tid  Inpatient Diabetes Program Recommendations:  Noted - Lantus increased to 30 units- will begin tomorrow am.    * please HOLD the Novolog 5 units tid if the patient eats less than 50%.  Consider changing Novolog 0-20units q4h to Novolog 0-20 units tid and add Novolog 0-5 units qhs.   Gentry Fitz, RN, BA, MHA, CDE Diabetes Coordinator Inpatient Diabetes Program  [phone] (Team Pager) [phone] ([place]) 09/27/2016 10:52 AM

## 2016-09-27 NOTE — Progress Notes (Signed)
Kyle Banks at [place] NAME: Kyle Banks    MR#:  [id number]  DATE OF BIRTH:  [birth year]  Seen today, reports nasal congestion.    No Fever this morning.   CHIEF COMPLAINT:   Chief Complaint  Patient presents with  . Dizziness  . Wound Infection    REVIEW OF SYSTEMS:   ROS CONSTITUTIONAL: No fever, Does have fatigue.  EYES: No blurred or double vision.  EARS, NOSE, AND THROAT: No tinnitus or ear pain.  RESPIRATORY: No cough, shortness of breath, wheezing or hemoptysis.  CARDIOVASCULAR: No chest pain, orthopnea, edema.  GASTROINTESTINAL: No nausea, vomiting, diarrhea or abdominal pain.  GENITOURINARY: No dysuria, hematuria.  ENDOCRINE: No polyuria, nocturia,  HEMATOLOGY: No anemia, easy bruising or bleeding SKIN: No rash or lesion. MUSCULOSKELETAL:Status post amputation  Of both great toes. Dressing present to both feet. NEUROLOGIC: No tingling, numbness, weakness.  PSYCHIATRY: No anxiety or depression.   DRUG ALLERGIES:  No Known Allergies  VITALS:  Blood pressure 126/71, pulse 90, temperature 98.6 F (37 C), temperature source Oral, resp. rate 18, height 5\' 11"  (1.803 m), weight 98.9 kg (218 lb), SpO2 94 %.  PHYSICAL EXAMINATION:  GENERAL:  61 y.o.-year-old patient lying in the bed with no acute distress.  EYES: Pupils equal, round, reactive to light and  accommodation. No scleral icterus. Extraocular muscles intact.  HEENT: Head atraumatic, normocephalic. Oropharynx and nasopharynx clear.  NECK:  Supple, no jugular venous distention. No thyroid enlargement, no tenderness.  LUNGS: Normal breath sounds bilaterally, no wheezing, rales,rhonchi or crepitation. No use of accessory muscles of respiration.  CARDIOVASCULAR: S1, S2 normal. No murmurs, rubs, or gallops.  ABDOMEN: Soft, nontender, nondistended. Bowel sounds present. No organomegaly or mass.  EXTREMITIES;Dressing present to both feet. NEUROLOGIC: Cranial nerves II  through XII are intact. Muscle strength 5/5 in all extremities. Sensation intact. Gait not checked.  PSYCHIATRIC: The patient is alert and oriented x 3.  SKIN: Dressing present to both feet.  LABORATORY PANEL:   CBC  Recent Labs Lab 09/27/16 0334  WBC 15.9*  HGB 9.5*  HCT 28.5*  PLT 246   ------------------------------------------------------------------------------------------------------------------  Chemistries   Recent Labs Lab 09/24/16 0300 09/24/16 0307  09/26/16 0350  NA  --  128*  < > 134*  K  --  4.2  < > 3.6  CL  --  92*  < > 101  CO2  --  23  < > 26  GLUCOSE  --  460*  < > 151*  BUN  --  32*  < > 16  CREATININE  --  1.47*  < > 0.97  CALCIUM  --  8.1*  < > 8.0*  MG 2.0  --   --   --   AST  --  29  --   --   ALT  --  40  --   --   ALKPHOS  --  167*  --   --   BILITOT  --  1.1  --   --   < > = values in this interval not displayed. ------------------------------------------------------------------------------------------------------------------  Cardiac Enzymes No results for input(s): TROPONINI in the last 168 hours. ------------------------------------------------------------------------------------------------------------------  RADIOLOGY:  Dg Foot Complete Left  Result Date: 09/27/2016 CLINICAL DATA:  Fashion suspected of the left foot. History of diabetes. Status post amputation of the left toe on September 23, 2016 EXAM: LEFT FOOT - COMPLETE 3+ VIEW COMPARISON:  Left foot preoperative radiograph of September 23, 2016  FINDINGS: There is considerable soft tissue swelling and gas at the amputation site. Gas extends along the proximal aspect of the first metatarsal soft tissues as well. There is soft tissue swelling of the second and third toes which is not new. No acute bony changes are noted. IMPRESSION: Soft tissue gas compatible with cellulitis at the amputation site and in the proximal mid third up adjacent to the first metatarsal. No bony changes to suggest  osteomyelitis. Electronically Signed   By: Kyle  Banks M.D.   On: 09/27/2016 08:26    EKG:   Orders placed or performed during the hospital encounter of 09/23/16  . ED EKG  . ED EKG  . 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 is on [place], Flagyl since 6/12. DC vancomycin as MRSA PCR is negative Patient is scheduled for her left foot debridement by podiatry tomorrow, nothing by mouth after midnight Peripheral vascular disease, follow-up with vascular surgery, continue aspirin, statins ID is recommending PICC line for long-term IV antibiotics  #2. Diabetes mellitus type 2 uncontrolled: Hemoglobin A1c at 15  Patient has no PCP. Blood sugars were ranging at 400s.  Seen by diabetes coordinator, started on Lantus, NovoLog 3 units 3 times a day,   blood sugar better controlled today. Continue  current  Diabetic regimen.   #. hyponatremia and acute kidney injury: Improving.  #4/,hypoxia due to sepsis: Resolving.   metabolic encephalopathy secondary to sepsis. Improving,   #6 hypertriglyceridemia, unable to calculate LDL because high triglycerides 431. Add TriCor .  All the records are reviewed and case discussed with Care Management/Social Workerr. Management plans discussed with the patient, family and they are in agreement.  CODE STATUS: full  TOTAL TIME TAKING CARE OF THIS PATIENT: 54minutes.   POSSIBLE D/C IN 1-2 DAYS, DEPENDING ON CLINICAL CONDITION.   Kyle Banks M.D on 09/27/2016 at 2:51 PM  Between 7am to 6pm - Pager - [phone]  After 6pm go to [email or web address] - password [email or web address]  Office  [phone]  CC: Primary care physician; Patient, No Pcp Per   Note: This dictation was prepared with  Dragon dictation along with smaller phrase technology. Any transcriptional errors that result from this process are unintentional.

## 2016-09-27 NOTE — Progress Notes (Signed)
Pharmacy Antibiotic Note  Kyle Banks is a 61 y.o. male admitted on 09/23/2016 with sepsis.  Pharmacy was consulted for Vancomycin and Zosyn. ID following. Zosyn has been discontinued. Pharmacy now consulted for Ceftazidime.   Plan: 1. Continue Ceftazidime 2gm IV every 8hours.   2. 6/12 1530 Vanc tough subtherapeutic at 10                 Ke: 0.078   T1/2: 0.89   Vd:69   Will increase to Vancomycin 1500mg  IV Q12H. Calcualted trough at Css is 15. Trough level prior to 4th dose.  Pharmacy will continue to follow and adjust as needed to maintain trough 15 to 20 mcg/mL for bacteremia and cellulitis .  6/14 0330 vanc level 7. Pt missed yesterday afternoon dose due to procedure. Continue current regimen for now and recheck with tomorrow PM dose.   Height: 5\' 11"  (180.3 cm) Weight: 218 lb (98.9 kg) IBW/kg (Calculated) : 75.3  Temp (24hrs), Avg:99 F (37.2 C), Min:98.3 F (36.8 C), Max:99.4 F (37.4 C)   Recent Labs Lab 09/23/16 1944 09/23/16 1947 09/24/16 0300 09/24/16 0307 09/25/16 0405 09/25/16 1523 09/26/16 0350 09/27/16 0334  WBC  --  25.4*  --  26.3* 19.7*  --  18.4* 15.9*  CREATININE  --  1.38*  --  1.47* 1.06  --  0.97  --   LATICACIDVEN 2.8*  --  1.8  --   --   --   --   --   VANCOTROUGH  --   --   --   --   --  10*  --  7*    Estimated Creatinine Clearance: 95.8 mL/min (by C-G formula based on SCr of 0.97 mg/dL).    No Known Allergies  Thank you for allowing pharmacy to be a part of this patient's care.  Estefanie Cornforth S, Pharm.D., BCPS Clinical Pharmacist 09/27/2016 4:48 AM

## 2016-09-27 NOTE — Progress Notes (Signed)
Spoke with the patient this morning regarding his diabetes. He knew who I was (I saw him on Tuesday).   Disscued the use of insulin for home use- he has not had the chance to give himself his own insulin while he has been in hospital. Patient verbalizes that he will be going to a rehab center after the hospital and feels like he has some time to learn how to use it.  Discussed the importance of daily foot checks when he gets home and the importance of improving blood sugar control to aid in healing.  Encouraged RN to use every opportunity to discussed diabetes care with the patient, including treatment of low blood sugar, insulin administration, ideal blood sugar control and the prevention of complications.   Gentry Fitz, RN, BA, MHA, CDE Diabetes Coordinator Inpatient Diabetes Program  [phone] (Team Pager) [phone] ([place]) 09/27/2016 2:06 PM

## 2016-09-27 NOTE — Progress Notes (Signed)
[place] made follow up visit with pt. Pt was having a conversation with family members and friends at the time of this visit this morning. Ch informed Pt that he would visit him later and left.    09/27/16 1100  Clinical Encounter Type  Visited With Patient;Patient and family together  Visit Type Follow-up;Spiritual support;Other (Comment)  Referral From [place] Needs Prayer;Emotional;Other (Comment)

## 2016-09-27 NOTE — Progress Notes (Signed)
Resumed care at 1500. Patient drowsy but easy to aroused. Family in to room. RN requested family to leave at 3pm and for patient to have quiet time from 3-5pm. Patient starting to get disoriented to time, place, and situation at times. Patient is not resting. Note on the door for no visitors. Patient requesting no phone calls to room at this time.  Deri Fuelling, RN

## 2016-09-27 NOTE — Progress Notes (Signed)
Patient continues with intermittent confusion throughout shift today. Blinds open and lights on to stimulate alertness. Patient with incontinence and occasional use of urinal with prompting. Urinal left in reach. Emesis x1 this shift s/p meal intake. Medicated for pain as needed.

## 2016-09-27 NOTE — Progress Notes (Signed)
Per MD note today and RN in progression rounds this morning patient is going back to the OR for debridement. Clinical Education officer, museum (CSW) made [employer] liaison aware of above. Per [facility] authorization has been started and is pending. CSW will continue to follow and assist as needed.   [employer], LCSW [phone]

## 2016-09-27 NOTE — Progress Notes (Signed)
Daily Progress Note   Subjective  - 1 Day Post-Op  Pt underwnet re-vascularization with good residual flow post op.    Objective Vitals:   09/26/16 1639 09/26/16 1645 09/26/16 1700 09/26/16 1737  BP:  139/78 133/80 124/73  Pulse: 89 88 81 85  Resp: 16 (!) 21 (!) 24   Temp:    98.3 F (36.8 C)  TempSrc:    Oral  SpO2: 92% 92% 96% 93%  Weight:      Height:        Physical Exam: Left foot with necrosis dorsal foot.  Still mild purulence from dorsal flap. Areas of superficial blistering just anterior and medial to ankle.  Laboratory CBC    Component Value Date/Time   WBC 15.9 (H) 09/27/2016 0334   HGB 9.5 (L) 09/27/2016 0334   HCT 28.5 (L) 09/27/2016 0334   PLT 246 09/27/2016 0334    BMET    Component Value Date/Time   NA 134 (L) 09/26/2016 0350   K 3.6 09/26/2016 0350   CL 101 09/26/2016 0350   CO2 26 09/26/2016 0350   GLUCOSE 151 (H) 09/26/2016 0350   BUN 16 09/26/2016 0350   CREATININE 0.97 09/26/2016 0350   CALCIUM 8.0 (L) 09/26/2016 0350   GFRNONAA >60 09/26/2016 0350   GFRAA >60 09/26/2016 0350    Assessment/Planning: Necrosis with infection left foot   Will plan for debridment tomorrow of left foot  D/w pt r/b/a/c and consent given  Xray left foot.   Areas of blistering still concerning for further tissue loss and pt still at risk of further amputation.    Samara Deist A  09/27/2016, 7:59 AM

## 2016-09-27 NOTE — Progress Notes (Signed)
Patient resting peacefully. Not taking any visitors at this time. Having some nausea, RN gave Zofran. Patient refusing dinner, drinking ginger ale.   Deri Fuelling, RN

## 2016-09-28 ENCOUNTER — Inpatient Hospital Stay: Payer: 59 | Admitting: Anesthesiology

## 2016-09-28 ENCOUNTER — Encounter
Admission: EM | Disposition: A | Discharge status: Skilled Nursing Facility | Payer: Self-pay | Source: Home / Self Care | Attending: Internal Medicine

## 2016-09-28 ENCOUNTER — Encounter: Payer: Self-pay | Admitting: Anesthesiology

## 2016-09-28 HISTORY — PX: IRRIGATION AND DEBRIDEMENT FOOT: SHX6602

## 2016-09-28 LAB — BASIC METABOLIC PANEL
Anion gap: 6 (ref 5–15)
BUN: 11 mg/dL (ref 6–20)
CALCIUM: 7.7 mg/dL — AB (ref 8.9–10.3)
CHLORIDE: 102 mmol/L (ref 101–111)
CO2: 28 mmol/L (ref 22–32)
CREATININE: 0.94 mg/dL (ref 0.61–1.24)
GFR calc non Af Amer: >60 mL/min (ref >60)
Glucose, Bld: 120 mg/dL — ABNORMAL HIGH (ref 65–99)
Potassium: 2.9 mmol/L — ABNORMAL LOW (ref 3.5–5.1)
SODIUM: 136 mmol/L (ref 135–145)

## 2016-09-28 LAB — CBC WITH DIFFERENTIAL/PLATELET
BAND NEUTROPHILS: 0 %
BASOS PCT: 0 %
Basophils Absolute: 0 10*3/uL (ref 0–0.1)
Blasts: 0 %
EOS ABS: 0.2 10*3/uL (ref 0–0.7)
EOS PCT: 1 %
HCT: 27.7 % — ABNORMAL LOW (ref 40.0–52.0)
Hemoglobin: 9.5 g/dL — ABNORMAL LOW (ref 13.0–18.0)
LYMPHS ABS: 0.5 10*3/uL — AB (ref 1.0–3.6)
LYMPHS PCT: 3 %
MCH: 28.3 pg (ref 26.0–34.0)
MCHC: 34.2 g/dL (ref 32.0–36.0)
MCV: 82.9 fL (ref 80.0–100.0)
MONO ABS: 2.5 10*3/uL — AB (ref 0.2–1.0)
MONOS PCT: 15 %
Metamyelocytes Relative: 2 %
Myelocytes: 0 %
NEUTROS ABS: 13.3 10*3/uL — AB (ref 1.4–6.5)
NEUTROS PCT: 79 %
NRBC: 0 /100{WBCs}
OTHER: 0 %
PLATELETS: 278 10*3/uL (ref 150–440)
Promyelocytes Absolute: 0 %
RBC: 3.34 MIL/uL — ABNORMAL LOW (ref 4.40–5.90)
RDW: 14.4 % (ref 11.5–14.5)
WBC: 16.5 10*3/uL — ABNORMAL HIGH (ref 3.8–10.6)

## 2016-09-28 LAB — GLUCOSE, CAPILLARY
GLUCOSE-CAPILLARY: 113 mg/dL — AB (ref 65–99)
GLUCOSE-CAPILLARY: 137 mg/dL — AB (ref 65–99)
Glucose-Capillary: 128 mg/dL — ABNORMAL HIGH (ref 65–99)
Glucose-Capillary: 135 mg/dL — ABNORMAL HIGH (ref 65–99)
Glucose-Capillary: 136 mg/dL — ABNORMAL HIGH (ref 65–99)
Glucose-Capillary: 146 mg/dL — ABNORMAL HIGH (ref 65–99)
Glucose-Capillary: 148 mg/dL — ABNORMAL HIGH (ref 65–99)

## 2016-09-28 LAB — AEROBIC/ANAEROBIC CULTURE W GRAM STAIN (SURGICAL/DEEP WOUND)

## 2016-09-28 LAB — POTASSIUM: Potassium: 3.7 mmol/L (ref 3.5–5.1)

## 2016-09-28 SURGERY — IRRIGATION AND DEBRIDEMENT FOOT
Anesthesia: General | Laterality: Left | Wound class: Dirty or Infected

## 2016-09-28 MED ORDER — PROPOFOL 10 MG/ML IV BOLUS
INTRAVENOUS | Status: DC | PRN
  Administered 2016-09-28: 120 mg via INTRAVENOUS

## 2016-09-28 MED ORDER — OXYCODONE HCL 5 MG/5ML PO SOLN: 5.0000 mg | Freq: Once | ORAL | Status: DC | PRN

## 2016-09-28 MED ORDER — SODIUM CHLORIDE 0.9% FLUSH: 10.0000 mL | INTRAVENOUS | Status: DC | PRN

## 2016-09-28 MED ORDER — HYDROMORPHONE HCL 1 MG/ML IJ SOLN
INTRAMUSCULAR | Status: AC
  Filled 2016-09-28: qty 1

## 2016-09-28 MED ORDER — PROMETHAZINE HCL 25 MG/ML IJ SOLN: 6.2500 mg | INTRAMUSCULAR | Status: DC | PRN

## 2016-09-28 MED ORDER — ONDANSETRON HCL 4 MG/2ML IJ SOLN
INTRAMUSCULAR | Status: DC | PRN
  Administered 2016-09-28: 4 mg via INTRAVENOUS

## 2016-09-28 MED ORDER — PROPOFOL 10 MG/ML IV BOLUS
INTRAVENOUS | Status: AC
  Filled 2016-09-28: qty 20

## 2016-09-28 MED ORDER — PROPOFOL 10 MG/ML IV BOLUS
INTRAVENOUS | Status: AC
Start: 2016-09-28 — End: 2016-09-28
  Filled 2016-09-28: qty 20

## 2016-09-28 MED ORDER — LIDOCAINE HCL (PF) 1 % IJ SOLN
INTRAMUSCULAR | Status: AC
  Filled 2016-09-28: qty 30

## 2016-09-28 MED ORDER — FENTANYL CITRATE (PF) 100 MCG/2ML IJ SOLN
INTRAMUSCULAR | Status: AC
  Filled 2016-09-28: qty 2

## 2016-09-28 MED ORDER — CEFTAZIDIME AND DEXTROSE 2 GM/50ML IV SOLR
2.0000 g | Freq: Three times a day (TID) | INTRAVENOUS | Status: DC
  Filled 2016-09-28: qty 50

## 2016-09-28 MED ORDER — ONDANSETRON HCL 4 MG/2ML IJ SOLN
INTRAMUSCULAR | Status: AC
  Filled 2016-09-28: qty 2

## 2016-09-28 MED ORDER — OXYCODONE HCL 5 MG PO TABS: 5.0000 mg | ORAL_TABLET | Freq: Once | ORAL | Status: DC | PRN

## 2016-09-28 MED ORDER — HYDROMORPHONE HCL 1 MG/ML IJ SOLN
0.2500 mg | INTRAMUSCULAR | Status: DC | PRN
  Administered 2016-09-28 (×2): 0.5 mg via INTRAVENOUS

## 2016-09-28 MED ORDER — FENTANYL CITRATE (PF) 100 MCG/2ML IJ SOLN
INTRAMUSCULAR | Status: DC | PRN
Start: 2016-09-28 — End: 2016-09-28
  Administered 2016-09-28 (×4): 50 ug via INTRAVENOUS

## 2016-09-28 MED ORDER — SODIUM CHLORIDE 0.9% FLUSH
10.0000 mL | Freq: Two times a day (BID) | INTRAVENOUS | Status: DC
  Administered 2016-09-29 – 2016-10-08 (×13): 10 mL

## 2016-09-28 MED ORDER — MEPERIDINE HCL 50 MG/ML IJ SOLN: 6.2500 mg | INTRAMUSCULAR | Status: DC | PRN

## 2016-09-28 MED ORDER — LIDOCAINE HCL (CARDIAC) 20 MG/ML IV SOLN
INTRAVENOUS | Status: DC | PRN
  Administered 2016-09-28: 50 mg via INTRAVENOUS

## 2016-09-28 MED ORDER — LIDOCAINE-EPINEPHRINE 1 %-1:100000 IJ SOLN
INTRAMUSCULAR | Status: AC
  Filled 2016-09-28: qty 1

## 2016-09-28 MED ORDER — BUPIVACAINE HCL (PF) 0.5 % IJ SOLN
INTRAMUSCULAR | Status: DC | PRN
  Administered 2016-09-28: 10 mL

## 2016-09-28 MED ORDER — PIPERACILLIN-TAZOBACTAM 3.375 G IVPB
3.3750 g | Freq: Three times a day (TID) | INTRAVENOUS | Status: DC
  Administered 2016-09-28 – 2016-10-10 (×35): 3.375 g via INTRAVENOUS
  Filled 2016-09-28 (×45): qty 50

## 2016-09-28 MED ORDER — PHENYLEPHRINE HCL 10 MG/ML IJ SOLN
INTRAMUSCULAR | Status: DC | PRN
  Administered 2016-09-28 (×3): 100 ug via INTRAVENOUS

## 2016-09-28 MED ORDER — FENTANYL CITRATE (PF) 100 MCG/2ML IJ SOLN
25.0000 ug | INTRAMUSCULAR | Status: DC | PRN
  Administered 2016-09-28 (×2): 25 ug via INTRAVENOUS
  Administered 2016-09-28: 50 ug via INTRAVENOUS

## 2016-09-28 MED ORDER — BUPIVACAINE HCL (PF) 0.5 % IJ SOLN
INTRAMUSCULAR | Status: AC
  Filled 2016-09-28: qty 30

## 2016-09-28 MED ORDER — POTASSIUM CHLORIDE CRYS ER 20 MEQ PO TBCR
40.0000 meq | EXTENDED_RELEASE_TABLET | ORAL | Status: AC
  Administered 2016-09-28 (×2): 40 meq via ORAL
  Filled 2016-09-28 (×2): qty 2

## 2016-09-28 MED ORDER — POTASSIUM CHLORIDE 10 MEQ/100ML IV SOLN
10.0000 meq | INTRAVENOUS | Status: AC
  Administered 2016-09-28 (×4): 10 meq via INTRAVENOUS
  Filled 2016-09-28 (×4): qty 100

## 2016-09-28 MED ORDER — LIDOCAINE HCL (PF) 2 % IJ SOLN
INTRAMUSCULAR | Status: AC
  Filled 2016-09-28: qty 2

## 2016-09-28 SURGICAL SUPPLY — 68 items
BANDAGE ACE 4X5 VEL STRL LF (GAUZE/BANDAGES/DRESSINGS) ×2 IMPLANT
BANDAGE STRETCH 3X4.1 STRL (GAUZE/BANDAGES/DRESSINGS) IMPLANT
BLADE OSC/SAGITTAL MD 5.5X18 (BLADE) IMPLANT
BLADE OSCILLATING/SAGITTAL (BLADE)
BLADE SW THK.38XMED LNG THN (BLADE) IMPLANT
BNDG COHESIVE 4X5 TAN STRL (GAUZE/BANDAGES/DRESSINGS) ×2 IMPLANT
BNDG COHESIVE 6X5 TAN STRL LF (GAUZE/BANDAGES/DRESSINGS) ×2 IMPLANT
BNDG ESMARK 4X12 TAN STRL LF (GAUZE/BANDAGES/DRESSINGS) ×2 IMPLANT
BNDG GAUZE 4.5X4.1 6PLY STRL (MISCELLANEOUS) ×4 IMPLANT
CANISTER SUCT 1200ML W/VALVE (MISCELLANEOUS) ×2 IMPLANT
CANISTER SUCT 3000ML PPV (MISCELLANEOUS) ×2 IMPLANT
CUFF TOURN 18 STER (MISCELLANEOUS) IMPLANT
CUFF TOURN DUAL PL 12 NO SLV (MISCELLANEOUS) IMPLANT
DRAPE FLUOR MINI C-ARM 54X84 (DRAPES) IMPLANT
DRAPE XRAY CASSETTE 23X24 (DRAPES) IMPLANT
DRESSING ALLEVYN 4X4 (MISCELLANEOUS) IMPLANT
DRSG VAC ATS MED SENSATRAC (GAUZE/BANDAGES/DRESSINGS) IMPLANT
DURAPREP 26ML APPLICATOR (WOUND CARE) ×2 IMPLANT
ELECT REM PT RETURN 9FT ADLT (ELECTROSURGICAL) ×2
ELECTRODE REM PT RTRN 9FT ADLT (ELECTROSURGICAL) ×1 IMPLANT
GAUZE PACKING 1/4 X5 YD (GAUZE/BANDAGES/DRESSINGS) IMPLANT
GAUZE PACKING IODOFORM 1X5 (MISCELLANEOUS) ×2 IMPLANT
GAUZE PETRO XEROFOAM 1X8 (MISCELLANEOUS) ×2 IMPLANT
GAUZE SPONGE 4X4 12PLY STRL (GAUZE/BANDAGES/DRESSINGS) ×4 IMPLANT
GAUZE STRETCH 2X75IN STRL (MISCELLANEOUS) IMPLANT
GLOVE BIO SURGEON STRL SZ7.5 (GLOVE) ×6 IMPLANT
GLOVE INDICATOR 8.0 STRL GRN (GLOVE) ×6 IMPLANT
GOWN STRL REUS W/ TWL LRG LVL3 (GOWN DISPOSABLE) ×2 IMPLANT
GOWN STRL REUS W/TWL LRG LVL3 (GOWN DISPOSABLE) ×2
GOWN STRL REUS W/TWL MED LVL3 (GOWN DISPOSABLE) ×6 IMPLANT
HANDLE YANKAUER SUCT BULB TIP (MISCELLANEOUS) ×2 IMPLANT
HANDPIECE INTERPULSE COAX TIP (DISPOSABLE)
HANDPIECE VERSAJET DEBRIDEMENT (MISCELLANEOUS) ×2 IMPLANT
IV NS 1000ML (IV SOLUTION) ×1
IV NS 1000ML BAXH (IV SOLUTION) ×1 IMPLANT
KIT DRSG VAC SLVR GRANUFM (MISCELLANEOUS) ×2 IMPLANT
KIT RM TURNOVER STRD PROC AR (KITS) ×2 IMPLANT
LABEL OR SOLS (LABEL) ×2 IMPLANT
NDL SAFETY ECLIPSE 18X1.5 (NEEDLE) ×1 IMPLANT
NEEDLE FILTER BLUNT 18X 1/2SAF (NEEDLE) ×1
NEEDLE FILTER BLUNT 18X1 1/2 (NEEDLE) ×1 IMPLANT
NEEDLE HYPO 18GX1.5 SHARP (NEEDLE) ×1
NEEDLE HYPO 25X1 1.5 SAFETY (NEEDLE) ×4 IMPLANT
NS IRRIG 1000ML POUR BTL (IV SOLUTION) ×4 IMPLANT
NS IRRIG 500ML POUR BTL (IV SOLUTION) ×2 IMPLANT
PACK EXTREMITY ARMC (MISCELLANEOUS) ×2 IMPLANT
PAD ABD DERMACEA PRESS 5X9 (GAUZE/BANDAGES/DRESSINGS) ×4 IMPLANT
RASP SM TEAR CROSS CUT (RASP) IMPLANT
SET HNDPC FAN SPRY TIP SCT (DISPOSABLE) IMPLANT
SOL .9 NS 3000ML IRR  AL (IV SOLUTION)
SOL .9 NS 3000ML IRR UROMATIC (IV SOLUTION) IMPLANT
SOL PREP PVP 2OZ (MISCELLANEOUS)
SOLUTION PREP PVP 2OZ (MISCELLANEOUS) IMPLANT
STOCKINETTE IMPERVIOUS 9X36 MD (GAUZE/BANDAGES/DRESSINGS) ×2 IMPLANT
SUT ETHILON 2 0 FS 18 (SUTURE) IMPLANT
SUT ETHILON 3-0 FS-10 30 BLK (SUTURE) ×4
SUT ETHILON 4-0 (SUTURE)
SUT ETHILON 4-0 FS2 18XMFL BLK (SUTURE)
SUT VIC AB 3-0 SH 27 (SUTURE)
SUT VIC AB 3-0 SH 27X BRD (SUTURE) IMPLANT
SUT VIC AB 4-0 FS2 27 (SUTURE) IMPLANT
SUTURE EHLN 3-0 FS-10 30 BLK (SUTURE) ×2 IMPLANT
SUTURE ETHLN 4-0 FS2 18XMF BLK (SUTURE) IMPLANT
SWAB CULTURE AMIES ANAERIB BLU (MISCELLANEOUS) ×2 IMPLANT
SYR 10ML LL (SYRINGE) ×2 IMPLANT
SYR 3ML LL SCALE MARK (SYRINGE) ×2 IMPLANT
SYRINGE 10CC LL (SYRINGE) ×4 IMPLANT
WND VAC CANISTER 500ML (MISCELLANEOUS) ×2 IMPLANT

## 2016-09-28 NOTE — Progress Notes (Signed)
[place] at [place] NAME: Kyle Banks    MR#:  [id number]  DATE OF BIRTH:  [birth year]  Seen today, reports nasal congestion.    No Fever this morning. Awaiting for left foot debridement today  CHIEF COMPLAINT:   Chief Complaint  Patient presents with  . Dizziness  . Wound Infection    REVIEW OF SYSTEMS:   ROS CONSTITUTIONAL: No fever, Does have fatigue.  EYES: No blurred or double vision.  EARS, NOSE, AND THROAT: No tinnitus or ear pain.  RESPIRATORY: No cough, shortness of breath, wheezing or hemoptysis.  CARDIOVASCULAR: No chest pain, orthopnea, edema.  GASTROINTESTINAL: No nausea, vomiting, diarrhea or abdominal pain.  GENITOURINARY: No dysuria, hematuria.  ENDOCRINE: No polyuria, nocturia,  HEMATOLOGY: No anemia, easy bruising or bleeding SKIN: No rash or lesion. MUSCULOSKELETAL:Status post amputation  Of both great toes. Dressing present to both feet. NEUROLOGIC: No tingling, numbness, weakness.  PSYCHIATRY: No anxiety or depression.   DRUG ALLERGIES:  No Known Allergies  VITALS:  Blood pressure (!) 158/83, pulse 85, temperature 98.4 F (36.9 C), temperature source Oral, resp. rate 20, height 5\' 11"  (1.803 m), weight 98.9 kg (218 lb), SpO2 95 %.  PHYSICAL EXAMINATION:  GENERAL:  61 y.o.-year-old patient lying in the bed with no acute distress.  EYES: Pupils equal, round, reactive to light and  accommodation. No scleral icterus. Extraocular muscles intact.  HEENT: Head atraumatic, normocephalic. Oropharynx and nasopharynx clear.  NECK:  Supple, no jugular venous distention. No thyroid enlargement, no tenderness.  LUNGS: Normal breath sounds bilaterally, no wheezing, rales,rhonchi or crepitation. No use of accessory muscles of respiration.  CARDIOVASCULAR: S1, S2 normal. No murmurs, rubs, or gallops.  ABDOMEN: Soft, nontender, nondistended. Bowel sounds present. No organomegaly or mass.  EXTREMITIES;Dressing  present to both feet. NEUROLOGIC: Cranial nerves II through XII are intact. Muscle strength 5/5 in all extremities. Sensation intact. Gait not checked.  PSYCHIATRIC: The patient is alert and oriented x 3.  SKIN: Dressing present to both feet.  LABORATORY PANEL:   CBC  Recent Labs Lab 09/28/16 0522  WBC 16.5*  HGB 9.5*  HCT 27.7*  PLT 278   ------------------------------------------------------------------------------------------------------------------  Chemistries   Recent Labs Lab 09/24/16 0300 09/24/16 0307  09/28/16 0522 09/28/16 1431  NA  --  128*  < > 136  --   K  --  4.2  < > 2.9* 3.7  CL  --  92*  < > 102  --   CO2  --  23  < > 28  --   GLUCOSE  --  460*  < > 120*  --   BUN  --  32*  < > 11  --   CREATININE  --  1.47*  < > 0.94  --   CALCIUM  --  8.1*  < > 7.7*  --   MG 2.0  --   --   --   --   AST  --  29  --   --   --   ALT  --  40  --   --   --   ALKPHOS  --  167*  --   --   --   BILITOT  --  1.1  --   --   --   < > = values in this interval not displayed. ------------------------------------------------------------------------------------------------------------------  Cardiac Enzymes No results for input(s): TROPONINI in the last 168 hours. ------------------------------------------------------------------------------------------------------------------  RADIOLOGY:  Dg Foot Complete  Left  Result Date: 09/27/2016 CLINICAL DATA:  Fashion suspected of the left foot. History of diabetes. 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 Gas extends along the proximal aspect of the first metatarsal soft tissues as well. There is soft tissue swelling of the second and third toes which is not new. No acute bony changes are noted. IMPRESSION: Soft tissue gas compatible with  cellulitis at the amputation site and in the proximal mid third up adjacent to the first metatarsal. No bony changes to suggest osteomyelitis. Electronically Signed   By: David  Martinique M.D.   On: 09/27/2016 08:26    EKG:   Orders placed or performed during the hospital encounter of 09/23/16  . ED EKG  . ED EKG  . 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 Blood cultures showed strepagalactiae,1 culture showed gram-negative rods, gram-positive rods, final rpt  cultures with no growth. is on [place], Flagyl since 6/12.changing antibiotics to IV Zosyn as recommended by infectious disease DC vancomycin as MRSA PCR is negative Patient is scheduled for her left foot debridement by podiatry today,  follow-up with vascular surgery, continue aspirin, statins  in#2. Diabetes mellitus type 2 uncontrolled: Hemoglobin A1c at 15  Patient has no PCP. Blood sugars were ranging at 400s.  Seen by diabetes coordinator, started on Lantus, NovoLog 3 units 3 times a day,   blood sugar better controlled today. Continue  current  Diabetic regimen.   #. hyponatremia and acute kidney injury: Improving.  #4/,hypoxia due to sepsis: Resolved   metabolic encephalopathy secondary to sepsis.resolved  #6 hypertriglyceridemia, unable to calculate LDL because high triglycerides 431. Add TriCor .  All the records are reviewed and case discussed with Care Management/Social Workerr. Management plans discussed with the patient, family and they are in agreement.  CODE STATUS: full  TOTAL TIME TAKING CARE OF THIS PATIENT: 29minutes.   POSSIBLE D/C IN 1-2 DAYS, DEPENDING ON CLINICAL CONDITION.   Nicholes Mango M.D on 09/28/2016 at 3:25 PM  Between 7am to 6pm - Pager - [phone]  After 6pm go to [email or web address] - password [email or web address]  Office  [phone]  CC: Primary  care physician; Patient, No Pcp Per   Note: This dictation was prepared with Dragon dictation along with smaller phrase technology. Any transcriptional errors that result from this process are unintentional.

## 2016-09-28 NOTE — OR Nursing (Signed)
Spoke with Dr Marcello Moores and he would like potassium repeated after course of treatment today.  Worthy Rancher RN notified.

## 2016-09-28 NOTE — Progress Notes (Signed)
PT Cancellation Note  Patient Details Name: Kyle Banks MRN: [id number] DOB: 03/15/1956   Cancelled Treatment:    Reason Eval/Treat Not Completed: Medical issues which prohibited therapy. Chart reviewed. Pt is scheduled to go back to the OR today for debridement of left foot. Per podiatry note areas of blistering still concerning for further tissue loss and pt still at risk of further amputation. If pt undergoes general anesthesia will need new orders for PT. Will likely need updated WB status for L foot following debridement. No PT treatment today. Will continue to follow.  Lyndel Safe Huprich PT, DPT   Huprich,Jason 09/28/2016, 9:31 AM

## 2016-09-28 NOTE — Anesthesia Post-op Follow-up Note (Cosign Needed)
Anesthesia QCDR form completed.        

## 2016-09-28 NOTE — Anesthesia Postprocedure Evaluation (Signed)
Anesthesia Post Note  Patient: Jilberto Vanderwall  Procedure(s) Performed: Procedure(s) (LRB): IRRIGATION AND DEBRIDEMENT FOOT (Left)  Patient location during evaluation: PACU Anesthesia Type: General Level of consciousness: awake and alert and oriented Pain management: pain level controlled Vital Signs Assessment: post-procedure vital signs reviewed and stable Respiratory status: spontaneous breathing, nonlabored ventilation and respiratory function stable Cardiovascular status: blood pressure returned to baseline and stable Postop Assessment: no signs of nausea or vomiting Anesthetic complications: no     Last Vitals:  Vitals:   09/28/16 1750 09/28/16 1755  BP:  128/80  Pulse: 83 83  Resp: 14 14  Temp:  36.8 C    Last Pain:  Vitals:   09/28/16 1755  TempSrc:   PainSc: Asleep                 Minette Manders

## 2016-09-28 NOTE — Progress Notes (Signed)
Pharmacy Antibiotic Note  Kyle Banks is a 61 y.o. male admitted on 09/23/2016 with sepsis.  Pharmacy was consulted for Zosyn. ID following.  Plan: Zosyn 3.375 g IV q8h EI  Height: 5\' 11"  (180.3 cm) Weight: 218 lb (98.9 kg) IBW/kg (Calculated) : 75.3  Temp (24hrs), Avg:99 F (37.2 C), Min:98.4 F (36.9 C), Max:99.6 F (37.6 C)   Recent Labs Lab 09/23/16 1944  09/23/16 1947 09/24/16 0300 09/24/16 0307 09/25/16 0405 09/25/16 1523 09/26/16 0350 09/27/16 0334 09/28/16 0522  WBC  --   < > 25.4*  --  26.3* 19.7*  --  18.4* 15.9* 16.5*  CREATININE  --   --  1.38*  --  1.47* 1.06  --  0.97  --  0.94  LATICACIDVEN 2.8*  --   --  1.8  --   --   --   --   --   --   VANCOTROUGH  --   --   --   --   --   --  10*  --  7*  --   < > = values in this interval not displayed.  Estimated Creatinine Clearance: 98.9 mL/min (by C-G formula based on SCr of 0.94 mg/dL).    No Known Allergies  Thank you for allowing pharmacy to be a part of this patient's care.  Rocky Morel, Pharm.D., BCPS Clinical Pharmacist 09/28/2016 2:16 PM

## 2016-09-28 NOTE — Progress Notes (Signed)
Pharmacy Antibiotic Note  Kyle Banks is a 61 y.o. male admitted on 09/23/2016 with sepsis.  Pharmacy was consulted for Vancomycin and Zosyn. ID following. Zosyn has been discontinued. Pharmacy now consulted for Ceftazidime. Also on Flagyl.   Plan: 1. Continue Ceftazidime 2gm IV every 8hours.   2. 6/12 1530 Vanc tough subtherapeutic at 10                 Ke: 0.078   T1/2: 0.89   Vd:69   Will increase to Vancomycin 1500mg  IV Q12H. Calcualted trough at Css is 15. Trough level prior to 4th dose.  Pharmacy will continue to follow and adjust as needed to maintain trough 15 to 20 mcg/mL for bacteremia and cellulitis .  6/14 0330 vanc level 7. Pt missed yesterday afternoon dose due to procedure. Continue current regimen for now and recheck at 6/15 1530   Height: 5\' 11"  (180.3 cm) Weight: 218 lb (98.9 kg) IBW/kg (Calculated) : 75.3  Temp (24hrs), Avg:99.6 F (37.6 C), Min:99.6 F (37.6 C), Max:99.6 F (37.6 C)   Recent Labs Lab 09/23/16 1944  09/23/16 1947 09/24/16 0300 09/24/16 0307 09/25/16 0405 09/25/16 1523 09/26/16 0350 09/27/16 0334 09/28/16 0522  WBC  --   < > 25.4*  --  26.3* 19.7*  --  18.4* 15.9* 16.5*  CREATININE  --   --  1.38*  --  1.47* 1.06  --  0.97  --  0.94  LATICACIDVEN 2.8*  --   --  1.8  --   --   --   --   --   --   VANCOTROUGH  --   --   --   --   --   --  10*  --  7*  --   < > = values in this interval not displayed.  Estimated Creatinine Clearance: 98.9 mL/min (by C-G formula based on SCr of 0.94 mg/dL).    No Known Allergies  Thank you for allowing pharmacy to be a part of this patient's care.  Rocky Morel, Pharm.D., BCPS Clinical Pharmacist 09/28/2016 9:01 AM

## 2016-09-28 NOTE — Progress Notes (Signed)
Infectious Disease Long Term IV Antibiotic Orders  Diagnosis: Grp B strep bacteremia and diabetic foot infection  Culture results Wound - MSSA, Grp B strep, anaerobes Blood - Grp B Strep  Lab Results  Component Value Date   ESRSEDRATE 107 (H) 09/25/2016   Lab Results  Component Value Date   CREATININE 0.94 09/28/2016   Lab Results  Component Value Date   CRP 35.6 (H) 09/25/2016   Lab Results  Component Value Date   WBC 16.5 (H) 09/28/2016   HGB 9.5 (L) 09/28/2016   HCT 27.7 (L) 09/28/2016   MCV 82.9 09/28/2016   PLT 278 09/28/2016    Allergies: No Known Allergies  Discharge antibiotics FAX weekly labs to [phone] Zosyn 3/375  grams every 8 hours PICC Care per protocol Labs weekly while on IV antibiotics      CBC w diff   Comprehensive met panel ESR CRP  Planned duration of antibiotics 3-4 weeks Stop date July 9  Follow up clinic date approx July 6  Leonel Ramsay, MD

## 2016-09-28 NOTE — Progress Notes (Signed)
[place] INFECTIOUS DISEASE PROGRESS NOTE Date of Admission:  09/23/2016     ID: Kyle Banks is a 61 y.o. male with  Active Problems:   Sepsis due to cellulitis [facility])  Subjective: Repeat surgery for  6/15. Had revasc 6/13. No fevers. Wbc down to 16  ROS  Eleven systems are reviewed and negative except per hpi  Medications:  Antibiotics Given (last 72 hours)    Date/Time Action Medication Dose Rate   09/25/16 1517 Given   metroNIDAZOLE (FLAGYL) tablet 500 mg 500 mg    09/25/16 1656 New Bag/Given   vancomycin (VANCOCIN) 1,500 mg in sodium chloride 0.9 % 500 mL IVPB 1,500 mg 250 mL/hr   09/25/16 2214 Given   metroNIDAZOLE (FLAGYL) tablet 500 mg 500 mg    09/25/16 2215 New Bag/Given   cefTAZidime (FORTAZ) 2 g in dextrose 5 % 50 mL IVPB 2 g 100 mL/hr   09/26/16 0456 New Bag/Given   vancomycin (VANCOCIN) 1,500 mg in sodium chloride 0.9 % 500 mL IVPB 1,500 mg 250 mL/hr   09/26/16 5784 Given   metroNIDAZOLE (FLAGYL) tablet 500 mg 500 mg    09/26/16 0853 New Bag/Given   cefTAZidime (FORTAZ) 2 g in dextrose 5 % 50 mL IVPB 2 g 100 mL/hr   09/26/16 1304 New Bag/Given   cefTAZidime (FORTAZ) 2 g in dextrose 5 % 50 mL IVPB 2 g 100 mL/hr   09/26/16 1736 Given   metroNIDAZOLE (FLAGYL) tablet 500 mg 500 mg    09/26/16 2114 New Bag/Given   ceftAZIDime (FORTAZ) 2 gram/50 mL D5W IVPB - DUPLEX 2 g 100 mL/hr   09/26/16 2115 Given   metroNIDAZOLE (FLAGYL) tablet 500 mg 500 mg    09/27/16 0512 New Bag/Given   vancomycin (VANCOCIN) 1,500 mg in sodium chloride 0.9 % 500 mL IVPB 1,500 mg 250 mL/hr   09/27/16 0521 Given   metroNIDAZOLE (FLAGYL) tablet 500 mg 500 mg    09/27/16 0819 New Bag/Given  [IV vanc still infusing pt has one IV site ]   ceftAZIDime (FORTAZ) 2 gram/50 mL D5W IVPB - DUPLEX 2 g 100 mL/hr   09/27/16 1516 New Bag/Given   ceftAZIDime (FORTAZ) 2 gram/50 mL D5W IVPB - DUPLEX 2 g 100 mL/hr   09/27/16 1707 Given   metroNIDAZOLE (FLAGYL) tablet 500 mg 500 mg    09/27/16 1707  New Bag/Given   vancomycin (VANCOCIN) 1,500 mg in sodium chloride 0.9 % 500 mL IVPB 1,500 mg 250 mL/hr   09/27/16 2021 Given   metroNIDAZOLE (FLAGYL) tablet 500 mg 500 mg    09/28/16 0506 New Bag/Given   cefTAZidime (FORTAZ) 2 g in dextrose 5 % 50 mL IVPB 2 g 100 mL/hr   09/28/16 0507 New Bag/Given   vancomycin (VANCOCIN) 1,500 mg in sodium chloride 0.9 % 500 mL IVPB 1,500 mg 250 mL/hr   09/28/16 0513 Given   metroNIDAZOLE (FLAGYL) tablet 500 mg 500 mg      . aspirin EC  81 mg Oral Daily  . chlorhexidine  60 mL Topical Once  . chlorhexidine  60 mL Topical Once  . enoxaparin (LOVENOX) injection  40 mg Subcutaneous Q24H  . fenofibrate  160 mg Oral Daily  . insulin aspart  0-20 Units Subcutaneous Q4H  . insulin aspart  5 Units Subcutaneous TID WC  . insulin glargine  30 Units Subcutaneous Q24H  . insulin glargine  5 Units Subcutaneous Once  . insulin starter kit- pen needles  1 kit Other Once  . metroNIDAZOLE  500  mg Oral Q8H  . senna-docusate  1 tablet Oral QHS  . sodium chloride flush  3 mL Intravenous Q12H    Objective: Vital signs in last 24 hours: Temp:  [98.4 F (36.9 C)-99.6 F (37.6 C)] 98.4 F (36.9 C) (06/15 0926) Pulse Rate:  [84-85] 85 (06/15 0926) Resp:  [18-20] 20 (06/15 0926) BP: (137-158)/(78-83) 158/83 (06/15 0926) SpO2:  [93 %-95 %] 95 % (06/15 0926)  Constitutional: He is obese, awake, interactive HENT: anicteric Mouth/Throat: Oropharynx is clear and dry. No oropharyngeal exudate.  Cardiovascular: Tachycardia, regular Pulmonary/Chest: Effort normal and breath sounds normal.  Abdominal: Soft. Obese Bowel sounds are normal. He exhibits no distension. There is no tenderness.  Lymphadenopathy: He has no cervical adenopathy.  Neurological: He is alert and oriented to person, place, and time.  Skin: bil leg wrapped Psychiatric: interactive  Lab Results  Recent Labs  09/26/16 0350 09/27/16 0334 09/28/16 0522  WBC 18.4* 15.9* 16.5*  HGB 9.1* 9.5* 9.5*   HCT 26.6* 28.5* 27.7*  NA 134*  --  136  K 3.6  --  2.9*  CL 101  --  102  CO2 26  --  28  BUN 16  --  11  CREATININE 0.97  --  0.94   Lab Results  Component Value Date   ESRSEDRATE 107 (H) 09/25/2016   Lab Results  Component Value Date   CRP 35.6 (H) 09/25/2016    Microbiology: Results for orders placed or performed during the hospital encounter of 09/23/16  Blood culture (routine x 2)     Status: Abnormal   Collection Time: 09/23/16  7:44 PM  Result Value Ref Range Status   Specimen Description BLOOD LEFT ANTECUBITAL  Final   Special Requests   Final    BOTTLES DRAWN AEROBIC AND ANAEROBIC Blood Culture adequate volume   Culture  Setup Time   Final    GRAM POSITIVE COCCI IN BOTH AEROBIC AND ANAEROBIC BOTTLES CRITICAL RESULT CALLED TO, READ BACK BY AND VERIFIED WITH: JASON ROBBINS AT 0814 ON 09/24/2016 JJB    Culture (A)  Final    GROUP B STREP(S.AGALACTIAE)ISOLATED SUSCEPTIBILITIES PERFORMED ON PREVIOUS CULTURE WITHIN THE LAST 5 DAYS.    Report Status 09/27/2016 FINAL  Final  Blood culture (routine x 2)     Status: Abnormal   Collection Time: 09/23/16  7:44 PM  Result Value Ref Range Status   Specimen Description BLOOD RIGHT ANTECUBITAL  Final   Special Requests   Final    BOTTLES DRAWN AEROBIC AND ANAEROBIC Blood Culture adequate volume   Culture  Setup Time   Final    GRAM POSITIVE COCCI IN BOTH AEROBIC AND ANAEROBIC BOTTLES CRITICAL RESULT CALLED TO, READ BACK BY AND VERIFIED WITH: JASON ROBBINS AT 4818 ON 09/24/2016 JJB Performed at [facility], [address], [place], [name] [address]    Culture GROUP B STREP(S.AGALACTIAE)ISOLATED (A)  Final   Report Status 09/27/2016 FINAL  Final   Organism ID, Bacteria GROUP B STREP(S.AGALACTIAE)ISOLATED  Final      Susceptibility   Group b strep(s.agalactiae)isolated - MIC*    CLINDAMYCIN <=0.25 SENSITIVE Sensitive     AMPICILLIN <=0.25 SENSITIVE Sensitive     ERYTHROMYCIN <=0.12 SENSITIVE Sensitive      VANCOMYCIN 0.5 SENSITIVE Sensitive     CEFTRIAXONE <=0.12 SENSITIVE Sensitive     LEVOFLOXACIN 1 SENSITIVE Sensitive     * GROUP B STREP(S.AGALACTIAE)ISOLATED  Blood Culture ID Panel (Reflexed)     Status: Abnormal   Collection  Time: 09/23/16  7:44 PM  Result Value Ref Range Status   Enterococcus species NOT DETECTED NOT DETECTED Final   Listeria monocytogenes NOT DETECTED NOT DETECTED Final   Staphylococcus species NOT DETECTED NOT DETECTED Final   Staphylococcus aureus NOT DETECTED NOT DETECTED Final   Streptococcus species DETECTED (A) NOT DETECTED Final    Comment: CRITICAL RESULT CALLED TO, READ BACK BY AND VERIFIED WITH: JASON ROBBINS AT 1608 ON 09/24/2016 JJB    Streptococcus agalactiae DETECTED (A) NOT DETECTED Final    Comment: CRITICAL RESULT CALLED TO, READ BACK BY AND VERIFIED WITH: JASON ROBBINS AT 1608 ON 09/24/2016 JJB    Streptococcus pneumoniae NOT DETECTED NOT DETECTED Final   Streptococcus pyogenes NOT DETECTED NOT DETECTED Final   Acinetobacter baumannii NOT DETECTED NOT DETECTED Final   Enterobacteriaceae species NOT DETECTED NOT DETECTED Final   Enterobacter cloacae complex NOT DETECTED NOT DETECTED Final   Escherichia coli NOT DETECTED NOT DETECTED Final   Klebsiella oxytoca NOT DETECTED NOT DETECTED Final   Klebsiella pneumoniae NOT DETECTED NOT DETECTED Final   Proteus species NOT DETECTED NOT DETECTED Final   Serratia marcescens NOT DETECTED NOT DETECTED Final   Haemophilus influenzae NOT DETECTED NOT DETECTED Final   Neisseria meningitidis NOT DETECTED NOT DETECTED Final   Pseudomonas aeruginosa NOT DETECTED NOT DETECTED Final   Candida albicans NOT DETECTED NOT DETECTED Final   Candida glabrata NOT DETECTED NOT DETECTED Final   Candida krusei NOT DETECTED NOT DETECTED Final   Candida parapsilosis NOT DETECTED NOT DETECTED Final   Candida tropicalis NOT DETECTED NOT DETECTED Final  Aerobic/Anaerobic Culture (surgical/deep wound)     Status: None  (Preliminary result)   Collection Time: 09/24/16 12:34 AM  Result Value Ref Range Status   Specimen Description WOUND RIGHT GREAT TOE  Final   Special Requests NONE  Final   Gram Stain   Final    RARE WBC PRESENT, PREDOMINANTLY PMN RARE SQUAMOUS EPITHELIAL CELLS PRESENT ABUNDANT GRAM POSITIVE COCCI IN PAIRS MODERATE GRAM NEGATIVE RODS RARE GRAM POSITIVE RODS    Culture   Final    MODERATE GROUP B STREP(S.AGALACTIAE)ISOLATED TESTING AGAINST S. AGALACTIAE NOT ROUTINELY PERFORMED DUE TO PREDICTABILITY OF AMP/PEN/VAN SUSCEPTIBILITY. FEW STAPHYLOCOCCUS AUREUS HOLDING FOR POSSIBLE ANAEROBE Performed at [facility], [place] [address], [place], [place] [address]    Report Status PENDING  Incomplete   Organism ID, Bacteria STAPHYLOCOCCUS AUREUS  Final      Susceptibility   Staphylococcus aureus - MIC*    CIPROFLOXACIN <=0.5 SENSITIVE Sensitive     ERYTHROMYCIN >=8 RESISTANT Resistant     GENTAMICIN <=0.5 SENSITIVE Sensitive     OXACILLIN <=0.25 SENSITIVE Sensitive     TETRACYCLINE <=1 SENSITIVE Sensitive     VANCOMYCIN <=0.5 SENSITIVE Sensitive     TRIMETH/SULFA <=10 SENSITIVE Sensitive     CLINDAMYCIN RESISTANT Resistant     RIFAMPIN <=0.5 SENSITIVE Sensitive     Inducible Clindamycin POSITIVE Resistant     * FEW STAPHYLOCOCCUS AUREUS  Aerobic/Anaerobic Culture (surgical/deep wound)     Status: None (Preliminary result)   Collection Time: 09/24/16 12:35 AM  Result Value Ref Range Status   Specimen Description WOUND LEFT GREAT TOE  Final   Special Requests NONE  Final   Gram Stain   Final    FEW WBC PRESENT, PREDOMINANTLY PMN ABUNDANT GRAM NEGATIVE RODS IN PAIRS ABUNDANT GRAM NEGATIVE RODS FEW GRAM POSITIVE RODS    Culture   Final    ABUNDANT GROUP B STREP(S.AGALACTIAE)ISOLATED TESTING AGAINST  S. AGALACTIAE NOT ROUTINELY PERFORMED DUE TO PREDICTABILITY OF AMP/PEN/VAN SUSCEPTIBILITY. HOLDING FOR POSSIBLE ANAEROBE Performed at [facility], [place] [address]  [place], [place] [address]    Report Status PENDING  Incomplete  CULTURE, BLOOD (ROUTINE X 2) w Reflex to ID Panel     Status: None (Preliminary result)   Collection Time: 09/26/16  7:14 PM  Result Value Ref Range Status   Specimen Description BLOOD BLOOD RIGHT HAND  Final   Special Requests   Final    BOTTLES DRAWN AEROBIC AND ANAEROBIC Blood Culture results may not be optimal due to an excessive volume of blood received in culture bottles   Culture NO GROWTH 2 DAYS  Final   Report Status PENDING  Incomplete  CULTURE, BLOOD (ROUTINE X 2) w Reflex to ID Panel     Status: None (Preliminary result)   Collection Time: 09/26/16  7:22 PM  Result Value Ref Range Status   Specimen Description BLOOD [facility]  Final   Special Requests   Final    BOTTLES DRAWN AEROBIC AND ANAEROBIC Blood Culture results may not be optimal due to an excessive volume of blood received in culture bottles   Culture NO GROWTH 2 DAYS  Final   Report Status PENDING  Incomplete  MRSA PCR Screening     Status: None   Collection Time: 09/27/16  5:07 PM  Result Value Ref Range Status   MRSA by PCR NEGATIVE NEGATIVE Final    Comment:        The GeneXpert MRSA Assay (FDA approved for NASAL specimens only), is one component of a comprehensive MRSA colonization surveillance program. It is not intended to diagnose MRSA infection nor to guide or monitor treatment for MRSA infections.     Studies/Results: Dg Foot Complete Left  Result Date: 09/27/2016 CLINICAL DATA:  Fashion suspected of the left foot. History of diabetes. 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 Gas extends along the proximal aspect of the first metatarsal soft tissues as well. There is soft tissue swelling of the second and third toes which is not new. No acute bony  changes are noted. IMPRESSION: Soft tissue gas compatible with cellulitis at the amputation site and in the proximal mid third up adjacent to the first metatarsal. No bony changes to suggest osteomyelitis. Electronically Signed   By: Kylian Loh  [place] M.D.   On: 09/27/2016 08:26    Assessment/Plan: Kyle Banks is a 60 y.o. male with bil great toe infections with gas noted in tissue, and wbc 25 and fever 101 on admit. Sugars were >400, cr elevated (unclear baseline).  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  Per op note on the L the infection extended back to dorsal midfoot and extensive debridement was done.  Cultures are pending but given the extent of infection I think he will need IV abx for several weeks. BXC + GBStrep. Wound cx GB strep and Staph aureus (MSSA) .ESR 107, crp 35, A1c 15.3 6/13- vascular surg consulted, podiatry feels will need further debridement on L foot. 6/15- s/p revasc and repeat surgery  Recommendations PICC ordered Can change to just zosyn to cover GB strep, MSSA and anaerobes. WIll plan on initial 2-4 weeks IV and can transition to orals at fu if improving - considering [facility] resources  for DC Thank you very much for the consult. Will follow with you.  [place], Yanisa Goodgame P   09/28/2016, 2:00 PM

## 2016-09-28 NOTE — Op Note (Signed)
Operative note   Surgeon:Levorn Oleski Lawyer: None    Preop diagnosis: 1. Left foot infection 2. Left ankle infection   Postop diagnosis: Same   Procedure:1.  Incision and drainage left foot infection with debridment to bone  2.  Incision and drainage medial ankle infection 3.Incision and drainage lateral ankle infection 4. Placement of wound VAC left foot    [id number]    Anesthesia:general    Hemostasis:  none    Specimen:culture wound    Complications:none    Operative indications:Kyle Banks is an 61 y.o. that presents today for surgical intervention.  The risks/benefits/alternatives/complications have been discussed and consent has been given.    Procedure:  Patient was brought into the OR and placed on the operating table in thesupine position. After anesthesia was obtained theleft lower extremity was prepped and draped in usual sterile fashion.   attention was directed to the distal aspect of the left foot where along the first metatarsal there was noted be a large necrotic skin flap from the previous great toe amputation. The necrotic skin flap was sharply removed to the dorsal foot to the level of about the dorsal midfoot region. There was noted purulent drainage extending to the dorsal and anterior aspect of the ankle region. This was probed with a hemostat. A secondary incision was made on the antero medial aspect of the ankle where noted infection was found probing to the medial aspect of the ankles in the subcutaneous tissue. This wound was then flushed with copious amounts or irrigation. Further debridement was performed and there was noted to be marketed infection to the dorsal aspect of the lateral foot where after further probing this seemed to extend up to the lateral aspect of the ankle. A third incision was made overlying the lateral aspect of the ankle region. This was carried proximal to the ankle joint along the lateral fibular region. Purulent drainage was  noted in this area as well. This was also flushed with copious amounts of irrigation.  The dorsal aspect of the first metatarsal was then excisionally debrided down to bone. The debridement was carried out from the base of the first metatarsal to the metatarsal head and into the first intermetatarsal space. The debridement site measured approximately 9 x 5 cm down to bone. The anterolateral and anteromedial ankle incisions were loosely closed with a 3-0 nylon. All areas were packed with iodoform packing. A wound VAC was placed overlying the dorsal first metatarsal ulceration site.    Patient tolerated the procedure and anesthesia well.  Was transported from the OR to the PACU with all vital signs stable and vascular status intact. To be discharged per routine protocol.  Will follow up in approximately 1 week in the outpatient clinic.

## 2016-09-28 NOTE — Progress Notes (Signed)
Clinical Education officer, museum (CSW) made Kyle Banks liaison aware of IV Abx orders. CSW will continue to follow and assist as needed.   [employer], LCSW [phone]

## 2016-09-28 NOTE — Progress Notes (Signed)
Pt back from surgery. Alert and oriented x4.  Daughter at bedside.

## 2016-09-28 NOTE — Anesthesia Procedure Notes (Signed)
Procedure Name: LMA Insertion Date/Time: 09/28/2016 3:46 PM Performed by: Jonna Clark Pre-anesthesia Checklist: Patient identified, Patient being monitored, Timeout performed, Emergency Drugs available and Suction available Patient Re-evaluated:Patient Re-evaluated prior to inductionOxygen Delivery Method: Circle system utilized Preoxygenation: Pre-oxygenation with 100% oxygen Intubation Type: IV induction Ventilation: Mask ventilation without difficulty LMA: LMA inserted LMA Size: 4.0 Tube type: Oral Number of attempts: 1 Placement Confirmation: positive ETCO2 and breath sounds checked- equal and bilateral Tube secured with: Tape Dental Injury: Teeth and Oropharynx as per pre-operative assessment

## 2016-09-28 NOTE — Progress Notes (Addendum)
Patient's potassium level 2.9. MD. Gouru notified. New orders received.   Wynema Birch, RN

## 2016-09-28 NOTE — Progress Notes (Signed)
Peripherally Inserted Central Catheter/Midline Placement  The IV Nurse has discussed with the patient and/or persons authorized to consent for the patient, the purpose of this procedure and the potential benefits and risks involved with this procedure.  The benefits include less needle sticks, lab draws from the catheter, and the patient may be discharged home with the catheter. Risks include, but not limited to, infection, bleeding, blood clot (thrombus formation), and puncture of an artery; nerve damage and irregular heartbeat and possibility to perform a PICC exchange if needed/ordered by physician.  Alternatives to this procedure were also discussed.  Bard Power PICC patient education guide, fact sheet on infection prevention and patient information card has been provided to patient /or left at bedside.    PICC/Midline Placement Documentation  PICC Single Lumen 40/98/11 PICC Left Basilic 50 cm 2 cm (Active)  Indication for Insertion or Continuance of Line Home intravenous therapies (PICC only) 09/28/2016  8:50 PM  Exposed Catheter (cm) 2 cm 09/28/2016  8:50 PM  Site Assessment Clean;Dry;Intact 09/28/2016  8:50 PM  Line Status Blood return noted;Flushed;Saline locked 09/28/2016  8:50 PM  Dressing Type Transparent 09/28/2016  8:50 PM  Dressing Status Clean;Dry;Intact;Antimicrobial disc in place 09/28/2016  8:50 PM  Dressing Intervention New dressing 09/28/2016  8:50 PM  Dressing Change Due 10/05/16 09/28/2016  8:50 PM       Aldona Lento L 09/28/2016, 9:01 PM

## 2016-09-28 NOTE — Brief Op Note (Signed)
Patient underwent incision and drainage of left foot with excisional debridement of dorsal skin flap that was necrotic from the previous amputation of the great toe to the midfoot area. During the procedure there was noted to be marketed purulent drainage extending on the dorsal aspect of the foot to the lateral aspect of the foot extending proximally to the anterolateral and anteromedial aspect of the ankle. The ankle joint itself did not seem to be infected. All these areas were flushed and cleansed and packed. Ace wound VAC was placed to the large defect on the dorsal foot with exposed metatarsal. Patient is under high risk of undergoing further amputation and possible below-the-knee amputation given the extent of the infection. Concern for further necrosis of the skin is high at this time.

## 2016-09-28 NOTE — Anesthesia Preprocedure Evaluation (Addendum)
Anesthesia Evaluation  Patient identified by MRN, date of birth, ID band Patient awake    Reviewed: Allergy & Precautions, NPO status , Patient's Chart, lab work & pertinent test results, reviewed documented beta blocker date and time   History of Anesthesia Complications Negative for: history of anesthetic complications  Airway Mallampati: II  TM Distance: >3 FB     Dental  (+)    Pulmonary neg pulmonary ROS, Sleep apnea: suggestive hx but not diagnosed. , neg COPD,    breath sounds clear to auscultation- rhonchi (-) wheezing      Cardiovascular Exercise Tolerance: Good (-) hypertension(-) CAD and (-) Past MI  Rhythm:Regular Rate:Normal - Systolic murmurs and - Diastolic murmurs    Neuro/Psych negative neurological ROS  negative psych ROS   GI/Hepatic negative GI ROS, Neg liver ROS,   Endo/Other  diabetes, Insulin Dependent  Renal/GU negative Renal ROS     Musculoskeletal negative musculoskeletal ROS (+)   Abdominal (+) + obese,   Peds  Hematology negative hematology ROS (+)   Anesthesia Other Findings   Reproductive/Obstetrics                            Anesthesia Physical Anesthesia Plan  ASA: II  Anesthesia Plan: General   Post-op Pain Management:    Induction: Intravenous  PONV Risk Score and Plan: 1 and Ondansetron, Dexamethasone, Propofol and Midazolam  Airway Management Planned: LMA  Additional Equipment:   Intra-op Plan:   Post-operative Plan:   Informed Consent: I have reviewed the patients History and Physical, chart, labs and discussed the procedure including the risks, benefits and alternatives for the proposed anesthesia with the patient or authorized representative who has indicated his/her understanding and acceptance.     Plan Discussed with: CRNA  Anesthesia Plan Comments:        Anesthesia Quick Evaluation

## 2016-09-28 NOTE — OR Nursing (Signed)
Right foot dressing changed by MD in the OR.

## 2016-09-28 NOTE — Transfer of Care (Signed)
Immediate Anesthesia Transfer of Care Note  Patient: Kyle Banks  Procedure(s) Performed: Procedure(s): IRRIGATION AND DEBRIDEMENT FOOT (Left)  Patient Location: PACU  Anesthesia Type:General  Level of Consciousness: drowsy and patient cooperative  Airway & Oxygen Therapy: Patient Spontanous Breathing and Patient connected to face mask oxygen  Post-op Assessment: Report given to RN and Post -op Vital signs reviewed and stable  Post vital signs: Reviewed and stable  Last Vitals:  Vitals:   09/28/16 0926 09/28/16 1656  BP: (!) 158/83 (!) 144/81  Pulse: 85 87  Resp: 20 (!) 25  Temp: 36.9 C 37.2 C    Last Pain:  Vitals:   09/28/16 0926  TempSrc: Oral  PainSc:       Patients Stated Pain Goal: 0 (73/42/87 6811)  Complications: No apparent anesthesia complications

## 2016-09-28 NOTE — Progress Notes (Signed)
PHARMACY CONSULT NOTE FOR:  OUTPATIENT  PARENTERAL ANTIBIOTIC THERAPY (OPAT)  Indication: Wound and blood Regimen: Zosyn 3.375 g IV q8h End date: 10/22/16 (July 9)  IV antibiotic discharge orders are pended. To discharging provider:  please sign these orders via discharge navigator,  Select New Orders & click on the button choice - Manage This Unsigned Work.     Thank you for allowing pharmacy to be a part of this patient's care.  Rocky Morel 09/28/2016, 2:40 PM

## 2016-09-28 NOTE — Progress Notes (Signed)
Per RN in progression rounds this morning patient is going back to the OR today for an I&D. Per RN patient will not be stable for D/C this weekend. Plan is for patient to D/C to [facility] pending [employer] authorization. Per Baron Sane is asking for IV ABX that patient will be discharged on. IV ABX that patient will discharge on has not been determined yet. Broadus John sent [employer] updated clinicals to [employer]. CSW will continue to follow and assist as needed.   [employer], LCSW [phone]

## 2016-09-29 ENCOUNTER — Encounter: Payer: Self-pay | Admitting: Podiatry

## 2016-09-29 LAB — BASIC METABOLIC PANEL
Anion gap: 7 (ref 5–15)
BUN: 12 mg/dL (ref 6–20)
CO2: 26 mmol/L (ref 22–32)
Calcium: 7.7 mg/dL — ABNORMAL LOW (ref 8.9–10.3)
Chloride: 103 mmol/L (ref 101–111)
Creatinine, Ser: 0.95 mg/dL (ref 0.61–1.24)
GFR calc Af Amer: >60 mL/min (ref >60)
GFR calc non Af Amer: >60 mL/min (ref >60)
Glucose, Bld: 143 mg/dL — ABNORMAL HIGH (ref 65–99)
Potassium: 3.7 mmol/L (ref 3.5–5.1)
Sodium: 136 mmol/L (ref 135–145)

## 2016-09-29 LAB — GLUCOSE, CAPILLARY
GLUCOSE-CAPILLARY: 106 mg/dL — AB (ref 65–99)
GLUCOSE-CAPILLARY: 139 mg/dL — AB (ref 65–99)
GLUCOSE-CAPILLARY: 148 mg/dL — AB (ref 65–99)
Glucose-Capillary: 141 mg/dL — ABNORMAL HIGH (ref 65–99)
Glucose-Capillary: 107 mg/dL — ABNORMAL HIGH (ref 65–99)
Glucose-Capillary: 96 mg/dL (ref 65–99)
Glucose-Capillary: 115 mg/dL — ABNORMAL HIGH (ref 65–99)

## 2016-09-29 LAB — CBC
HCT: 26.5 % — ABNORMAL LOW (ref 40.0–52.0)
Hemoglobin: 9 g/dL — ABNORMAL LOW (ref 13.0–18.0)
MCH: 28.3 pg (ref 26.0–34.0)
MCHC: 33.9 g/dL (ref 32.0–36.0)
MCV: 83.5 fL (ref 80.0–100.0)
Platelets: 304 10*3/uL (ref 150–440)
RBC: 3.18 MIL/uL — ABNORMAL LOW (ref 4.40–5.90)
RDW: 14.5 % (ref 11.5–14.5)
WBC: 16.5 10*3/uL — ABNORMAL HIGH (ref 3.8–10.6)

## 2016-09-29 MED ORDER — ALUM & MAG HYDROXIDE-SIMETH 200-200-20 MG/5ML PO SUSP
15.0000 mL | ORAL | Status: DC | PRN
  Administered 2016-09-29 – 2016-10-02 (×5): 15 mL via ORAL
  Filled 2016-09-29 (×5): qty 30

## 2016-09-29 NOTE — Progress Notes (Signed)
Patient a&o, VSS. Wound vac and dressing in place on left foot. Dressing in place on right foot. Dressings intact. Pain meds given as needed. No signs or complaints of distress. Continue to monitor.

## 2016-09-29 NOTE — Progress Notes (Signed)
PT Cancellation Note  Patient Details Name: Kyle Banks MRN: [id number] DOB: Feb 24, 1956   Cancelled Treatment:    Reason Eval/Treat Not Completed: Other (comment).  Treatment deferred and will see tomorrow.   Ramond Dial 09/29/2016, 3:25 PM   Mee Hives, PT MS Acute Rehab Dept. Number: [place] [facility] [place]

## 2016-09-29 NOTE — Progress Notes (Signed)
Pt alert and oriented. Medicated for pain with good results at the beginning of the shift. Scant amount of drainage to surgical dressing. Minimal drainage in wound vac. PICC infusing without difficulty. Pt able to sleep through the night.

## 2016-09-29 NOTE — Progress Notes (Signed)
Kyle Banks at [place] NAME: Kyle Banks    MR#:  [id number]  DATE OF BIRTH:  04-19-1955  CHIEF COMPLAINT:   Chief Complaint  Patient presents with  . Dizziness  . Wound Infection   Wound dressing change by podiatry in the morning. Complains of increased pain. No shortness of breath. Good appetite.  REVIEW OF SYSTEMS:   ROS CONSTITUTIONAL: No fever,  EYES: No blurred or double vision.  EARS, NOSE, AND THROAT: No tinnitus or ear pain.  RESPIRATORY: No cough, shortness of breath, wheezing or hemoptysis.  CARDIOVASCULAR: No chest pain, orthopnea, edema.  GASTROINTESTINAL: No nausea, vomiting, diarrhea or abdominal pain.  GENITOURINARY: No dysuria, hematuria.  ENDOCRINE: No polyuria, nocturia,  HEMATOLOGY: No anemia, easy bruising or bleeding SKIN: No rash or lesion. MUSCULOSKELETAL:Status post amputation Pain in both feet PSYCHIATRY: No anxiety or depression.   DRUG ALLERGIES:  No Known Allergies  VITALS:  Blood pressure 138/84, pulse (!) 106, temperature 99 F (37.2 C), temperature source Oral, resp. rate 18, height 5\' 11"  (1.803 m), weight 98.9 kg (218 lb), SpO2 95 %.  PHYSICAL EXAMINATION:  GENERAL:  61 y.o.-year-old patient lying in the bed with no acute distress.  EYES: Pupils equal, round, reactive to light and  accommodation. No scleral icterus. Extraocular muscles intact.  HEENT: Head atraumatic, normocephalic. Oropharynx and nasopharynx clear.  NECK:  Supple, no jugular venous distention. No thyroid enlargement, no tenderness.  LUNGS: Normal breath sounds bilaterally, no wheezing, rales,rhonchi or crepitation. No use of accessory muscles of respiration.  CARDIOVASCULAR: S1, S2 normal. No murmurs, rubs, or gallops.  ABDOMEN: Soft, nontender, nondistended. Bowel sounds present. No organomegaly or mass.  EXTREMITIES;Dressing present to both feet. NEUROLOGIC: Cranial nerves II through XII are intact. Muscle strength  5/5 in all extremities. Sensation intact. Gait not checked.  PSYCHIATRIC: The patient is alert and oriented x 3.  SKIN: Dressing present to both feet. Wound VAC in place  LABORATORY PANEL:   CBC  Recent Labs Lab 09/29/16 0404  WBC 16.5*  HGB 9.0*  HCT 26.5*  PLT 304   ------------------------------------------------------------------------------------------------------------------  Chemistries   Recent Labs Lab 09/24/16 0300 09/24/16 0307  09/29/16 0404  NA  --  128*  < > 136  K  --  4.2  < > 3.7  CL  --  92*  < > 103  CO2  --  23  < > 26  GLUCOSE  --  460*  < > 143*  BUN  --  32*  < > 12  CREATININE  --  1.47*  < > 0.95  CALCIUM  --  8.1*  < > 7.7*  MG 2.0  --   --   --   AST  --  29  --   --   ALT  --  40  --   --   ALKPHOS  --  167*  --   --   BILITOT  --  1.1  --   --   < > = values in this interval not displayed. ------------------------------------------------------------------------------------------------------------------  Cardiac Enzymes No results for input(s): TROPONINI in the last 168 hours. ------------------------------------------------------------------------------------------------------------------  RADIOLOGY:  No results found.  EKG:   Orders placed or performed during the hospital encounter of 09/23/16  . ED EKG  . ED EKG  . 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 Treated with broad-spectrum antibiotics initially. Now changed to Zosyn to cover for group B streptococcus, MSSA and anaerobes. Appreciate infectious disease input. Left foot debridement done on 09/28/2016. Wound VAC placed.  Podiatry following. Will need for weeks of IV antibiotics. PICC line placed  #. Diabetes mellitus type 2 uncontrolled HbA1c at 15  Patient has no PCP. Blood sugars were ranging at 400s.  On Lantus 30  units daily, NovoLog 5 units 3 times a day  blood sugar better controlled  #. Pseudo-Hyponatremia due to hyperglycemia resolved  # Metabolic encephalopathy secondary to sepsis.resolved  # Hypertriglyceridemia Likely from his uncontrolled diabetes. Started on [employer] this admission.  All the records are reviewed and case discussed with Care Management/Social Worker Management plans discussed with the patient, family and they are in agreement.  CODE STATUS: FULL CODE  TOTAL TIME TAKING CARE OF THIS PATIENT: 25 minutes.   Likely discharge on Monday  Hillary Bow R M.D on 09/29/2016 at 10:39 AM  Between 7am to 6pm - Pager - [phone]  After 6pm go to [email or web address] - password [email or web address] [facility]  Office  [phone]  CC: Primary care physician; Patient, No Pcp Per  Note: This dictation was prepared with Dragon dictation along with smaller phrase technology. Any transcriptional errors that result from this process are unintentional.

## 2016-09-29 NOTE — Plan of Care (Signed)
Problem: Education: Goal: Knowledge of [facility] Education information/materials will improve Outcome: Completed/Met Date Met: 09/29/16 Patient aware of general education    Problem: Safety: Goal: Ability to remain free from injury will improve Outcome: Completed/Met Date Met: 09/29/16 Patient calls out for assist.

## 2016-09-29 NOTE — Progress Notes (Signed)
Patient Demographics  Kyle Banks, is a 61 y.o. male   MRN: [id number]   DOB - 1955-07-01  Admit Date - 09/23/2016    Outpatient Primary MD for the patient is Patient, No Pcp Per  Consult requested in the Hospital by Hillary Bow, MD, On 09/29/2016     With History of -  History reviewed. No pertinent past medical history.    Past Surgical History:  Procedure Laterality Date  . AMPUTATION TOE Bilateral 09/23/2016   Procedure: AMPUTATION TOE;  Surgeon: Samara Deist, DPM;  Location: [facility] ORS;  Service: Podiatry;  Laterality: Bilateral;  . INCISION AND DRAINAGE Left 09/23/2016   Procedure: INCISION AND DRAINAGE;  Surgeon: Samara Deist, DPM;  Location: [facility] ORS;  Service: Podiatry;  Laterality: Left;  . IRRIGATION AND DEBRIDEMENT FOOT Left 09/28/2016   Procedure: IRRIGATION AND DEBRIDEMENT FOOT;  Surgeon: Samara Deist, DPM;  Location: [facility] ORS;  Service: Podiatry;  Laterality: Left;  . JOINT REPLACEMENT     bilateral knee replacements miller and hower pt unsure of dates  . PERIPHERAL VASCULAR INTERVENTION Left 09/26/2016   Procedure: Peripheral Vascular Intervention;  Surgeon: Katha Cabal, MD;  Location: [facility];  Service: Cardiovascular;  Laterality: Left;    in for   Chief Complaint  Patient presents with  . Dizziness  . Wound Infection     HPI  Kyle Banks  is a 61 y.o. male, Admitted to the hospital approximately a week ago with severe infection to both great toe areas left foot was worse. Left foot had gas gangrene. Dr. Vickki Muff had to do bilateral great toe amputations try to bring the infection under control and stabilize the patient. He went back in on the patient yesterday on the left foot and cleaned up the continued tissue damage to the left first metatarsal region. He  had remove infected necrotic tissue and skin from the first metatarsal leaving exposure to the area. Also noted were edge areas proximally along the medial lateral ankle these were opened up and irrigated and packed in a couple different areas with iodoform gauze. Also of concern is some skin slough and deep indentation across the dorsum of the foot.    Social History Social History  Substance Use Topics  . Smoking status: Never Smoker  . Smokeless tobacco: Never Used  . Alcohol use No    Family History History reviewed. No pertinent family history.   Prior to Admission medications   Medication Sig Start Date End Date Taking? Authorizing Provider  cyanocobalamin 500 MCG tablet Take 500 mcg by mouth daily.   Yes [provider]  Garlic 10 MG CAPS Take 1 tablet by mouth daily.   Yes [provider]  Omega-3 Fatty Acids (FISH OIL) 1000 MG CAPS Take 1 capsule by mouth daily.   Yes [provider]    Anti-infectives    Start     Dose/Rate Route Frequency Ordered Stop   09/28/16 1500  piperacillin-tazobactam (ZOSYN) IVPB 3.375 g     3.375 g 12.5 mL/hr over 240 Minutes Intravenous Every 8 hours 09/28/16 1415     09/28/16 1400  ceftAZIDime (FORTAZ) 2 gram/50 mL D5W IVPB - DUPLEX  Status:  Discontinued     2 g 100 mL/hr over 30 Minutes Intravenous Every 8 hours 09/28/16 0813 09/28/16 1408   09/28/16 0600  cefTAZidime (FORTAZ) 2 g in dextrose 5 % 50 mL IVPB  Status:  Discontinued     2 g 100 mL/hr over 30 Minutes Intravenous Every 8 hours 09/27/16 1621 09/28/16 0813   09/26/16 2200  ceftAZIDime (FORTAZ) 2 gram/50 mL D5W IVPB - DUPLEX  Status:  Discontinued     2 g 100 mL/hr over 30 Minutes Intravenous Every 8 hours 09/26/16 0802 09/27/16 1620   09/25/16 2200  ceftAZIDime (FORTAZ) 2 gram/50 mL D5W IVPB - DUPLEX  Status:  Discontinued     2 g 100 mL/hr over 30 Minutes Intravenous Every 8 hours 09/25/16 0828 09/25/16 1950   09/25/16 2200  ceftAZIDime (FORTAZ) 2  gram/50 mL D5W IVPB - DUPLEX  Status:  Discontinued     2 g 100 mL/hr over 30 Minutes Intravenous Every 8 hours 09/25/16 1951 09/25/16 1951   09/25/16 2200  cefTAZidime (FORTAZ) 2 g in dextrose 5 % 50 mL IVPB     2 g 100 mL/hr over 30 Minutes Intravenous Every 8 hours 09/25/16 1952 09/26/16 1334   09/25/16 1600  vancomycin (VANCOCIN) 1,250 mg in sodium chloride 0.9 % 250 mL IVPB  Status:  Discontinued     1,250 mg 166.7 mL/hr over 90 Minutes Intravenous Every 12 hours 09/25/16 1553 09/25/16 1555   09/25/16 1600  vancomycin (VANCOCIN) 1,500 mg in sodium chloride 0.9 % 500 mL IVPB  Status:  Discontinued     1,500 mg 250 mL/hr over 120 Minutes Intravenous Every 12 hours 09/25/16 1557 09/28/16 1408   09/24/16 1600  cefTAZidime (FORTAZ) 2 g in dextrose 5 % 50 mL IVPB  Status:  Discontinued     2 g 100 mL/hr over 30 Minutes Intravenous Every 8 hours 09/24/16 1530 09/25/16 0828   09/24/16 1530  metroNIDAZOLE (FLAGYL) tablet 500 mg     500 mg Oral Every 8 hours 09/24/16 1528     09/24/16 0500  piperacillin-tazobactam (ZOSYN) IVPB 3.375 g  Status:  Discontinued     3.375 g 12.5 mL/hr over 240 Minutes Intravenous Every 8 hours 09/23/16 2133 09/24/16 1528   09/24/16 0400  vancomycin (VANCOCIN) IVPB 1000 mg/200 mL premix  Status:  Discontinued     1,000 mg 200 mL/hr over 60 Minutes Intravenous Every 12 hours 09/23/16 2133 09/25/16 1553   09/24/16 0230  piperacillin-tazobactam (ZOSYN) IVPB 3.375 g  Status:  Discontinued     3.375 g 100 mL/hr over 30 Minutes Intravenous  Once 09/24/16 0223 09/24/16 0226   09/24/16 0230  vancomycin (VANCOCIN) IVPB 1000 mg/200 mL premix  Status:  Discontinued     1,000 mg 200 mL/hr over 60 Minutes Intravenous  Once 09/24/16 0223 09/24/16 0227   09/23/16 2130  piperacillin-tazobactam (ZOSYN) IVPB 3.375 g     3.375 g 100 mL/hr over 30 Minutes Intravenous  Once 09/23/16 2119 09/23/16 2203   09/23/16 2130  vancomycin (VANCOCIN) IVPB 1000 mg/200 mL premix     1,000  mg 200 mL/hr over 60 Minutes Intravenous  Once 09/23/16 2119 09/23/16 2247      Scheduled Meds: . aspirin EC  81 mg Oral Daily  . chlorhexidine  60 mL Topical Once  . enoxaparin (LOVENOX) injection  40 mg Subcutaneous Q24H  . fenofibrate  160 mg Oral Daily  . insulin aspart  0-20 Units Subcutaneous Q4H  . insulin aspart  5  Units Subcutaneous TID WC  . insulin glargine  30 Units Subcutaneous Q24H  . insulin starter kit- pen needles  1 kit Other Once  . metroNIDAZOLE  500 mg Oral Q8H  . senna-docusate  1 tablet Oral QHS  . sodium chloride flush  10-40 mL Intracatheter Q12H  . sodium chloride flush  3 mL Intravenous Q12H   Continuous Infusions: . sodium chloride 100 mL/hr at 09/28/16 2114  . piperacillin-tazobactam (ZOSYN)  IV 3.375 g (09/29/16 0504)  No Known Allergies  Physical Exam: Patient's alert and well-oriented states he is having some pain in the foot especially after a thick dressing and packing out.  Vitals  Blood pressure 138/84, pulse (!) 106, temperature 99 F (37.2 C), temperature source Oral, resp. rate 18, height 5' 11"  (1.803 m), weight 98.9 kg (218 lb), SpO2 95 %.  Lower extremity exam: Packing removed today and dressing change. Wound VAC is in place and functional to the first metatarsal region. This is on the left foot. The incision margins proximally at the mediolateral ankle appear to be stable there is drainage only iodoform gauze which appears to be strictly bloody no purulence draining from the regions incisions. To be fairly stable. Still some depigmentation and concern for eventual skin slough to the dorsum of the foot. The wound VAC is in place intact and functional. Some swelling as compared yesterday looks a little bit better in the foot and ankle. He does states that it is painful never had a change the packing.  CBC  Recent Labs Lab 09/25/16 0405 09/26/16 0350 09/27/16 0334 09/28/16 0522 09/29/16 0404  WBC 19.7* 18.4* 15.9* 16.5* 16.5*  HGB  9.2* 9.1* 9.5* 9.5* 9.0*  HCT 27.5* 26.6* 28.5* 27.7* 26.5*  PLT 198 223 246 278 304  MCV 85.3 83.7 83.6 82.9 83.5  MCH 28.6 28.6 27.9 28.3 28.3  MCHC 33.5 34.2 33.3 34.2 33.9  RDW 14.1 14.3 14.4 14.4 14.5  LYMPHSABS  --   --   --  0.5*  --   MONOABS  --   --   --  2.5*  --   EOSABS  --   --   --  0.2  --   BASOSABS  --   --   --  0.0  --    ------------------------------------------------------------------------------------------------------------------  Chemistries   Recent Labs Lab 09/24/16 0300 09/24/16 0307 09/25/16 0405 09/26/16 0350 09/28/16 0522 09/28/16 1431 09/29/16 0404  NA  --  128* 132* 134* 136  --  136  K  --  4.2 3.5 3.6 2.9* 3.7 3.7  CL  --  92* 98* 101 102  --  103  CO2  --  23 27 26 28   --  26  GLUCOSE  --  460* 202* 151* 120*  --  143*  BUN  --  32* 20 16 11   --  12  CREATININE  --  1.47* 1.06 0.97 0.94  --  0.95  CALCIUM  --  8.1* 8.0* 8.0* 7.7*  --  7.7*  MG 2.0  --   --   --   --   --   --   AST  --  29  --   --   --   --   --   ALT  --  40  --   --   --   --   --   ALKPHOS  --  167*  --   --   --   --   --  BILITOT  --  1.1  --   --   --   --   --    -----------------------------------------------------------------------------  Assessment & PlanActive Problems:   Sepsis due to cellulitis ([place])   overall, I think the patient is gradually stabilizing of the left foot. Foot and ankle overall is little more stable than yesterday but still he is at high risk for lower extremity foot loss due to the severity of his infection. He did have vascular reconstruction this week 3 days ago. Plan: I will leave the wound VAC in place today to change the packing. I will take a look at the right foot tomorrow also.    Family Communication: Plan discussed with patient   Perry Mount M.D on 09/29/2016 at 8:42 AM  Thank you for the consult, we will follow the patient with you in the Hospital.

## 2016-09-30 LAB — GLUCOSE, CAPILLARY
GLUCOSE-CAPILLARY: 107 mg/dL — AB (ref 65–99)
GLUCOSE-CAPILLARY: 132 mg/dL — AB (ref 65–99)
GLUCOSE-CAPILLARY: 146 mg/dL — AB (ref 65–99)
GLUCOSE-CAPILLARY: 88 mg/dL (ref 65–99)
Glucose-Capillary: 118 mg/dL — ABNORMAL HIGH (ref 65–99)

## 2016-09-30 LAB — CREATININE, SERUM: CREATININE: 0.83 mg/dL (ref 0.61–1.24)

## 2016-09-30 MED ORDER — SENNOSIDES-DOCUSATE SODIUM 8.6-50 MG PO TABS
2.0000 | ORAL_TABLET | Freq: Two times a day (BID) | ORAL | Status: DC
  Administered 2016-09-30 – 2016-10-10 (×17): 2 via ORAL
  Filled 2016-09-30 (×19): qty 2

## 2016-09-30 NOTE — Progress Notes (Signed)
Physical Therapy Treatment Patient Details Name: Kyle Banks MRN: [id number] DOB: 02/07/1956 Today's Date: 09/30/2016    History of Present Illness Kyle Banks is a 61 y.o. male with no known medical history presents to the emergency department for evaluation of foot wounds. Patient was in a usual state of health until 3 days ago when he describes the sudden onset of redness, pain, swelling and discharge from his feet bilaterally. He states prior to this infection his feet appeared normal. Patient is unaware of any medical problems such as diabetes and states that he has had annual physicals and blood work at various urgent care centers. Patient denies fevers/chills, weakness, dizziness, chest pain, shortness of breath, N/V/C/D, abdominal pain, dysuria/frequency, changes in mental status. Otherwise, there has been no change in status. Patient has been taking medication as prescribed and there has been no recent change in medication or diet.  No recent antibiotics.  There has been no recent illness, hospitalizations, travel or sick contacts. 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 Attempted to peform PT evaluation on 09/24/16 however pt with myoclonic jerks, dizziness, and facial droop when attempting ambulation. Head CT negative for acute infarct. Pt is now s/p I&D on 6/15. Received continue upon transfer order. Pt is now NWB on L LE.    PT Comments    Pt is making improved progress towards goals with ability to transfer from bed to recliner this date. Still needs +2 assist for mobility and pt unable to maintain correct WBing status on L foot. Unsteady with OOB mobility, needs cues for sequencing with RW. Good endurance noted with there-ex. Will continue to progress.  Follow Up Recommendations  SNF     Equipment Recommendations  Rolling walker with 5" wheels    Recommendations for Other Services        Precautions / Restrictions Precautions Precautions: Fall Required Braces or Orthoses:  (B post op shoes ) Restrictions Weight Bearing Restrictions: Yes LLE Weight Bearing: Non weight bearing    Mobility  Bed Mobility Overal bed mobility: Needs Assistance Bed Mobility: Supine to Sit     Supine to sit: Min guard     General bed mobility comments: needs slight assist for sequencing and sliding B LEs off bed. Once seated, able to maintain seated balance with safe technique.  Transfers Overall transfer level: Needs assistance Equipment used: Rolling walker (2 wheeled) Transfers: Sit to/from Stand Sit to Stand: Mod assist;+2 physical assistance;From elevated surface         General transfer comment: 2 attempts for transfer from EOB. Pt requires bed elevated due to weakness. Heavy cues for L LE WBing restrictions. Heavy leaning noted towards R side. Once standing, pt able to stand with ability to maintain correct WBing. Post op shoes donned prior to mobility.  Ambulation/Gait Ambulation/Gait assistance: Mod assist;+2 physical assistance Ambulation Distance (Feet): 2 Feet Assistive device: Rolling walker (2 wheeled) Gait Pattern/deviations: Step-to pattern     General Gait Details: Attempted to take small pivots on R foot towards chair, however pt has difficulty with maintaining WBing on L foot, placing weight through L foot. Unsafe to further progress mobility at this time. +2 for assistance, however no LOB noted.   Stairs            Wheelchair Mobility    Modified Rankin (Stroke Patients Only)       Balance Overall balance assessment: Needs assistance Sitting-balance support: No upper extremity supported  Sitting balance-Leahy Scale: Good     Standing balance support: Bilateral upper extremity supported Standing balance-Leahy Scale: Fair                              Cognition Arousal/Alertness: Awake/alert Behavior During Therapy: WFL for tasks  assessed/performed Overall Cognitive Status: Within Functional Limits for tasks assessed                                        Exercises Other Exercises Other Exercises: Supine there-x performed including B LE quad sets, SLRs, and SAQ. All ther-ex performed x 10 reps with supervision. Safe technique performed with slight pain noted on L LE.    General Comments        Pertinent Vitals/Pain Pain Assessment: Faces Faces Pain Scale: Hurts even more Pain Location: L foot Pain Descriptors / Indicators: Operative site guarding Pain Intervention(s): Limited activity within patient's tolerance;Patient requesting pain meds-RN notified;RN gave pain meds during session;Repositioned    Home Living                      Prior Function            PT Goals (current goals can now be found in the care plan section) Acute Rehab PT Goals Patient Stated Goal: Return to prior function at home PT Goal Formulation: With patient Time For Goal Achievement: 10/09/16 Potential to Achieve Goals: Good Progress towards PT goals: Progressing toward goals    Frequency    7X/week      PT Plan Current plan remains appropriate    Co-evaluation              AM-PAC PT "6 Clicks" Daily Activity  Outcome Measure  Difficulty turning over in bed (including adjusting bedclothes, sheets and blankets)?: Total Difficulty moving from lying on back to sitting on the side of the bed? : Total Difficulty sitting down on and standing up from a chair with arms (e.g., wheelchair, bedside commode, etc,.)?: Total Help needed moving to and from a bed to chair (including a wheelchair)?: A Lot Help needed walking in hospital room?: A Lot Help needed climbing 3-5 steps with a railing? : A Lot 6 Click Score: 9    End of Session Equipment Utilized During Treatment: Gait belt Activity Tolerance: Patient limited by pain Patient left: in chair;with nursing/sitter in room (no chair alarm  available; RN notified) Nurse Communication: Mobility status;Weight bearing status PT Visit Diagnosis: Unsteadiness on feet (R26.81);Muscle weakness (generalized) (M62.81)     Time: 1610-9604 PT Time Calculation (min) (ACUTE ONLY): 30 min  Charges:  $Gait Training: 8-22 mins $Therapeutic Exercise: 8-22 mins                    G Codes:       Greggory Stallion, PT, DPT [phone]    Jasa Dundon 09/30/2016, 10:52 AM

## 2016-09-30 NOTE — Progress Notes (Signed)
Pt alert and oriented. Medicated for pain with good results. Able to move bowels this shift after laxative given. PICC line infusing without difficulty. FBS during the night remaining under 120 and no sliding scale coverage needed. Scant amount of drainage to right surgical dressing. Minimal drainage to wound vac.

## 2016-09-30 NOTE — Progress Notes (Signed)
[place] [facility] [place] NAME: Kyle Banks    MR#:  [id number]  DATE OF BIRTH:  05/18/1955  CHIEF COMPLAINT:   Chief Complaint  Patient presents with  . Dizziness  . Wound Infection   Pain in feet well controlled. Had a bowel movement yesterday.   REVIEW OF SYSTEMS:   ROS   CONSTITUTIONAL: No fever. EYES: No blurred or double vision.  EARS, NOSE, AND THROAT: No tinnitus or ear pain.  RESPIRATORY: No cough, shortness of breath, wheezing or hemoptysis.  CARDIOVASCULAR: No chest pain, orthopnea, edema.  GASTROINTESTINAL: No nausea, vomiting, diarrhea or abdominal pain.  GENITOURINARY: No dysuria, hematuria.  ENDOCRINE: No polyuria, nocturia. HEMATOLOGY: No anemia, easy bruising or bleeding SKIN: No rash or lesion. MUSCULOSKELETAL: Status post amputation. Pain in both feet. PSYCHIATRY: No anxiety or depression.   DRUG ALLERGIES:  No Known Allergies  VITALS:  Blood pressure (!) 142/79, pulse 84, temperature 97.7 F (36.5 C), temperature source Oral, resp. rate 18, height 5\' 11"  (1.803 m), weight 98.9 kg (218 lb), SpO2 93 %.  PHYSICAL EXAMINATION:  GENERAL:  61 y.o.-year-old patient lying in the bed with no acute distress.  EYES: Pupils equal, round, reactive to light and  accommodation. No scleral icterus. Extraocular muscles intact.  HEENT: Head atraumatic, normocephalic. Oropharynx and nasopharynx clear.  NECK:  Supple, no jugular venous distention. No thyroid enlargement, no tenderness.  LUNGS: Normal breath sounds bilaterally, no wheezing, rales,rhonchi or crepitation. No use of accessory muscles of respiration.  CARDIOVASCULAR: S1, S2 normal. No murmurs, rubs, or gallops.  ABDOMEN: Soft, nontender, nondistended. Bowel sounds present. No organomegaly or mass.  EXTREMITIES;Dressing present to both feet. NEUROLOGIC: Cranial nerves II through XII are intact. Muscle strength 5/5 in all extremities. Sensation intact. Gait not  checked.  PSYCHIATRIC: The patient is alert and oriented x 3.  SKIN: Dressing present to both feet. Wound VAC in place  LABORATORY PANEL:   CBC  Recent Labs Lab 09/29/16 0404  WBC 16.5*  HGB 9.0*  HCT 26.5*  PLT 304   ------------------------------------------------------------------------------------------------------------------  Chemistries   Recent Labs Lab 09/24/16 0300 09/24/16 0307  09/29/16 0404 09/30/16 0347  NA  --  128*  < > 136  --   K  --  4.2  < > 3.7  --   CL  --  92*  < > 103  --   CO2  --  23  < > 26  --   GLUCOSE  --  460*  < > 143*  --   BUN  --  32*  < > 12  --   CREATININE  --  1.47*  < > 0.95 0.83  CALCIUM  --  8.1*  < > 7.7*  --   MG 2.0  --   --   --   --   AST  --  29  --   --   --   ALT  --  40  --   --   --   ALKPHOS  --  167*  --   --   --   BILITOT  --  1.1  --   --   --   < > = values in this interval not displayed. ------------------------------------------------------------------------------------------------------------------  Cardiac Enzymes No results for input(s): TROPONINI in the last 168 hours. ------------------------------------------------------------------------------------------------------------------  RADIOLOGY:  No results found.  EKG:   Orders placed or performed during the hospital encounter of 09/23/16  . ED EKG  .  ED EKG  . 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 Treated with broad-spectrum antibiotics initially. Now changed to Zosyn to cover for group B streptococcus, MSSA and anaerobes. Appreciate infectious disease input. Left foot debridement done on 09/28/2016. Wound VAC placed. Discussed with Dr. Elvina Mattes. Patient may end up needing a below-knee amputation on the left leg. Will be reassessed tomorrow. Will need 4 weeks of IV antibiotics. PICC line  placed  #. Diabetes mellitus type 2 uncontrolled HbA1c at 15  Patient has no PCP. Blood sugars were ranging at 400s.  On Lantus 30 units daily, NovoLog 5 units 3 times a day  blood sugar better controlled  #. Pseudo-Hyponatremia due to hyperglycemia resolved  # Metabolic encephalopathy secondary to sepsis.resolved  # Hypertriglyceridemia Likely from his uncontrolled diabetes. Started on TriCor this admission.  All the records are reviewed and case discussed with Care Management/Social Worker Management plans discussed with the patient, family and they are in agreement.  CODE STATUS: FULL CODE  TOTAL TIME TAKING CARE OF THIS PATIENT: 25 minutes  Hillary Bow R M.D on 09/30/2016 at 10:47 AM  Between 7am to 6pm - Pager - [phone]  After 6pm go to [email or web address] - password [email or web address] [facility]  Office  [phone]  CC: Primary care physician; Patient, No Pcp Per  Note: This dictation was prepared with Dragon dictation along with smaller phrase technology. Any transcriptional errors that result from this process are unintentional.

## 2016-09-30 NOTE — Progress Notes (Signed)
Patient Demographics  Kyle Banks, is a 61 y.o. male   MRN: [id number]   DOB - 08/24/1955  Admit Date - 09/23/2016    Outpatient Primary MD for the patient is Patient, No Pcp Per  Consult requested in the Hospital by Hillary Bow, MD, On 09/30/2016  With History of -  History reviewed. No pertinent past medical history.    Past Surgical History:  Procedure Laterality Date  . AMPUTATION TOE Bilateral 09/23/2016   Procedure: AMPUTATION TOE;  Surgeon: Samara Deist, DPM;  Location: [facility];  Service: Podiatry;  Laterality: Bilateral;  . INCISION AND DRAINAGE Left 09/23/2016   Procedure: INCISION AND DRAINAGE;  Surgeon: Samara Deist, DPM;  Location: [facility];  Service: Podiatry;  Laterality: Left;  . IRRIGATION AND DEBRIDEMENT FOOT Left 09/28/2016   Procedure: IRRIGATION AND DEBRIDEMENT FOOT;  Surgeon: Samara Deist, DPM;  Location: [facility];  Service: Podiatry;  Laterality: Left;  . JOINT REPLACEMENT     bilateral knee replacements miller and hower pt unsure of dates  . PERIPHERAL VASCULAR INTERVENTION Left 09/26/2016   Procedure: Peripheral Vascular Intervention;  Surgeon: Katha Cabal, MD;  Location: [facility];  Service: Cardiovascular;  Laterality: Left;    in for   Chief Complaint  Patient presents with  . Dizziness  . Wound Infection     HPI  Kyle Banks  is a 61 y.o. male, Underwent bilateral great toe amputations a week ago due to severe infection and both. Left foot was severely infected with progression proximally. Had no surgery on Friday to further debride the wounds and incise and drain the infection. This is on the left foot and ankle area. He states he's feeling better overall.   Social History Social History  Substance Use Topics  . Smoking status: Never Smoker  .  Smokeless tobacco: Never Used  . Alcohol use No   Family History History reviewed. No pertinent family history.   Prior to Admission medications   Medication Sig Start Date End Date Taking? Authorizing Provider  cyanocobalamin 500 MCG tablet Take 500 mcg by mouth daily.   Yes [provider]  Garlic 10 MG CAPS Take 1 tablet by mouth daily.   Yes [provider]  Omega-3 Fatty Acids (FISH OIL) 1000 MG CAPS Take 1 capsule by mouth daily.   Yes [provider]    Anti-infectives    Start     Dose/Rate Route Frequency Ordered Stop   09/28/16 1500  piperacillin-tazobactam (ZOSYN) IVPB 3.375 g     3.375 g 12.5 mL/hr over 240 Minutes Intravenous Every 8 hours 09/28/16 1415     09/28/16 1400  ceftAZIDime (FORTAZ) 2 gram/50 mL D5W IVPB - DUPLEX  Status:  Discontinued     2 g 100 mL/hr over 30 Minutes Intravenous Every 8 hours 09/28/16 0813 09/28/16 1408   09/28/16 0600  cefTAZidime (FORTAZ) 2 g in dextrose 5 % 50 mL IVPB  Status:  Discontinued     2 g 100 mL/hr over 30 Minutes Intravenous Every 8 hours 09/27/16 1621 09/28/16 0813   09/26/16 2200  ceftAZIDime (FORTAZ) 2 gram/50 mL D5W IVPB - DUPLEX  Status:  Discontinued  2 g 100 mL/hr over 30 Minutes Intravenous Every 8 hours 09/26/16 0802 09/27/16 1620   09/25/16 2200  ceftAZIDime (FORTAZ) 2 gram/50 mL D5W IVPB - DUPLEX  Status:  Discontinued     2 g 100 mL/hr over 30 Minutes Intravenous Every 8 hours 09/25/16 0828 09/25/16 1950   09/25/16 2200  ceftAZIDime (FORTAZ) 2 gram/50 mL D5W IVPB - DUPLEX  Status:  Discontinued     2 g 100 mL/hr over 30 Minutes Intravenous Every 8 hours 09/25/16 1951 09/25/16 1951   09/25/16 2200  cefTAZidime (FORTAZ) 2 g in dextrose 5 % 50 mL IVPB     2 g 100 mL/hr over 30 Minutes Intravenous Every 8 hours 09/25/16 1952 09/26/16 1334   09/25/16 1600  vancomycin (VANCOCIN) 1,250 mg in sodium chloride 0.9 % 250 mL IVPB  Status:  Discontinued     1,250 mg 166.7 mL/hr over 90 Minutes  Intravenous Every 12 hours 09/25/16 1553 09/25/16 1555   09/25/16 1600  vancomycin (VANCOCIN) 1,500 mg in sodium chloride 0.9 % 500 mL IVPB  Status:  Discontinued     1,500 mg 250 mL/hr over 120 Minutes Intravenous Every 12 hours 09/25/16 1557 09/28/16 1408   09/24/16 1600  cefTAZidime (FORTAZ) 2 g in dextrose 5 % 50 mL IVPB  Status:  Discontinued     2 g 100 mL/hr over 30 Minutes Intravenous Every 8 hours 09/24/16 1530 09/25/16 0828   09/24/16 1530  metroNIDAZOLE (FLAGYL) tablet 500 mg     500 mg Oral Every 8 hours 09/24/16 1528     09/24/16 0500  piperacillin-tazobactam (ZOSYN) IVPB 3.375 g  Status:  Discontinued     3.375 g 12.5 mL/hr over 240 Minutes Intravenous Every 8 hours 09/23/16 2133 09/24/16 1528   09/24/16 0400  vancomycin (VANCOCIN) IVPB 1000 mg/200 mL premix  Status:  Discontinued     1,000 mg 200 mL/hr over 60 Minutes Intravenous Every 12 hours 09/23/16 2133 09/25/16 1553   09/24/16 0230  piperacillin-tazobactam (ZOSYN) IVPB 3.375 g  Status:  Discontinued     3.375 g 100 mL/hr over 30 Minutes Intravenous  Once 09/24/16 0223 09/24/16 0226   09/24/16 0230  vancomycin (VANCOCIN) IVPB 1000 mg/200 mL premix  Status:  Discontinued     1,000 mg 200 mL/hr over 60 Minutes Intravenous  Once 09/24/16 0223 09/24/16 0227   09/23/16 2130  piperacillin-tazobactam (ZOSYN) IVPB 3.375 g     3.375 g 100 mL/hr over 30 Minutes Intravenous  Once 09/23/16 2119 09/23/16 2203   09/23/16 2130  vancomycin (VANCOCIN) IVPB 1000 mg/200 mL premix     1,000 mg 200 mL/hr over 60 Minutes Intravenous  Once 09/23/16 2119 09/23/16 2247      Scheduled Meds: . aspirin EC  81 mg Oral Daily  . chlorhexidine  60 mL Topical Once  . enoxaparin (LOVENOX) injection  40 mg Subcutaneous Q24H  . fenofibrate  160 mg Oral Daily  . insulin aspart  0-20 Units Subcutaneous Q4H  . insulin aspart  5 Units Subcutaneous TID WC  . insulin glargine  30 Units Subcutaneous Q24H  . insulin starter kit- pen needles  1 kit  Other Once  . metroNIDAZOLE  500 mg Oral Q8H  . senna-docusate  1 tablet Oral QHS  . sodium chloride flush  10-40 mL Intracatheter Q12H  . sodium chloride flush  3 mL Intravenous Q12H   Continuous Infusions: . sodium chloride 100 mL/hr at 09/29/16 1923  . piperacillin-tazobactam (ZOSYN)  IV Stopped (09/30/16 0900)  PRN Meds:.acetaminophen **OR** acetaminophen, albuterol, alum & mag hydroxide-simeth, bisacodyl, ipratropium, magnesium citrate, morphine injection, ondansetron **OR** ondansetron (ZOFRAN) IV, oxyCODONE, senna-docusate, sodium chloride, sodium chloride flush, zolpidem  No Known Allergies  Physical Exam  Vitals  Blood pressure (!) 142/79, pulse 84, temperature 97.7 F (36.5 C), temperature source Oral, resp. rate 18, height 5' 11"  (1.803 m), weight 98.9 kg (218 lb), SpO2 93 %.  Lower Extremity exam:Right foot was examined today looks good and the incision margin was clean mostly well coapted. No evidence of infection or cellulitis is noted to this toe. The left foot has a wound VAC over the first metatarsal region there is open incisions that had been packed at the mediolateral ankle and dorsum of the foot. These are examined today. There is concern still that these wounds are in jeopardy of not healing. There is still swelling to the dorsum of the foot in spite of having a wound VAC on and open areas for drainage. He has had vascular reconstruction earlier this week. Wound VAC is functional. Data Review  CBC  Recent Labs Lab 09/25/16 0405 09/26/16 0350 09/27/16 0334 09/28/16 0522 09/29/16 0404  WBC 19.7* 18.4* 15.9* 16.5* 16.5*  HGB 9.2* 9.1* 9.5* 9.5* 9.0*  HCT 27.5* 26.6* 28.5* 27.7* 26.5*  PLT 198 223 246 278 304  MCV 85.3 83.7 83.6 82.9 83.5  MCH 28.6 28.6 27.9 28.3 28.3  MCHC 33.5 34.2 33.3 34.2 33.9  RDW 14.1 14.3 14.4 14.4 14.5  LYMPHSABS  --   --   --  0.5*  --   MONOABS  --   --   --  2.5*  --   EOSABS  --   --   --  0.2  --   BASOSABS  --   --   --   0.0  --    ------------------------------------------------------------------------------------------------------------------  Chemistries   Recent Labs Lab 09/24/16 0300 09/24/16 0307 09/25/16 0405 09/26/16 0350 09/28/16 0522 09/28/16 1431 09/29/16 0404 09/30/16 0347  NA  --  128* 132* 134* 136  --  136  --   K  --  4.2 3.5 3.6 2.9* 3.7 3.7  --   CL  --  92* 98* 101 102  --  103  --   CO2  --  23 27 26 28   --  26  --   GLUCOSE  --  460* 202* 151* 120*  --  143*  --   BUN  --  32* 20 16 11   --  12  --   CREATININE  --  1.47* 1.06 0.97 0.94  --  0.95 0.83  CALCIUM  --  8.1* 8.0* 8.0* 7.7*  --  7.7*  --   MG 2.0  --   --   --   --   --   --   --   AST  --  29  --   --   --   --   --   --   ALT  --  40  --   --   --   --   --   --   ALKPHOS  --  167*  --   --   --   --   --   --   BILITOT  --  1.1  --   --   --   --   --   --    ------- Assessment & Plan: Right foot is doing well appears to be stable. I  have concerns about the viability of the left foot at this point. Color of the toes is decent but the infection to the left foot in general is of concern. Patient is on IV antibiotics this point and is overall feeling better and was able to eat today and doesn't having more nausea. His white count is dropped considerably but still at a 16.4.  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   Family Communication: Plan discussed with patient Perry Mount M.D on 09/30/2016 at 10:31 AM  Thank you for the consult, we will follow the patient with you in the Hospital.

## 2016-10-01 LAB — GLUCOSE, CAPILLARY
GLUCOSE-CAPILLARY: 115 mg/dL — AB (ref 65–99)
GLUCOSE-CAPILLARY: 120 mg/dL — AB (ref 65–99)
Glucose-Capillary: 114 mg/dL — ABNORMAL HIGH (ref 65–99)
Glucose-Capillary: 148 mg/dL — ABNORMAL HIGH (ref 65–99)
Glucose-Capillary: 153 mg/dL — ABNORMAL HIGH (ref 65–99)
Glucose-Capillary: 193 mg/dL — ABNORMAL HIGH (ref 65–99)
Glucose-Capillary: 84 mg/dL (ref 65–99)

## 2016-10-01 LAB — CULTURE, BLOOD (ROUTINE X 2)
Culture: NO GROWTH
Culture: NO GROWTH

## 2016-10-01 MED ORDER — INSULIN GLARGINE 100 UNIT/ML ~~LOC~~ SOLN
26.0000 [IU] | SUBCUTANEOUS | Status: DC
  Administered 2016-10-01 – 2016-10-10 (×10): 26 [IU] via SUBCUTANEOUS
  Filled 2016-10-01 (×11): qty 0.26

## 2016-10-01 NOTE — Progress Notes (Signed)
[place] at [place] NAME: Kyle Banks    MR#:  [id number]  DATE OF BIRTH:  Nov 06, 1955  CHIEF COMPLAINT:   Chief Complaint  Patient presents with  . Dizziness  . Wound Infection   No further abdominal pain. Has had bowel movements. Work with physical therapy and sat in a chair yesterday. Plan is to go to skilled nursing facility with IV antibiotics.  He has wound VAC to his left foot.   REVIEW OF SYSTEMS:   ROS   CONSTITUTIONAL: No fever. EYES: No blurred or double vision.  EARS, NOSE, AND THROAT: No tinnitus or ear pain.  RESPIRATORY: No cough, shortness of breath, wheezing or hemoptysis.  CARDIOVASCULAR: No chest pain, orthopnea, edema.  GASTROINTESTINAL: No nausea, vomiting, diarrhea or abdominal pain.  GENITOURINARY: No dysuria, hematuria.  ENDOCRINE: No polyuria, nocturia. HEMATOLOGY: No anemia, easy bruising or bleeding SKIN: No rash or lesion. MUSCULOSKELETAL: Status post amputation. Pain in both feet. PSYCHIATRY: No anxiety or depression.   DRUG ALLERGIES:  No Known Allergies  VITALS:  Blood pressure 127/66, pulse 69, temperature 97.9 F (36.6 C), temperature source Oral, resp. rate 18, height 5\' 11"  (1.803 m), weight 98.9 kg (218 lb), SpO2 95 %.  PHYSICAL EXAMINATION:  GENERAL:  61 y.o.-year-old patient lying in the bed with no acute distress.  EYES: Pupils equal, round, reactive to light and  accommodation. No scleral icterus. Extraocular muscles intact.  HEENT: Head atraumatic, normocephalic. Oropharynx and nasopharynx clear.  NECK:  Supple, no jugular venous distention. No thyroid enlargement, no tenderness.  LUNGS: Normal breath sounds bilaterally, no wheezing, rales,rhonchi or crepitation. No use of accessory muscles of respiration.  CARDIOVASCULAR: S1, S2 normal. No murmurs, rubs, or gallops.  ABDOMEN: Soft, nontender, nondistended. Bowel sounds present. No organomegaly or mass.  EXTREMITIES;Dressing  present to both feet. NEUROLOGIC: Cranial nerves II through XII are intact. Muscle strength 5/5 in all extremities. Sensation intact. Gait not checked.  PSYCHIATRIC: The patient is alert and oriented x 3.  SKIN: Dressing present to both feet. Wound VAC in place  LABORATORY PANEL:   CBC  Recent Labs Lab 09/29/16 0404  WBC 16.5*  HGB 9.0*  HCT 26.5*  PLT 304   ------------------------------------------------------------------------------------------------------------------  Chemistries   Recent Labs Lab 09/29/16 0404 09/30/16 0347  NA 136  --   K 3.7  --   CL 103  --   CO2 26  --   GLUCOSE 143*  --   BUN 12  --   CREATININE 0.95 0.83  CALCIUM 7.7*  --    ------------------------------------------------------------------------------------------------------------------  Cardiac Enzymes No results for input(s): TROPONINI in the last 168 hours. ------------------------------------------------------------------------------------------------------------------  RADIOLOGY:  No results found.  EKG:   Orders placed or performed during the hospital encounter of 09/23/16  . ED EKG  . 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 Treated with broad-spectrum antibiotics initially. Now changed to Zosyn to cover for group B streptococcus, MSSA and anaerobes. Appreciate infectious disease input. Left foot debridement done on 09/28/2016. Wound VAC placed. Discussed with Podiatry. Patient may end up needing a below-knee amputation on the left leg. Will be reassessed again today. Will need 4 weeks of IV antibiotics. PICC line placed  #. Diabetes mellitus type 2 uncontrolled HbA1c at 15  Patient has no PCP. Blood sugars were  ranging at 400s.  On Lantus 30 units daily, NovoLog 5 units 3 times a day  blood sugar better controlled but was blood  sugar has been 80 9 in the morning. We will decrease his Lantus to 26 units daily.  #. Pseudo-Hyponatremia due to hyperglycemia resolved  # Metabolic encephalopathy secondary to sepsis.resolved  # Hypertriglyceridemia Likely from his uncontrolled diabetes. Started on TriCor this admission.  All the records are reviewed and case discussed with Care Management/Social Worker. Management plans discussed with the patient, family and they are in agreement.  CODE STATUS: FULL CODE  TOTAL TIME TAKING CARE OF THIS PATIENT: 25 minutes  Hillary Bow R M.D on 10/01/2016 at 10:29 AM  Between 7am to 6pm - Pager - [phone]  After 6pm go to [email or web address] - password [email or web address] [facility]  Office  [phone]  CC: Primary care physician; Patient, No Pcp Per  Note: This dictation was prepared with Dragon dictation along with smaller phrase technology. Any transcriptional errors that result from this process are unintentional.

## 2016-10-01 NOTE — Progress Notes (Signed)
Daily Progress Note   Subjective  - 3 Days Post-Op  F/u b/l foot infection  Objective Vitals:   09/30/16 1356 10/01/16 0000 10/01/16 0757 10/01/16 1546  BP: (!) 127/57 132/68 127/66 119/71  Pulse: 83 67 69 87  Resp: 18 18 18 16   Temp:  97.7 F (36.5 C) 97.9 F (36.6 C) 98.8 F (37.1 C)  TempSrc:  Oral Oral Oral  SpO2: 93% 94% 95% 94%  Weight:      Height:        Physical Exam: Right foot.  Wound healing nicely.  Mild bloody drainage from packing site.  No purulence.  Left foot:  Wound vac changed.  Still mixed purulent/serrous drainage from dorsolateral foot.  Exposed 1st metatatarsal.  Superficial skin necrosis to midfoot and anterior ankle. Incisions on medial and lateral ankle without severe drainage at this time.  Medial ankle insicision with some mild skin necrosis.  Culture: mixed with MSSA, strep, and GNR's. Laboratory CBC    Component Value Date/Time   WBC 16.5 (H) 09/29/2016 0404   HGB 9.0 (L) 09/29/2016 0404   HCT 26.5 (L) 09/29/2016 0404   PLT 304 09/29/2016 0404    BMET    Component Value Date/Time   NA 136 09/29/2016 0404   K 3.7 09/29/2016 0404   CL 103 09/29/2016 0404   CO2 26 09/29/2016 0404   GLUCOSE 143 (H) 09/29/2016 0404   BUN 12 09/29/2016 0404   CREATININE 0.83 09/30/2016 0347   CALCIUM 7.7 (L) 09/29/2016 0404   GFRNONAA >60 09/30/2016 0347   GFRAA >60 09/30/2016 0347    Assessment/Planning: Osteomyelitis with sepsis, soft tissue gas infection left foot s/p I & D with toe amputations.   Dressing changed today.  Still at high risk of BKA on left.  Need to continue to monitor skin necrosis and purulence to left foot.  Pt on Zosyn per ID.  Will d/w Dr. Cleda Mccreedy, he will follow remainder of this week through weekend.  For limb salvage suspect will need further debridement and removal of 1st metatarsal.  If wound worsens will recommend BKA on left.  Right is doing well.   Samara Deist A  10/01/2016, 5:19 PM

## 2016-10-01 NOTE — Progress Notes (Signed)
Per RN in progression rounds this morning patient may have more surgery and is not medically stable for D/C today. [name] is aware of above and is working on [employer]. Clinical Social Worker (CSW) will continue to follow and assist as needed.   [employer], LCSW [phone]

## 2016-10-01 NOTE — Progress Notes (Signed)
Pharmacy Antibiotic Note  Kyle Banks is a 61 y.o. male admitted on 09/23/2016 with sepsis.  Pharmacy was consulted for Zosyn. ID following.  Plan: Zosyn 3.375 g IV q8h EI  Height: 5\' 11"  (180.3 cm) Weight: 218 lb (98.9 kg) IBW/kg (Calculated) : 75.3  Temp (24hrs), Avg:97.8 F (36.6 C), Min:97.7 F (36.5 C), Max:97.9 F (36.6 C)   Recent Labs Lab 09/25/16 0405 09/25/16 1523 09/26/16 0350 09/27/16 0334 09/28/16 0522 09/29/16 0404 09/30/16 0347  WBC 19.7*  --  18.4* 15.9* 16.5* 16.5*  --   CREATININE 1.06  --  0.97  --  0.94 0.95 0.83  VANCOTROUGH  --  10*  --  7*  --   --   --     Estimated Creatinine Clearance: 112 mL/min (by C-G formula based on SCr of 0.83 mg/dL).    No Known Allergies  Thank you for allowing pharmacy to be a part of this patient's care.  Anushka Hartinger D, Pharm.D., BCPS Clinical Pharmacist 10/01/2016 10:33 AM

## 2016-10-01 NOTE — Progress Notes (Signed)
Physical Therapy Treatment Patient Details Name: Kyle Banks MRN: [id number] DOB: 14-Mar-1956 Today's Date: 10/01/2016    History of Present Illness Kyle Banks is a 61 y.o. male with no known medical history presents to the emergency department for evaluation of foot wounds. Patient was in a usual state of health until 3 days ago when he describes the sudden onset of redness, pain, swelling and discharge from his feet bilaterally. He states prior to this infection his feet appeared normal. Patient is unaware of any medical problems such as diabetes and states that he has had annual physicals and blood work at various urgent care centers. Patient denies fevers/chills, weakness, dizziness, chest pain, shortness of breath, N/V/C/D, abdominal pain, dysuria/frequency, changes in mental status. Otherwise, there has been no change in status. Patient has been taking medication as prescribed and there has been no recent change in medication or diet.  No recent antibiotics.  There has been no recent illness, hospitalizations, travel or sick contacts. 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 Attempted to peform PT evaluation on 09/24/16 however pt with myoclonic jerks, dizziness, and facial droop when attempting ambulation. Head CT negative for acute infarct. Pt is now s/p I&D on 6/15. Received continue upon transfer order. Pt is now NWB on L LE.   PT Comments    Pt progressing well toward goals. Pt demonstrated improved endurance with supine there-ex (12 reps of increased number of exercises with only one rest break needed due to fatigue). Pt transferred from EOB to recliner (19ft) with +2 mod assist. He was better able to pivot on his R LE and maintain his NWB status on his L LE (required min verbal cueing this session). Pt would benefit from continued PT services to improve his functional mobility, standing dynamic balance, and  strength to promote return to PLOF. Will continue to progress.    Follow Up Recommendations  SNF     Equipment Recommendations  Rolling walker with 5" wheels    Recommendations for Other Services       Precautions / Restrictions Precautions Precautions: Fall Required Braces or Orthoses:  (B post op shoes) Restrictions Weight Bearing Restrictions: Yes LLE Weight Bearing: Non weight bearing    Mobility  Bed Mobility Overal bed mobility: Needs Assistance Bed Mobility: Supine to Sit     Supine to sit: Supervision     General bed mobility comments: No assist needed to manage LE's this session. No dizziness once EOB. Pt able to perform seated balance exercises (UE reaching) without LOB noted.  Transfers Overall transfer level: Needs assistance Equipment used: Rolling walker (2 wheeled) Transfers: Sit to/from Stand Sit to Stand: Mod assist;+2 physical assistance;+2 safety/equipment         General transfer comment: Pt struggled to transfer off EOB, but was able to complete task on first attempt with +2 mod assist. Cues for hand placement and for NWB on LLE when coming to standing position. Pt steady once standing upright with hands on RW.  Ambulation/Gait Ambulation/Gait assistance: Mod assist;+2 physical assistance Ambulation Distance (Feet): 2 Feet Assistive device: Rolling walker (2 wheeled) Gait Pattern/deviations:  (Pivot on R LE)     General Gait Details: Pt pivoted (2x) on R LE to ambulate to chair with RW and +2 mod assist. Moderate verbal cueing to maintain NWB on LLE, which pt better able to follow today compared to last session. Cueing for hand placement on recliner prior to stand  to sit transfer.   Stairs            Wheelchair Mobility    Modified Rankin (Stroke Patients Only)       Balance Overall balance assessment: Needs assistance             Standing balance comment: Pt steady when standing statically on RLE, but required occasional  mod assist with B UE support on RW when pivoting on RLE to transfer to recliner from EOB.                            Cognition Arousal/Alertness: Awake/alert Behavior During Therapy: WFL for tasks assessed/performed Overall Cognitive Status: Within Functional Limits for tasks assessed                                        Exercises Other Exercises Other Exercises: Supine ther-ex x12 B included: quad set, SLR's, hip abd, and glute squeeze. Pt unable to full straighten L knee secondary to swelling. Ther-ex performed with supervision and cueing for sequencing.    General Comments        Pertinent Vitals/Pain Pain Assessment: 0-10 Pain Score: 4  Pain Location: L foot Pain Descriptors / Indicators: Operative site guarding Pain Intervention(s): Limited activity within patient's tolerance;Monitored during session;Patient requesting pain meds-RN notified;Repositioned    Home Living                      Prior Function            PT Goals (current goals can now be found in the care plan section) Acute Rehab PT Goals Patient Stated Goal: Return to prior function at home PT Goal Formulation: With patient Time For Goal Achievement: 10/09/16 Potential to Achieve Goals: Good Progress towards PT goals: Progressing toward goals    Frequency    7X/week      PT Plan Current plan remains appropriate    Co-evaluation              AM-PAC PT "6 Clicks" Daily Activity  Outcome Measure  Difficulty turning over in bed (including adjusting bedclothes, sheets and blankets)?: A Lot Difficulty moving from lying on back to sitting on the side of the bed? : A Lot Difficulty sitting down on and standing up from a chair with arms (e.g., wheelchair, bedside commode, etc,.)?: Total Help needed moving to and from a bed to chair (including a wheelchair)?: A Lot Help needed walking in hospital room?: A Lot Help needed climbing 3-5 steps with a railing? :  Total 6 Click Score: 10    End of Session Equipment Utilized During Treatment: Gait belt Activity Tolerance: Patient tolerated treatment well Patient left: in chair;with call bell/phone within reach;with SCD's reapplied Nurse Communication: Mobility status;Patient requests pain meds PT Visit Diagnosis: Unsteadiness on feet (R26.81);Muscle weakness (generalized) (M62.81)     Time: 2694-8546 PT Time Calculation (min) (ACUTE ONLY): 30 min  Charges:                       G Codes:       Donaciano Eva, PT, SPT  [place] 10/01/2016, 10:59 AM

## 2016-10-01 NOTE — Progress Notes (Signed)
[facility] INFECTIOUS DISE[facility] PROGRESS NOTE Date of Admission:  09/23/2016     ID: Kyle Banks is a 60 y.o. male with  Active Problems:   Sepsis due to cellulitis [facility])  Subjective: No fevers, more alert  ROS  Eleven systems are reviewed and negative except per hpi  Medications:  Antibiotics Given (last 72 hours)    Date/Time Action Medication Dose Rate   09/28/16 2110 Given   metroNIDAZOLE (FLAGYL) tablet 500 mg 500 mg    09/28/16 2150 New Bag/Given   piperacillin-tazobactam (ZOSYN) IVPB 3.375 g 3.375 g 12.5 mL/hr   09/29/16 0504 Given   metroNIDAZOLE (FLAGYL) tablet 500 mg 500 mg    09/29/16 0504 New Bag/Given   piperacillin-tazobactam (ZOSYN) IVPB 3.375 g 3.375 g 12.5 mL/hr   09/29/16 1359 Given   metroNIDAZOLE (FLAGYL) tablet 500 mg 500 mg    09/29/16 1359 New Bag/Given   piperacillin-tazobactam (ZOSYN) IVPB 3.375 g 3.375 g 12.5 mL/hr   09/29/16 2126 Given   metroNIDAZOLE (FLAGYL) tablet 500 mg 500 mg    09/29/16 2127 New Bag/Given   piperacillin-tazobactam (ZOSYN) IVPB 3.375 g 3.375 g 12.5 mL/hr   09/30/16 0500 Given   metroNIDAZOLE (FLAGYL) tablet 500 mg 500 mg    09/30/16 0500 New Bag/Given   piperacillin-tazobactam (ZOSYN) IVPB 3.375 g 3.375 g 12.5 mL/hr   09/30/16 1327 New Bag/Given   piperacillin-tazobactam (ZOSYN) IVPB 3.375 g 3.375 g 12.5 mL/hr   09/30/16 2235 New Bag/Given   piperacillin-tazobactam (ZOSYN) IVPB 3.375 g 3.375 g 12.5 mL/hr   10/01/16 0504 New Bag/Given   piperacillin-tazobactam (ZOSYN) IVPB 3.375 g 3.375 g 12.5 mL/hr     . aspirin EC  81 mg Oral Daily  . chlorhexidine  60 mL Topical Once  . enoxaparin (LOVENOX) injection  40 mg Subcutaneous Q24H  . fenofibrate  160 mg Oral Daily  . insulin aspart  0-20 Units Subcutaneous Q4H  . insulin aspart  5 Units Subcutaneous TID WC  . insulin glargine  26 Units Subcutaneous Q24H  . insulin starter kit- pen needles  1 kit Other Once  . senna-docusate  2 tablet Oral BID  . sodium chloride flush   10-40 mL Intracatheter Q12H  . sodium chloride flush  3 mL Intravenous Q12H    Objective: Vital signs in last 24 hours: Temp:  [97.7 F (36.5 C)-97.9 F (36.6 C)] 97.9 F (36.6 C) (06/18 0757) Pulse Rate:  [67-83] 69 (06/18 0757) Resp:  [18] 18 (06/18 0757) BP: (127-132)/(57-68) 127/66 (06/18 0757) SpO2:  [93 %-95 %] 95 % (06/18 0757)  Constitutional: He is obese, awake, interactive HENT: anicteric Mouth/Throat: Oropharynx is clear and dry. No oropharyngeal exudate.  Cardiovascular: Tachycardia, regular Pulmonary/Chest: Effort normal and breath sounds normal.  Abdominal: Soft. Obese Bowel sounds are normal. He exhibits no distension. There is no tenderness.  Lymphadenopathy: He has no cervical adenopathy.  Neurological: He is alert and oriented to person, place, and time.  Skin: bil leg wrapped L in wound vac Psychiatric: interactive  Lab Results  Recent Labs  09/28/16 1431 09/29/16 0404 09/30/16 0347  WBC  --  16.5*  --   HGB  --  9.0*  --   HCT  --  26.5*  --   NA  --  136  --   K 3.7 3.7  --   CL  --  103  --   CO2  --  26  --   BUN  --  12  --   CREATININE  --  0.95 0.83   Lab Results  Component Value Date   ESRSEDRATE 107 (H) 09/25/2016   Lab Results  Component Value Date   CRP 35.6 (H) 09/25/2016    Microbiology: Results for orders placed or performed during the hospital encounter of 09/23/16  Blood culture (routine x 2)     Status: Abnormal   Collection Time: 09/23/16  7:44 PM  Result Value Ref Range Status   Specimen Description BLOOD LEFT ANTECUBITAL  Final   Special Requests   Final    BOTTLES DRAWN AEROBIC AND ANAEROBIC Blood Culture adequate volume   Culture  Setup Time   Final    GRAM POSITIVE COCCI IN BOTH AEROBIC AND ANAEROBIC BOTTLES CRITICAL RESULT CALLED TO, READ BACK BY AND VERIFIED WITH: JASON ROBBINS AT 4287 ON 09/24/2016 JJB    Culture (A)  Final    GROUP B STREP(S.AGALACTIAE)ISOLATED SUSCEPTIBILITIES PERFORMED ON PREVIOUS  CULTURE WITHIN THE LAST 5 DAYS.    Report Status 09/27/2016 FINAL  Final  Blood culture (routine x 2)     Status: Abnormal   Collection Time: 09/23/16  7:44 PM  Result Value Ref Range Status   Specimen Description BLOOD RIGHT ANTECUBITAL  Final   Special Requests   Final    BOTTLES DRAWN AEROBIC AND ANAEROBIC Blood Culture adequate volume   Culture  Setup Time   Final    GRAM POSITIVE COCCI IN BOTH AEROBIC AND ANAEROBIC BOTTLES CRITICAL RESULT CALLED TO, READ BACK BY AND VERIFIED WITH: JASON ROBBINS AT 6811 ON 09/24/2016 JJB Performed at [facility], [address], [place], [place] [address]    Culture GROUP B STREP(S.AGALACTIAE)ISOLATED (A)  Final   Report Status 09/27/2016 FINAL  Final   Organism ID, Bacteria GROUP B STREP(S.AGALACTIAE)ISOLATED  Final      Susceptibility   Group b strep(s.agalactiae)isolated - MIC*    CLINDAMYCIN <=0.25 SENSITIVE Sensitive     AMPICILLIN <=0.25 SENSITIVE Sensitive     ERYTHROMYCIN <=0.12 SENSITIVE Sensitive     VANCOMYCIN 0.5 SENSITIVE Sensitive     CEFTRIAXONE <=0.12 SENSITIVE Sensitive     LEVOFLOXACIN 1 SENSITIVE Sensitive     * GROUP B STREP(S.AGALACTIAE)ISOLATED  Blood Culture ID Panel (Reflexed)     Status: Abnormal   Collection Time: 09/23/16  7:44 PM  Result Value Ref Range Status   Enterococcus species NOT DETECTED NOT DETECTED Final   Listeria monocytogenes NOT DETECTED NOT DETECTED Final   Staphylococcus species NOT DETECTED NOT DETECTED Final   Staphylococcus aureus NOT DETECTED NOT DETECTED Final   Streptococcus species DETECTED (A) NOT DETECTED Final    Comment: CRITICAL RESULT CALLED TO, READ BACK BY AND VERIFIED WITH: JASON ROBBINS AT 1608 ON 09/24/2016 JJB    Streptococcus agalactiae DETECTED (A) NOT DETECTED Final    Comment: CRITICAL RESULT CALLED TO, READ BACK BY AND VERIFIED WITH: JASON ROBBINS AT 1608 ON 09/24/2016 JJB    Streptococcus pneumoniae NOT DETECTED NOT DETECTED Final   Streptococcus pyogenes NOT  DETECTED NOT DETECTED Final   Acinetobacter baumannii NOT DETECTED NOT DETECTED Final   Enterobacteriaceae species NOT DETECTED NOT DETECTED Final   Enterobacter cloacae complex NOT DETECTED NOT DETECTED Final   Escherichia coli NOT DETECTED NOT DETECTED Final   Klebsiella oxytoca NOT DETECTED NOT DETECTED Final   Klebsiella pneumoniae NOT DETECTED NOT DETECTED Final   Proteus species NOT DETECTED NOT DETECTED Final   Serratia marcescens NOT DETECTED NOT DETECTED Final   Haemophilus influenzae NOT DETECTED NOT DETECTED Final   Neisseria  meningitidis NOT DETECTED NOT DETECTED Final   Pseudomonas aeruginosa NOT DETECTED NOT DETECTED Final   Candida albicans NOT DETECTED NOT DETECTED Final   Candida glabrata NOT DETECTED NOT DETECTED Final   Candida krusei NOT DETECTED NOT DETECTED Final   Candida parapsilosis NOT DETECTED NOT DETECTED Final   Candida tropicalis NOT DETECTED NOT DETECTED Final  Aerobic/Anaerobic Culture (surgical/deep wound)     Status: None   Collection Time: 09/24/16 12:34 AM  Result Value Ref Range Status   Specimen Description WOUND RIGHT GREAT TOE  Final   Special Requests NONE  Final   Gram Stain   Final    RARE WBC PRESENT, PREDOMINANTLY PMN RARE SQUAMOUS EPITHELIAL CELLS PRESENT ABUNDANT GRAM POSITIVE COCCI IN PAIRS MODERATE GRAM NEGATIVE RODS RARE GRAM POSITIVE RODS    Culture   Final    MODERATE GROUP B STREP(S.AGALACTIAE)ISOLATED TESTING AGAINST S. AGALACTIAE NOT ROUTINELY PERFORMED DUE TO PREDICTABILITY OF AMP/PEN/VAN SUSCEPTIBILITY. FEW STAPHYLOCOCCUS AUREUS FEW PREVOTELLA BIVIA BETA LACTAMASE POSITIVE Performed at [facility], [place] [address], [place], [place] [address]    Report Status 09/28/2016 FINAL  Final   Organism ID, Bacteria STAPHYLOCOCCUS AUREUS  Final      Susceptibility   Staphylococcus aureus - MIC*    CIPROFLOXACIN <=0.5 SENSITIVE Sensitive     ERYTHROMYCIN >=8 RESISTANT Resistant     GENTAMICIN <=0.5 SENSITIVE Sensitive      OXACILLIN <=0.25 SENSITIVE Sensitive     TETRACYCLINE <=1 SENSITIVE Sensitive     VANCOMYCIN <=0.5 SENSITIVE Sensitive     TRIMETH/SULFA <=10 SENSITIVE Sensitive     CLINDAMYCIN RESISTANT Resistant     RIFAMPIN <=0.5 SENSITIVE Sensitive     Inducible Clindamycin POSITIVE Resistant     * FEW STAPHYLOCOCCUS AUREUS  Aerobic/Anaerobic Culture (surgical/deep wound)     Status: None   Collection Time: 09/24/16 12:35 AM  Result Value Ref Range Status   Specimen Description WOUND LEFT GREAT TOE  Final   Special Requests NONE  Final   Gram Stain   Final    FEW WBC PRESENT, PREDOMINANTLY PMN ABUNDANT GRAM NEGATIVE RODS IN PAIRS ABUNDANT GRAM NEGATIVE RODS FEW GRAM POSITIVE RODS    Culture   Final    ABUNDANT GROUP B STREP(S.AGALACTIAE)ISOLATED TESTING AGAINST S. AGALACTIAE NOT ROUTINELY PERFORMED DUE TO PREDICTABILITY OF AMP/PEN/VAN SUSCEPTIBILITY. ABUNDANT PREVOTELLA BIVIA BETA LACTAMASE POSITIVE Performed at [facility], [place] [address] [place], [place] [address]    Report Status 09/28/2016 FINAL  Final  CULTURE, BLOOD (ROUTINE X 2) w Reflex to ID Panel     Status: None   Collection Time: 09/26/16  7:14 PM  Result Value Ref Range Status   Specimen Description BLOOD BLOOD RIGHT HAND  Final   Special Requests   Final    BOTTLES DRAWN AEROBIC AND ANAEROBIC Blood Culture results may not be optimal due to an excessive volume of blood received in culture bottles   Culture NO GROWTH 5 DAYS  Final   Report Status 10/01/2016 FINAL  Final  CULTURE, BLOOD (ROUTINE X 2) w Reflex to ID Panel     Status: None   Collection Time: 09/26/16  7:22 PM  Result Value Ref Range Status   Specimen Description BLOOD [facility]  Final   Special Requests   Final    BOTTLES DRAWN AEROBIC AND ANAEROBIC Blood Culture results may not be optimal due to an excessive volume of blood received in culture bottles   Culture NO GROWTH 5 DAYS  Final  Report Status 10/01/2016 FINAL  Final  MRSA PCR Screening     Status:  None   Collection Time: 09/27/16  5:07 PM  Result Value Ref Range Status   MRSA by PCR NEGATIVE NEGATIVE Final    Comment:        The GeneXpert MRSA Assay (FDA approved for NASAL specimens only), is one component of a comprehensive MRSA colonization surveillance program. It is not intended to diagnose MRSA infection nor to guide or monitor treatment for MRSA infections.   Aerobic/Anaerobic Culture (surgical/deep wound)     Status: None (Preliminary result)   Collection Time: 09/28/16  4:22 PM  Result Value Ref Range Status   Specimen Description ABSCESS  Final   Special Requests NONE  Final   Gram Stain   Final    MODERATE WBC PRESENT, PREDOMINANTLY PMN RARE SQUAMOUS EPITHELIAL CELLS PRESENT NO ORGANISMS SEEN    Culture   Final    CULTURE REINCUBATED FOR BETTER GROWTH Performed at [facility], [place] [address] [place], [place] [address]    Report Status PENDING  Incomplete    Studies/Results: No results found.  Assessment/Plan: Kyle Banks is a 61 y.o. male with bil great toe infections with gas noted in tissue, and wbc 25 and fever 101 on admit. Sugars were >400, cr elevated (unclear baseline).  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  Per op note on the L the infection extended back to dorsal midfoot and extensive debridement was done.  Cultures are pending but given the extent of infection I think he will need IV abx for several weeks. BXC + GBStrep. Wound cx GB strep and Staph aureus (MSSA) .ESR 107, crp 35, A1c 15.3 6/13- vascular surg consulted, podiatry feels will need further debridement on L foot. 6/15- s/p revasc and repeat surgery  6/18 - R foot doing well. has wound vac in place on L. Still may need further debridement.  Recommendations Continue  zosyn to cover GB strep, MSSA and anaerobes. Will plan on treatment until July 9th but may need longer. - considering [facility] for DC Thank you very much for the consult. Will follow with  you.  Vira Chaplin P   10/01/2016, 1:35 PM

## 2016-10-02 LAB — GLUCOSE, CAPILLARY
GLUCOSE-CAPILLARY: 121 mg/dL — AB (ref 65–99)
GLUCOSE-CAPILLARY: 125 mg/dL — AB (ref 65–99)
GLUCOSE-CAPILLARY: 127 mg/dL — AB (ref 65–99)
Glucose-Capillary: 105 mg/dL — ABNORMAL HIGH (ref 65–99)
Glucose-Capillary: 122 mg/dL — ABNORMAL HIGH (ref 65–99)
Glucose-Capillary: 135 mg/dL — ABNORMAL HIGH (ref 65–99)
Glucose-Capillary: 97 mg/dL (ref 65–99)

## 2016-10-02 NOTE — Plan of Care (Signed)
Problem: Nutrition: Goal: Ability to attain and maintain optimal nutritional status will improve Outcome: Progressing I spoke w/ pt today about eating low carb/high protein snacks. Pt had almonds.

## 2016-10-02 NOTE — Progress Notes (Signed)
Per [facility] liaison patient's [facility] authorization is still pending at this time. Clinical Social Worker (CSW) will continue to follow and assist as needed.   [employer], LCSW [phone]

## 2016-10-02 NOTE — Plan of Care (Signed)
Problem: Pain Managment: Goal: General experience of comfort will improve Outcome: Progressing Pt is tolerating PO pain meds well.

## 2016-10-02 NOTE — Progress Notes (Signed)
[place] [facility] [place] NAME: Kyle Banks    MR#:  [id number]  DATE OF BIRTH:  05/13/1955  CHIEF COMPLAINT:   .  [place] Kitchen   No further abdominal pain. Has had bowel movements. Work with physical therapy and sat in a chair yesterday.  He has wound VAC to his left foot. Family in the room   REVIEW OF SYSTEMS:   ROS   CONSTITUTIONAL: No fever. EYES: No blurred or double vision.  EARS, NOSE, AND THROAT: No tinnitus or ear pain.  RESPIRATORY: No cough, shortness of breath, wheezing or hemoptysis.  CARDIOVASCULAR: No chest pain, orthopnea, edema.  GASTROINTESTINAL: No nausea, vomiting, diarrhea or abdominal pain.  GENITOURINARY: No dysuria, hematuria.  ENDOCRINE: No polyuria, nocturia. HEMATOLOGY: No anemia, easy bruising or bleeding SKIN: No rash or lesion. MUSCULOSKELETAL: Status post amputation. Pain in both feet. PSYCHIATRY: No anxiety or depression.   DRUG ALLERGIES:  No Known Allergies  VITALS:  Blood pressure 137/80, pulse 84, temperature 98.8 F (37.1 C), temperature source Oral, resp. rate 18, height 5\' 11"  (1.803 m), weight 109.3 kg (241 lb), SpO2 92 %.  PHYSICAL EXAMINATION:  GENERAL:  61 y.o.-year-old patient lying in the bed with no acute distress.  EYES: Pupils equal, round, reactive to light and  accommodation. No scleral icterus. Extraocular muscles intact.  HEENT: Head atraumatic, normocephalic. Oropharynx and nasopharynx clear.  NECK:  Supple, no jugular venous distention. No thyroid enlargement, no tenderness.  LUNGS: Normal breath sounds bilaterally, no wheezing, rales,rhonchi or crepitation. No use of accessory muscles of respiration.  CARDIOVASCULAR: S1, S2 normal. No murmurs, rubs, or gallops.  ABDOMEN: Soft, nontender, nondistended. Bowel sounds present. No organomegaly or mass.  EXTREMITIES;Dressing present to both feet. NEUROLOGIC: Cranial nerves II through XII are intact. Muscle strength 5/5 in all  extremities. Sensation intact. Gait not checked.  PSYCHIATRIC: The patient is alert and oriented x 3.  SKIN: Dressing present to both feet. Wound VAC in place  LABORATORY PANEL:   CBC  Recent Labs Lab 09/29/16 0404  WBC 16.5*  HGB 9.0*  HCT 26.5*  PLT 304   ------------------------------------------------------------------------------------------------------------------  Chemistries   Recent Labs Lab 09/29/16 0404 09/30/16 0347  NA 136  --   K 3.7  --   CL 103  --   CO2 26  --   GLUCOSE 143*  --   BUN 12  --   CREATININE 0.95 0.83  CALCIUM 7.7*  --    ------------------------------------------------------------------------------------------------------------------  Cardiac Enzymes No results for input(s): TROPONINI in the last 168 hours. ------------------------------------------------------------------------------------------------------------------  RADIOLOGY:  No results found.  EKG:   Orders placed or performed during the hospital encounter of 09/23/16  . ED EKG  . 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 Treated with broad-spectrum antibiotics initially. Now changed to Zosyn to cover for group B streptococcus, MSSA and anaerobes. Appreciate infectious disease input. Left foot debridement done on 09/28/2016. Wound VAC placed. Discussed with Podiatry Dr cline. Patient may end up needing a below-knee amputation on the left leg. Will be reassessed again today. Will need 4 weeks of IV antibiotics. PICC line placed  #. Diabetes mellitus type 2 uncontrolled HbA1c at 15  Patient has no PCP. Blood sugars were ranging at 400s.  On Lantus 30 units daily, NovoLog 5 units 3 times a  day  blood sugar better controlled but was blood sugar has been 80 9 in the morning. We will decrease his Lantus to 26 units daily.  #.  Pseudo-Hyponatremia due to hyperglycemia resolved  # Metabolic encephalopathy secondary to sepsis.resolved  # Hypertriglyceridemia Likely from his uncontrolled diabetes. Started on [employer] this admission.  All the records are reviewed and case discussed with Care Management/Social Worker. Management plans discussed with the patient, family and they are in agreement.  CODE STATUS: FULL CODE  TOTAL TIME TAKING CARE OF THIS PATIENT: 71 minutes  Warrene Kapfer M.D on 10/02/2016 at 11:02 AM  Between 7am to 6pm - Pager - [phone]  After 6pm go to [email or web address] - password [email or web address] [facility]  Office  [phone]  CC: Primary care physician; Patient, No Pcp Per  Note: This dictation was prepared with Dragon dictation along with smaller phrase technology. Any transcriptional errors that result from this process are unintentional.

## 2016-10-02 NOTE — Progress Notes (Signed)
1022: DM coordinator called. pts BS this AM was 127, 8 units of novolog given per orders. I called DM coordinator prior to giving his scheduled lantus dose (26 units). She reviewed his chart and says its ok to give lantus. Lantus given.

## 2016-10-02 NOTE — Progress Notes (Signed)
Physical Therapy Treatment Patient Details Name: Kyle Banks MRN: [id number] DOB: [birth year] Today's Date: 10/02/2016    History of Present Illness Kyle Banks is a 61 y.o. male with no known medical history presents to the emergency department for evaluation of foot wounds. Patient was in a usual state of health until 3 days ago when he describes the sudden onset of redness, pain, swelling and discharge from his feet bilaterally. He states prior to this infection his feet appeared normal. Patient is unaware of any medical problems such as diabetes and states that he has had annual physicals and blood work at various urgent care centers. Patient denies fevers/chills, weakness, dizziness, chest pain, shortness of breath, N/V/C/D, abdominal pain, dysuria/frequency, changes in mental status. Otherwise, there has been no change in status. Patient has been taking medication as prescribed and there has been no recent change in medication or diet.  No recent antibiotics.  There has been no recent illness, hospitalizations, travel or sick contacts. 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 Attempted to peform PT evaluation on 09/24/16 however pt with myoclonic jerks, dizziness, and facial droop when attempting ambulation. Head CT negative for acute infarct. Pt is now s/p I&D on 6/15. Received continue upon transfer order. Pt is now NWB on L LE.    PT Comments    Pt ready for session this am.  Participated in exercises as described below.  Pt stating he has been transferring with +1 assist with nursing.  To edge of bed with HOB raised and rails without assist.  Attempted standing with walker at bedside with +1 assist but he was unable to safely stand with heavy lean to right side.  2nd assist called and he was then safely able to stand and transfer NWB L to recliner at bedside.  Pt reported general fatigue after activity.      Follow Up Recommendations  SNF     Equipment Recommendations  Rolling walker with 5" wheels    Recommendations for Other Services       Precautions / Restrictions Precautions Precautions: Fall Restrictions Weight Bearing Restrictions: Yes LLE Weight Bearing: Non weight bearing    Mobility  Bed Mobility Overal bed mobility: Modified Independent Bed Mobility: Supine to Sit     Supine to sit: Supervision     General bed mobility comments: uses rails and HOB raised  Transfers Overall transfer level: Needs assistance Equipment used: Rolling walker (2 wheeled) Transfers: Sit to/from Stand Sit to Stand: Mod assist;+2 physical assistance;+2 safety/equipment         General transfer comment: Stated he has been transfering with +1 assis with nursing and walker but was unable to safely stand this session and 2nd assist was called.  Ambulation/Gait Ambulation/Gait assistance: Mod assist;+2 physical assistance Ambulation Distance (Feet): 2 Feet Assistive device: Rolling walker (2 wheeled)     Gait velocity interpretation: Below normal speed for age/gender General Gait Details: Pivoted to RW with NWB LLE.  Small hopping steps to chair.   Stairs            Wheelchair Mobility    Modified Rankin (Stroke Patients Only)       Balance Overall balance assessment: Needs assistance Sitting-balance support: No upper extremity supported Sitting balance-Leahy Scale: Good     Standing balance support: Bilateral upper extremity supported Standing balance-Leahy Scale: Fair  Cognition Arousal/Alertness: Awake/alert Behavior During Therapy: WFL for tasks assessed/performed Overall Cognitive Status: Within Functional Limits for tasks assessed                                        Exercises General Exercises - Lower Extremity Ankle Circles/Pumps: AAROM;Left;10 reps;Supine Quad Sets: Strengthening;Both;10  reps;Supine Gluteal Sets: Strengthening;Both;10 reps;Supine Short Arc Quad: AROM;Both;10 reps;Supine Heel Slides: AROM;Both;10 reps;Supine Hip ABduction/ADduction: AROM;Both;Supine;Other reps (comment) Straight Leg Raises: Strengthening;Both;10 reps;Supine    General Comments        Pertinent Vitals/Pain Pain Assessment: 0-10 Pain Score: 4  Pain Location: L foot Pain Descriptors / Indicators: Operative site guarding;Sore Pain Intervention(s): Limited activity within patient's tolerance    Home Living                      Prior Function            PT Goals (current goals can now be found in the care plan section) Progress towards PT goals: Progressing toward goals    Frequency    7X/week      PT Plan Current plan remains appropriate    Co-evaluation              AM-PAC PT "6 Clicks" Daily Activity  Outcome Measure  Difficulty turning over in bed (including adjusting bedclothes, sheets and blankets)?: A Little Difficulty moving from lying on back to sitting on the side of the bed? : A Little Difficulty sitting down on and standing up from a chair with arms (e.g., wheelchair, bedside commode, etc,.)?: Total Help needed moving to and from a bed to chair (including a wheelchair)?: A Lot Help needed walking in hospital room?: A Lot Help needed climbing 3-5 steps with a railing? : Total 6 Click Score: 12    End of Session Equipment Utilized During Treatment: Gait belt Activity Tolerance: Patient tolerated treatment well Patient left: in chair;with call bell/phone within reach;with chair alarm set         Time: 1002-1026 PT Time Calculation (min) (ACUTE ONLY): 24 min  Charges:  $Therapeutic Exercise: 8-22 mins $Therapeutic Activity: 8-22 mins                    G Codes:      Chesley Noon, PTA 10/02/16, 10:46 AM

## 2016-10-03 ENCOUNTER — Encounter
Admission: EM | Disposition: A | Discharge status: Skilled Nursing Facility | Payer: Self-pay | Source: Home / Self Care | Attending: Internal Medicine

## 2016-10-03 ENCOUNTER — Inpatient Hospital Stay: Payer: 59 | Admitting: Anesthesiology

## 2016-10-03 ENCOUNTER — Encounter: Payer: Self-pay | Admitting: Anesthesiology

## 2016-10-03 HISTORY — PX: AMPUTATION TOE: SHX6595

## 2016-10-03 HISTORY — PX: IRRIGATION AND DEBRIDEMENT FOOT: SHX6602

## 2016-10-03 LAB — GLUCOSE, CAPILLARY
GLUCOSE-CAPILLARY: 111 mg/dL — AB (ref 65–99)
GLUCOSE-CAPILLARY: 130 mg/dL — AB (ref 65–99)
Glucose-Capillary: 167 mg/dL — ABNORMAL HIGH (ref 65–99)
Glucose-Capillary: 89 mg/dL (ref 65–99)
Glucose-Capillary: 73 mg/dL (ref 65–99)
Glucose-Capillary: 52 mg/dL — ABNORMAL LOW (ref 65–99)
Glucose-Capillary: 91 mg/dL (ref 65–99)

## 2016-10-03 LAB — CBC WITH DIFFERENTIAL/PLATELET
Basophils Absolute: 0.1 10*3/uL (ref 0–0.1)
Basophils Relative: 1 %
Eosinophils Absolute: 0.1 10*3/uL (ref 0–0.7)
Eosinophils Relative: 1 %
HEMATOCRIT: 26.1 % — AB (ref 40.0–52.0)
HEMOGLOBIN: 8.9 g/dL — AB (ref 13.0–18.0)
LYMPHS ABS: 2.2 10*3/uL (ref 1.0–3.6)
LYMPHS PCT: 21 %
MCH: 29 pg (ref 26.0–34.0)
MCHC: 34.3 g/dL (ref 32.0–36.0)
MCV: 84.7 fL (ref 80.0–100.0)
MONOS PCT: 11 %
Monocytes Absolute: 1.1 10*3/uL — ABNORMAL HIGH (ref 0.2–1.0)
NEUTROS ABS: 7 10*3/uL — AB (ref 1.4–6.5)
NEUTROS PCT: 66 %
Platelets: 492 10*3/uL — ABNORMAL HIGH (ref 150–440)
RBC: 3.08 MIL/uL — ABNORMAL LOW (ref 4.40–5.90)
RDW: 14.6 % — ABNORMAL HIGH (ref 11.5–14.5)
WBC: 10.5 10*3/uL (ref 3.8–10.6)

## 2016-10-03 SURGERY — IRRIGATION AND DEBRIDEMENT FOOT
Anesthesia: General | Site: Foot | Laterality: Left | Wound class: Dirty or Infected

## 2016-10-03 MED ORDER — PROPOFOL 10 MG/ML IV BOLUS
INTRAVENOUS | Status: AC
  Filled 2016-10-03: qty 40

## 2016-10-03 MED ORDER — VANCOMYCIN HCL 1000 MG IV SOLR
INTRAVENOUS | Status: DC | PRN
  Administered 2016-10-03: 2 g

## 2016-10-03 MED ORDER — ONDANSETRON HCL 4 MG/2ML IJ SOLN
INTRAMUSCULAR | Status: AC
  Filled 2016-10-03: qty 2

## 2016-10-03 MED ORDER — DEXTROSE 50 % IV SOLN
INTRAVENOUS | Status: AC
  Filled 2016-10-03: qty 50

## 2016-10-03 MED ORDER — BUPIVACAINE HCL (PF) 0.5 % IJ SOLN
INTRAMUSCULAR | Status: AC
  Filled 2016-10-03: qty 30

## 2016-10-03 MED ORDER — FENTANYL CITRATE (PF) 100 MCG/2ML IJ SOLN: 25.0000 ug | INTRAMUSCULAR | Status: DC | PRN

## 2016-10-03 MED ORDER — PREMIER PROTEIN SHAKE
11.0000 [oz_av] | ORAL | Status: DC
  Administered 2016-10-04 – 2016-10-09 (×6): 11 [oz_av] via ORAL

## 2016-10-03 MED ORDER — MIDAZOLAM HCL 2 MG/2ML IJ SOLN
INTRAMUSCULAR | Status: AC
  Filled 2016-10-03: qty 2

## 2016-10-03 MED ORDER — JUVEN PO PACK
1.0000 | PACK | Freq: Two times a day (BID) | ORAL | Status: DC
  Administered 2016-10-04 – 2016-10-10 (×7): 1 via ORAL

## 2016-10-03 MED ORDER — VANCOMYCIN HCL 1000 MG IV SOLR
INTRAVENOUS | Status: AC
  Filled 2016-10-03: qty 2000

## 2016-10-03 MED ORDER — FENTANYL CITRATE (PF) 100 MCG/2ML IJ SOLN
INTRAMUSCULAR | Status: AC
  Filled 2016-10-03: qty 2

## 2016-10-03 MED ORDER — ONDANSETRON HCL 4 MG/2ML IJ SOLN: 4.0000 mg | Freq: Once | INTRAMUSCULAR | Status: DC | PRN

## 2016-10-03 MED ORDER — GLYCOPYRROLATE 0.2 MG/ML IJ SOLN
INTRAMUSCULAR | Status: DC | PRN
  Administered 2016-10-03: 0.4 mg via INTRAVENOUS

## 2016-10-03 MED ORDER — NEOMYCIN-POLYMYXIN B GU 40-200000 IR SOLN
Status: AC
  Filled 2016-10-03: qty 2

## 2016-10-03 MED ORDER — GENTAMICIN SULFATE 40 MG/ML IJ SOLN
INTRAMUSCULAR | Status: AC
  Filled 2016-10-03: qty 2

## 2016-10-03 MED ORDER — MORPHINE SULFATE (PF) 4 MG/ML IV SOLN
4.0000 mg | INTRAVENOUS | Status: DC | PRN
  Administered 2016-10-06: 4 mg via INTRAVENOUS
  Filled 2016-10-03: qty 1

## 2016-10-03 MED ORDER — OXYCODONE-ACETAMINOPHEN 5-325 MG PO TABS
1.0000 | ORAL_TABLET | ORAL | Status: DC | PRN
  Administered 2016-10-03: 1 via ORAL
  Administered 2016-10-04 (×2): 2 via ORAL
  Administered 2016-10-04 – 2016-10-05 (×3): 1 via ORAL
  Administered 2016-10-05: 2 via ORAL
  Administered 2016-10-05 – 2016-10-06 (×2): 1 via ORAL
  Administered 2016-10-06 – 2016-10-07 (×4): 2 via ORAL
  Administered 2016-10-07: 1 via ORAL
  Administered 2016-10-08 – 2016-10-10 (×5): 2 via ORAL
  Filled 2016-10-03 (×2): qty 2
  Filled 2016-10-03: qty 1
  Filled 2016-10-03 (×3): qty 2
  Filled 2016-10-03: qty 1
  Filled 2016-10-03 (×6): qty 2
  Filled 2016-10-03 (×4): qty 1
  Filled 2016-10-03 (×2): qty 2

## 2016-10-03 MED ORDER — MIDAZOLAM HCL 2 MG/2ML IJ SOLN
INTRAMUSCULAR | Status: DC | PRN
  Administered 2016-10-03: 1 mg via INTRAVENOUS

## 2016-10-03 MED ORDER — DEXTROSE 50 % IV SOLN
12.5000 g | Freq: Once | INTRAVENOUS | Status: AC
  Administered 2016-10-03: 12.5 g via INTRAVENOUS

## 2016-10-03 MED ORDER — ONDANSETRON HCL 4 MG/2ML IJ SOLN
INTRAMUSCULAR | Status: DC | PRN
  Administered 2016-10-03: 4 mg via INTRAVENOUS

## 2016-10-03 MED ORDER — SODIUM CHLORIDE 0.9 % IR SOLN
Status: DC | PRN
  Administered 2016-10-03: 3000 mL

## 2016-10-03 MED ORDER — FENTANYL CITRATE (PF) 100 MCG/2ML IJ SOLN
INTRAMUSCULAR | Status: DC | PRN
  Administered 2016-10-03: 25 ug via INTRAVENOUS
  Administered 2016-10-03: 50 ug via INTRAVENOUS
  Administered 2016-10-03: 25 ug via INTRAVENOUS

## 2016-10-03 MED ORDER — LIDOCAINE HCL (CARDIAC) 20 MG/ML IV SOLN
INTRAVENOUS | Status: DC | PRN
  Administered 2016-10-03: 30 mg via INTRAVENOUS

## 2016-10-03 MED ORDER — PROPOFOL 10 MG/ML IV BOLUS
INTRAVENOUS | Status: DC | PRN
  Administered 2016-10-03: 60 mg via INTRAVENOUS

## 2016-10-03 MED ORDER — GLYCOPYRROLATE 0.2 MG/ML IJ SOLN
INTRAMUSCULAR | Status: AC
  Filled 2016-10-03: qty 1

## 2016-10-03 MED ORDER — LACTATED RINGERS IV SOLN
INTRAVENOUS | Status: DC | PRN
  Administered 2016-10-03: 20:00:00 via INTRAVENOUS

## 2016-10-03 MED ORDER — BUPIVACAINE HCL (PF) 0.5 % IJ SOLN
INTRAMUSCULAR | Status: DC | PRN
  Administered 2016-10-03: 20 mL

## 2016-10-03 MED ORDER — LIDOCAINE HCL (PF) 1 % IJ SOLN
INTRAMUSCULAR | Status: AC
  Filled 2016-10-03: qty 30

## 2016-10-03 MED ORDER — DEXTROSE-NACL 5-0.9 % IV SOLN
INTRAVENOUS | Status: DC
  Administered 2016-10-03: 22:00:00 via INTRAVENOUS

## 2016-10-03 SURGICAL SUPPLY — 50 items
BANDAGE ACE 4X5 VEL STRL LF (GAUZE/BANDAGES/DRESSINGS) ×4 IMPLANT
BANDAGE ELASTIC 4 LF NS (GAUZE/BANDAGES/DRESSINGS) ×4 IMPLANT
BLADE OSCILLATING/SAGITTAL (BLADE) ×2
BLADE SURG 15 STRL LF DISP TIS (BLADE) ×2 IMPLANT
BLADE SURG 15 STRL SS (BLADE) ×2
BLADE SW THK.38XMED LNG THN (BLADE) ×2 IMPLANT
BNDG ESMARK 4X12 TAN STRL LF (GAUZE/BANDAGES/DRESSINGS) ×4 IMPLANT
BNDG GAUZE 4.5X4.1 6PLY STRL (MISCELLANEOUS) ×8 IMPLANT
CANISTER SUCT 1200ML W/VALVE (MISCELLANEOUS) ×4 IMPLANT
CUFF TOURN 18 STER (MISCELLANEOUS) ×4 IMPLANT
CUFF TOURN DUAL PL 12 NO SLV (MISCELLANEOUS) IMPLANT
DRAPE FLUOR MINI C-ARM 54X84 (DRAPES) IMPLANT
DRSG MEPITEL 4X7.2 (GAUZE/BANDAGES/DRESSINGS) ×4 IMPLANT
DURAPREP 26ML APPLICATOR (WOUND CARE) ×4 IMPLANT
ELECT REM PT RETURN 9FT ADLT (ELECTROSURGICAL) ×4
ELECTRODE REM PT RTRN 9FT ADLT (ELECTROSURGICAL) ×2 IMPLANT
GAUZE FLUFF 18X24 1PLY STRL (GAUZE/BANDAGES/DRESSINGS) ×4 IMPLANT
GAUZE PETRO XEROFOAM 1X8 (MISCELLANEOUS) ×8 IMPLANT
GAUZE SPONGE 4X4 12PLY STRL (GAUZE/BANDAGES/DRESSINGS) ×4 IMPLANT
GAUZE STRETCH 2X75IN STRL (MISCELLANEOUS) IMPLANT
GLOVE BIO SURGEON STRL SZ7.5 (GLOVE) ×12 IMPLANT
GLOVE INDICATOR 8.0 STRL GRN (GLOVE) ×8 IMPLANT
GOWN STRL REUS W/ TWL LRG LVL3 (GOWN DISPOSABLE) ×4 IMPLANT
GOWN STRL REUS W/TWL LRG LVL3 (GOWN DISPOSABLE) ×4
HANDPIECE VERSAJET DEBRIDEMENT (MISCELLANEOUS) ×4 IMPLANT
KIT STIMULAN RAPID CURE 5CC (Orthopedic Implant) ×8 IMPLANT
LABEL OR SOLS (LABEL) ×4 IMPLANT
NEEDLE FILTER BLUNT 18X 1/2SAF (NEEDLE)
NEEDLE FILTER BLUNT 18X1 1/2 (NEEDLE) IMPLANT
NEEDLE HYPO 25X1 1.5 SAFETY (NEEDLE) ×12 IMPLANT
NS IRRIG 500ML POUR BTL (IV SOLUTION) ×4 IMPLANT
PACK EXTREMITY ARMC (MISCELLANEOUS) ×4 IMPLANT
PAD ABD DERMACEA PRESS 5X9 (GAUZE/BANDAGES/DRESSINGS) ×8 IMPLANT
PENCIL ELECTRO HAND CTR (MISCELLANEOUS) ×4 IMPLANT
PULSAVAC PLUS IRRIG FAN TIP (DISPOSABLE) ×4
RASP SM TEAR CROSS CUT (RASP) IMPLANT
RELOAD STAPLE SKIN SM 35W (MISCELLANEOUS) ×4 IMPLANT
SOL PREP PVP 2OZ (MISCELLANEOUS) ×4
SOLUTION PREP PVP 2OZ (MISCELLANEOUS) ×2 IMPLANT
STOCKINETTE STRL 4IN 9604848 (GAUZE/BANDAGES/DRESSINGS) IMPLANT
STOCKINETTE STRL 6IN 960660 (GAUZE/BANDAGES/DRESSINGS) ×4 IMPLANT
SUT ETHILON 4-0 (SUTURE) ×4
SUT ETHILON 4-0 FS2 18XMFL BLK (SUTURE) ×4
SUT VIC AB 3-0 SH 27 (SUTURE)
SUT VIC AB 3-0 SH 27X BRD (SUTURE) IMPLANT
SUT VIC AB 4-0 FS2 27 (SUTURE) IMPLANT
SUTURE ETHLN 4-0 FS2 18XMF BLK (SUTURE) ×4 IMPLANT
SYR 3ML LL SCALE MARK (SYRINGE) ×4 IMPLANT
SYRINGE 10CC LL (SYRINGE) ×8 IMPLANT
TIP FAN IRRIG PULSAVAC PLUS (DISPOSABLE) ×2 IMPLANT

## 2016-10-03 NOTE — Progress Notes (Signed)
CH made a follow up visit with pt. Pt was sitting in a chair at the time of this visit. Pt was in good spirit and talked about his health, his job, his hobbies, children and grandchildren, and his friends. Pt stated that he was very pleased by the treatment he has received at the hospital and that he was waiting to hear the dc's report on what he plans to do to treat his left leg which is still not healing well. Pt told [place] that his family, friend and the health providers here at the hospital have helped him cope with his health challenges, of which he is very grateful. CH offered prayers for pt and his family, and for health providers. CH to make follow up with this pt during his stay in this unit.    Marland Kitchen

## 2016-10-03 NOTE — Anesthesia Preprocedure Evaluation (Addendum)
Anesthesia Evaluation  Patient identified by MRN, date of birth, ID band Patient awake    Reviewed: Allergy & Precautions, NPO status , Patient's Chart, lab work & pertinent test results, reviewed documented beta blocker date and time   Airway Mallampati: III  TM Distance: >3 FB     Dental  (+) Chipped   Pulmonary           Cardiovascular      Neuro/Psych    GI/Hepatic   Endo/Other  diabetes, Poorly Controlled, Type 2  Renal/GU      Musculoskeletal   Abdominal   Peds  Hematology   Anesthesia Other Findings Cellulitis.  Reproductive/Obstetrics                            Anesthesia Physical Anesthesia Plan  ASA: III  Anesthesia Plan: General   Post-op Pain Management:    Induction: Intravenous  PONV Risk Score and Plan:   Airway Management Planned: LMA  Additional Equipment:   Intra-op Plan:   Post-operative Plan:   Informed Consent: I have reviewed the patients History and Physical, chart, labs and discussed the procedure including the risks, benefits and alternatives for the proposed anesthesia with the patient or authorized representative who has indicated his/her understanding and acceptance.     Plan Discussed with: CRNA  Anesthesia Plan Comments:         Anesthesia Quick Evaluation

## 2016-10-03 NOTE — Interval H&P Note (Signed)
History and Physical Interval Note:  10/03/2016 6:57 PM  Kyle Banks  has presented today for surgery, with the diagnosis of n/a  The various methods of treatment have been discussed with the patient and family. After consideration of risks, benefits and other options for treatment, the patient has consented to  Procedure(s): IRRIGATION AND DEBRIDEMENT FOOT (Left) as a surgical intervention .  The patient's history has been reviewed, patient examined, no change in status, stable for surgery.  I have reviewed the patient's chart and labs.  Questions were answered to the patient's satisfaction.     Durward Fortes

## 2016-10-03 NOTE — Progress Notes (Signed)
Physical Therapy Treatment Patient Details Name: Kyle Banks MRN: [id number] DOB: 06-May-1955 Today's Date: 10/03/2016    History of Present Illness Kyle Banks is a 62 y.o. male with no known medical history presents to the emergency department for evaluation of foot wounds. Patient was in a usual state of health until 3 days ago when he describes the sudden onset of redness, pain, swelling and discharge from his feet bilaterally. He states prior to this infection his feet appeared normal. Patient is unaware of any medical problems such as diabetes and states that he has had annual physicals and blood work at various urgent care centers. Patient denies fevers/chills, weakness, dizziness, chest pain, shortness of breath, N/V/C/D, abdominal pain, dysuria/frequency, changes in mental status. Otherwise, there has been no change in status. Patient has been taking medication as prescribed and there has been no recent change in medication or diet.  No recent antibiotics.  There has been no recent illness, hospitalizations, travel or sick contacts. 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 Attempted to peform PT evaluation on 09/24/16 however pt with myoclonic jerks, dizziness, and facial droop when attempting ambulation. Head CT negative for acute infarct. Pt is now s/p I&D on 6/15. Received continue upon transfer order. Pt is now NWB on L LE.    PT Comments    Pt progressing well toward goals. Pt demonstrated improved strength this session shown by increased number of there-ex exercises/repetitions this session. Able to sit EOB with supervision while performing LE and UE there-ex with no LOB/UE support. Ambulated from EOB to recliner with RW and +2 min assist for safety. Pt required less physical assist and less verbal cueing to maintain his NWB precautions. Continue to progress pt's there-ex intensity, especially his  pelvic/trunk stability and triceps strength to promote functional movement both seated and standing.   Follow Up Recommendations  SNF     Equipment Recommendations  Rolling walker with 5" wheels    Recommendations for Other Services       Precautions / Restrictions Precautions Precautions: Fall Required Braces or Orthoses:  (B post op shoes) Restrictions Weight Bearing Restrictions: Yes LLE Weight Bearing: Non weight bearing    Mobility  Bed Mobility Overal bed mobility: Modified Independent Bed Mobility: Supine to Sit     Supine to sit: Modified independent (Device/Increase time)     General bed mobility comments: Performed with mod I due to increased time. HOB raised. Able to manage BLE's without cueing for assist.   Transfers Overall transfer level: Needs assistance Equipment used: Rolling walker (2 wheeled) Transfers: Sit to/from Stand Sit to Stand: +2 safety/equipment;Min assist;From elevated surface         General transfer comment: Performed sit to/from stand with +2 min assist for safety. Able to transfer while maintaining NWB on LLE with min verbal cueing this session. Pt steady once standing with no LOB noted.  Ambulation/Gait Ambulation/Gait assistance: Min assist;+2 safety/equipment Ambulation Distance (Feet): 2 Feet Assistive device: Rolling walker (2 wheeled) Gait Pattern/deviations: Trunk flexed (Pivot on R LE)     General Gait Details: Ambulated from EOB to recliner with RW and +2 min assist for safety. Post-op shoes donned B prior to mobilization. Maintained NWB precautions with min verbal cueing. No LOB or unsteadiness noted this session. Min cueing for proper placement of B hands to recliner prior to stand to sit transfer.   Stairs  Wheelchair Mobility    Modified Rankin (Stroke Patients Only)       Balance Overall balance assessment: Needs assistance     Sitting balance - Comments: Seated balance greatly improved. Pt  able to perform LE ther-ex at EOB with no UE support or LOB noted.       Standing balance comment: Pt steady pivoting on R LE this session with B UE's on RW. +2 min assist for safety.                            Cognition Arousal/Alertness: Awake/alert Behavior During Therapy: WFL for tasks assessed/performed Overall Cognitive Status: Within Functional Limits for tasks assessed                                        Exercises Other Exercises Other Exercises: Supine ther-ex x12 B included: quad set, SLR's, hip abd, heel slide and glute squeeze. Pt able to fully straighten L knee this session (pt has been icing this knee due to swelling/pain). Ther-ex performed with supervision and cueing for sequencing. Other Exercises: Seated ther-ex x12 B at EOB included: ankle pumps, LAQ's, marches, UE push/pull against PT resistance, and posterior to anterior leans for core/seated balance work (6" posterior to upright). Performed with supervision. Pt able to maintain seated balance with UE's in lap during all ther-ex without LOB.    General Comments        Pertinent Vitals/Pain Pain Assessment: 0-10 Pain Score: 4  Pain Location: L foot Pain Descriptors / Indicators: Operative site guarding;Sore Pain Intervention(s): Limited activity within patient's tolerance;Monitored during session;Premedicated before session;Repositioned    Home Living                      Prior Function            PT Goals (current goals can now be found in the care plan section) Acute Rehab PT Goals Patient Stated Goal: Return to prior function at home PT Goal Formulation: With patient Time For Goal Achievement: 10/09/16 Potential to Achieve Goals: Good Progress towards PT goals: Progressing toward goals    Frequency    7X/week      PT Plan Current plan remains appropriate    Co-evaluation              AM-PAC PT "6 Clicks" Daily Activity  Outcome Measure   Difficulty turning over in bed (including adjusting bedclothes, sheets and blankets)?: None Difficulty moving from lying on back to sitting on the side of the bed? : A Little Difficulty sitting down on and standing up from a chair with arms (e.g., wheelchair, bedside commode, etc,.)?: Total Help needed moving to and from a bed to chair (including a wheelchair)?: A Little Help needed walking in hospital room?: A Lot Help needed climbing 3-5 steps with a railing? : Total 6 Click Score: 14    End of Session Equipment Utilized During Treatment: Gait belt (B post-op shoes) Activity Tolerance: Patient tolerated treatment well Patient left: in chair;with call bell/phone within reach Nurse Communication: Mobility status PT Visit Diagnosis: Unsteadiness on feet (R26.81);Muscle weakness (generalized) (M62.81)     Time: 2878-6767 PT Time Calculation (min) (ACUTE ONLY): 33 min  Charges:                       G  Codes:       Donaciano Eva, PT, SPT  [place] 10/03/2016, 10:30 AM

## 2016-10-03 NOTE — Progress Notes (Signed)
Kyle Banks at [employer] NAME: Kyle Banks    MR#:  [id number]  DATE OF BIRTH:  May 06, 1955  CHIEF COMPLAINT:    No further abdominal pain. Has had bowel movements. Work with physical therapy and sat in a chair yesterday.  He has wound VAC to his left foot. Family in the room  REVIEW OF SYSTEMS:   ROS   CONSTITUTIONAL: No fever. EYES: No blurred or double vision.  EARS, NOSE, AND THROAT: No tinnitus or ear pain.  RESPIRATORY: No cough, shortness of breath, wheezing or hemoptysis.  CARDIOVASCULAR: No chest pain, orthopnea, edema.  GASTROINTESTINAL: No nausea, vomiting, diarrhea or abdominal pain.  GENITOURINARY: No dysuria, hematuria.  ENDOCRINE: No polyuria, nocturia. HEMATOLOGY: No anemia, easy bruising or bleeding SKIN: No rash or lesion. MUSCULOSKELETAL: Status post amputation. Pain in both feet. PSYCHIATRY: No anxiety or depression.   DRUG ALLERGIES:  No Known Allergies  VITALS:  Blood pressure (!) 146/74, pulse 87, temperature 98.6 F (37 C), temperature source Oral, resp. rate 20, height 5\' 11"  (1.803 m), weight 109.3 kg (241 lb), SpO2 95 %.  PHYSICAL EXAMINATION:  GENERAL:  61 y.o.-year-old patient lying in the bed with no acute distress.  EYES: Pupils equal, round, reactive to light and  accommodation. No scleral icterus. Extraocular muscles intact.  HEENT: Head atraumatic, normocephalic. Oropharynx and nasopharynx clear.  NECK:  Supple, no jugular venous distention. No thyroid enlargement, no tenderness.  LUNGS: Normal breath sounds bilaterally, no wheezing, rales,rhonchi or crepitation. No use of accessory muscles of respiration.  CARDIOVASCULAR: S1, S2 normal. No murmurs, rubs, or gallops.  ABDOMEN: Soft, nontender, nondistended. Bowel sounds present. No organomegaly or mass.  EXTREMITIES;Dressing present to both feet. NEUROLOGIC: Cranial nerves II through XII are intact. Muscle strength 5/5 in all extremities.  Sensation intact. Gait not checked.  PSYCHIATRIC: The patient is alert and oriented x 3.  SKIN: Dressing present to both feet. Wound VAC in place  LABORATORY PANEL:   CBC  Recent Labs Lab 09/29/16 0404  WBC 16.5*  HGB 9.0*  HCT 26.5*  PLT 304   ------------------------------------------------------------------------------------------------------------------  Chemistries   Recent Labs Lab 09/29/16 0404 09/30/16 0347  NA 136  --   K 3.7  --   CL 103  --   CO2 26  --   GLUCOSE 143*  --   BUN 12  --   CREATININE 0.95 0.83  CALCIUM 7.7*  --    ------------------------------------------------------------------------------------------------------------------  Cardiac Enzymes No results for input(s): TROPONINI in the last 168 hours. ------------------------------------------------------------------------------------------------------------------  RADIOLOGY:  No results found.  EKG:   Orders placed or performed during the hospital encounter of 09/23/16  . ED EKG  . 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 Treated with broad-spectrum antibiotics initially. Now changed to Zosyn to cover for group B streptococcus, MSSA and anaerobes. Appreciate infectious disease input. Left foot debridement done on 09/28/2016. Wound VAC placed. Discussed with Podiatry Dr cline. Patient to go for more debridement tonite Will need 4 weeks of IV antibiotics. PICC line placed  #. Diabetes mellitus type 2 uncontrolled HbA1c at 15  Patient has no PCP. Blood sugars were ranging at 400s.  On Lantus 26 units daily, NovoLog 5 units 3 times a day  blood sugar better controlled   #. Pseudo-Hyponatremia due to hyperglycemia resolved  #  Metabolic encephalopathy secondary to sepsis -resolved  # Hypertriglyceridemia Likely from his uncontrolled diabetes.  Started on [employer] this admission.  All the records are reviewed and case discussed with Care Management/Social Worker. Management plans discussed with the patient, family and they are in agreement.  CODE STATUS: FULL CODE  TOTAL TIME TAKING CARE OF THIS PATIENT: 69 minutes  Kyle Banks M.D on 10/03/2016 at 1:46 PM  Between 7am to 6pm - Pager - [phone]  After 6pm go to [email or web address] - password [email or web address]  Office  [phone]  CC: Primary care physician; Patient, No Pcp Per  Note: This dictation was prepared with Dragon dictation along with smaller phrase technology. Any transcriptional errors that result from this process are unintentional.

## 2016-10-03 NOTE — Anesthesia Procedure Notes (Signed)
Procedure Name: LMA Insertion Date/Time: 10/03/2016 8:29 PM Performed by: Lendon Colonel Pre-anesthesia Checklist: Patient identified, Emergency Drugs available, Suction available, Patient being monitored and Timeout performed Patient Re-evaluated:Patient Re-evaluated prior to inductionOxygen Delivery Method: Circle system utilized Preoxygenation: Pre-oxygenation with 100% oxygen Intubation Type: IV induction Ventilation: Mask ventilation without difficulty LMA: LMA inserted LMA Size: 4.5 Tube type: Oral Number of attempts: 1 Placement Confirmation: positive ETCO2 and breath sounds checked- equal and bilateral Tube secured with: Tape Dental Injury: Teeth and Oropharynx as per pre-operative assessment

## 2016-10-03 NOTE — Anesthesia Post-op Follow-up Note (Cosign Needed)
Anesthesia QCDR form completed.        

## 2016-10-03 NOTE — Progress Notes (Signed)
[facility] INFECTIOUS DISEASE PROGRESS NOTE Date of Admission:  09/23/2016     ID: Darrold Bezek is a 61 y.o. male with  Active Problems:   Sepsis due to cellulitis [facility])  Subjective: No fevers, more alert, tolerating abx  ROS  Eleven systems are reviewed and negative except per hpi  Medications:  Antibiotics Given (last 72 hours)    Date/Time Action Medication Dose Rate   09/30/16 2235 New Bag/Given   piperacillin-tazobactam (ZOSYN) IVPB 3.375 g 3.375 g 12.5 mL/hr   10/01/16 0504 New Bag/Given   piperacillin-tazobactam (ZOSYN) IVPB 3.375 g 3.375 g 12.5 mL/hr   10/01/16 1515 New Bag/Given   piperacillin-tazobactam (ZOSYN) IVPB 3.375 g 3.375 g 12.5 mL/hr   10/01/16 2203 New Bag/Given   piperacillin-tazobactam (ZOSYN) IVPB 3.375 g 3.375 g 12.5 mL/hr   10/02/16 0501 New Bag/Given   piperacillin-tazobactam (ZOSYN) IVPB 3.375 g 3.375 g 12.5 mL/hr   10/02/16 1436 New Bag/Given   piperacillin-tazobactam (ZOSYN) IVPB 3.375 g 3.375 g 12.5 mL/hr   10/02/16 2138 New Bag/Given   piperacillin-tazobactam (ZOSYN) IVPB 3.375 g 3.375 g 12.5 mL/hr   10/03/16 0549 New Bag/Given   piperacillin-tazobactam (ZOSYN) IVPB 3.375 g 3.375 g 12.5 mL/hr   10/03/16 1421 New Bag/Given   piperacillin-tazobactam (ZOSYN) IVPB 3.375 g 3.375 g 12.5 mL/hr     . aspirin EC  81 mg Oral Daily  . chlorhexidine  60 mL Topical Once  . enoxaparin (LOVENOX) injection  40 mg Subcutaneous Q24H  . fenofibrate  160 mg Oral Daily  . insulin aspart  0-20 Units Subcutaneous Q4H  . insulin aspart  5 Units Subcutaneous TID WC  . insulin glargine  26 Units Subcutaneous Q24H  . insulin starter kit- pen needles  1 kit Other Once  . [START ON 10/04/2016] nutrition supplement (JUVEN)  1 packet Oral BID BM  . [START ON 10/04/2016] protein supplement shake  11 oz Oral Q24H  . senna-docusate  2 tablet Oral BID  . sodium chloride flush  10-40 mL Intracatheter Q12H  . sodium chloride flush  3 mL Intravenous Q12H     Objective: Vital signs in last 24 hours: Temp:  [98.6 F (37 C)-98.7 F (37.1 C)] 98.6 F (37 C) (06/20 1317) Pulse Rate:  [68-87] 87 (06/20 1317) Resp:  [18-20] 20 (06/20 1317) BP: (140-148)/(70-80) 146/74 (06/20 1317) SpO2:  [95 %-98 %] 95 % (06/20 1317)  Constitutional: He is obese, awake, interactive HENT: anicteric Mouth/Throat: Oropharynx is clear and dry. No oropharyngeal exudate.  Cardiovascular: Tachycardia, regular Pulmonary/Chest: Effort normal and breath sounds normal.  Abdominal: Soft. Obese Bowel sounds are normal. He exhibits no distension. There is no tenderness.  Lymphadenopathy: He has no cervical adenopathy.  Neurological: He is alert and oriented to person, place, and time.  Skin: bil leg wrapped L in wound vac Psychiatric: interactive  Lab Results No results for input(s): WBC, HGB, HCT, NA, K, CL, CO2, BUN, CREATININE, GLU in the last 72 hours.  Invalid input(s): PLATELETS Lab Results  Component Value Date   ESRSEDRATE 107 (H) 09/25/2016   Lab Results  Component Value Date   CRP 35.6 (H) 09/25/2016    Microbiology: Results for orders placed or performed during the hospital encounter of 09/23/16  Blood culture (routine x 2)     Status: Abnormal   Collection Time: 09/23/16  7:44 PM  Result Value Ref Range Status   Specimen Description BLOOD LEFT ANTECUBITAL  Final   Special Requests   Final    BOTTLES DRAWN AEROBIC  AND ANAEROBIC Blood Culture adequate volume   Culture  Setup Time   Final    GRAM POSITIVE COCCI IN BOTH AEROBIC AND ANAEROBIC BOTTLES CRITICAL RESULT CALLED TO, READ BACK BY AND VERIFIED WITH: JASON ROBBINS AT 3762 ON 09/24/2016 JJB    Culture (A)  Final    GROUP B STREP(S.AGALACTIAE)ISOLATED SUSCEPTIBILITIES PERFORMED ON PREVIOUS CULTURE WITHIN THE LAST 5 DAYS.    Report Status 09/27/2016 FINAL  Final  Blood culture (routine x 2)     Status: Abnormal   Collection Time: 09/23/16  7:44 PM  Result Value Ref Range Status    Specimen Description BLOOD RIGHT ANTECUBITAL  Final   Special Requests   Final    BOTTLES DRAWN AEROBIC AND ANAEROBIC Blood Culture adequate volume   Culture  Setup Time   Final    GRAM POSITIVE COCCI IN BOTH AEROBIC AND ANAEROBIC BOTTLES CRITICAL RESULT CALLED TO, READ BACK BY AND VERIFIED WITH: JASON ROBBINS AT 8315 ON 09/24/2016 JJB Performed at [facility], [address] [place], [place] [address]    Culture GROUP B STREP(S.AGALACTIAE)ISOLATED (A)  Final   Report Status 09/27/2016 FINAL  Final   Organism ID, Bacteria GROUP B STREP(S.AGALACTIAE)ISOLATED  Final      Susceptibility   Group b strep(s.agalactiae)isolated - MIC*    CLINDAMYCIN <=0.25 SENSITIVE Sensitive     AMPICILLIN <=0.25 SENSITIVE Sensitive     ERYTHROMYCIN <=0.12 SENSITIVE Sensitive     VANCOMYCIN 0.5 SENSITIVE Sensitive     CEFTRIAXONE <=0.12 SENSITIVE Sensitive     LEVOFLOXACIN 1 SENSITIVE Sensitive     * GROUP B STREP(S.AGALACTIAE)ISOLATED  Blood Culture ID Panel (Reflexed)     Status: Abnormal   Collection Time: 09/23/16  7:44 PM  Result Value Ref Range Status   Enterococcus species NOT DETECTED NOT DETECTED Final   Listeria monocytogenes NOT DETECTED NOT DETECTED Final   Staphylococcus species NOT DETECTED NOT DETECTED Final   Staphylococcus aureus NOT DETECTED NOT DETECTED Final   Streptococcus species DETECTED (A) NOT DETECTED Final    Comment: CRITICAL RESULT CALLED TO, READ BACK BY AND VERIFIED WITH: JASON ROBBINS AT 1608 ON 09/24/2016 JJB    Streptococcus agalactiae DETECTED (A) NOT DETECTED Final    Comment: CRITICAL RESULT CALLED TO, READ BACK BY AND VERIFIED WITH: JASON ROBBINS AT 1608 ON 09/24/2016 JJB    Streptococcus pneumoniae NOT DETECTED NOT DETECTED Final   Streptococcus pyogenes NOT DETECTED NOT DETECTED Final   Acinetobacter baumannii NOT DETECTED NOT DETECTED Final   Enterobacteriaceae species NOT DETECTED NOT DETECTED Final   Enterobacter cloacae complex NOT DETECTED NOT  DETECTED Final   Escherichia coli NOT DETECTED NOT DETECTED Final   Klebsiella oxytoca NOT DETECTED NOT DETECTED Final   Klebsiella pneumoniae NOT DETECTED NOT DETECTED Final   Proteus species NOT DETECTED NOT DETECTED Final   Serratia marcescens NOT DETECTED NOT DETECTED Final   Haemophilus influenzae NOT DETECTED NOT DETECTED Final   Neisseria meningitidis NOT DETECTED NOT DETECTED Final   Pseudomonas aeruginosa NOT DETECTED NOT DETECTED Final   Candida albicans NOT DETECTED NOT DETECTED Final   Candida glabrata NOT DETECTED NOT DETECTED Final   Candida krusei NOT DETECTED NOT DETECTED Final   Candida parapsilosis NOT DETECTED NOT DETECTED Final   Candida tropicalis NOT DETECTED NOT DETECTED Final  Aerobic/Anaerobic Culture (surgical/deep wound)     Status: None   Collection Time: 09/24/16 12:34 AM  Result Value Ref Range Status   Specimen Description WOUND RIGHT GREAT TOE  Final  Special Requests NONE  Final   Gram Stain   Final    RARE WBC PRESENT, PREDOMINANTLY PMN RARE SQUAMOUS EPITHELIAL CELLS PRESENT ABUNDANT GRAM POSITIVE COCCI IN PAIRS MODERATE GRAM NEGATIVE RODS RARE GRAM POSITIVE RODS    Culture   Final    MODERATE GROUP B STREP(S.AGALACTIAE)ISOLATED TESTING AGAINST S. AGALACTIAE NOT ROUTINELY PERFORMED DUE TO PREDICTABILITY OF AMP/PEN/VAN SUSCEPTIBILITY. FEW STAPHYLOCOCCUS AUREUS FEW PREVOTELLA BIVIA BETA LACTAMASE POSITIVE Performed at [facility], [place] [address] [place], [place] [address]    Report Status 09/28/2016 FINAL  Final   Organism ID, Bacteria STAPHYLOCOCCUS AUREUS  Final      Susceptibility   Staphylococcus aureus - MIC*    CIPROFLOXACIN <=0.5 SENSITIVE Sensitive     ERYTHROMYCIN >=8 RESISTANT Resistant     GENTAMICIN <=0.5 SENSITIVE Sensitive     OXACILLIN <=0.25 SENSITIVE Sensitive     TETRACYCLINE <=1 SENSITIVE Sensitive     VANCOMYCIN <=0.5 SENSITIVE Sensitive     TRIMETH/SULFA <=10 SENSITIVE Sensitive     CLINDAMYCIN RESISTANT  Resistant     RIFAMPIN <=0.5 SENSITIVE Sensitive     Inducible Clindamycin POSITIVE Resistant     * FEW STAPHYLOCOCCUS AUREUS  Aerobic/Anaerobic Culture (surgical/deep wound)     Status: None   Collection Time: 09/24/16 12:35 AM  Result Value Ref Range Status   Specimen Description WOUND LEFT GREAT TOE  Final   Special Requests NONE  Final   Gram Stain   Final    FEW WBC PRESENT, PREDOMINANTLY PMN ABUNDANT GRAM NEGATIVE RODS IN PAIRS ABUNDANT GRAM NEGATIVE RODS FEW GRAM POSITIVE RODS    Culture   Final    ABUNDANT GROUP B STREP(S.AGALACTIAE)ISOLATED TESTING AGAINST S. AGALACTIAE NOT ROUTINELY PERFORMED DUE TO PREDICTABILITY OF AMP/PEN/VAN SUSCEPTIBILITY. ABUNDANT PREVOTELLA BIVIA BETA LACTAMASE POSITIVE Performed at [facility], [place] [address], [place], [place] [address]    Report Status 09/28/2016 FINAL  Final  CULTURE, BLOOD (ROUTINE X 2) w Reflex to ID Panel     Status: None   Collection Time: 09/26/16  7:14 PM  Result Value Ref Range Status   Specimen Description BLOOD BLOOD RIGHT HAND  Final   Special Requests   Final    BOTTLES DRAWN AEROBIC AND ANAEROBIC Blood Culture results may not be optimal due to an excessive volume of blood received in culture bottles   Culture NO GROWTH 5 DAYS  Final   Report Status 10/01/2016 FINAL  Final  CULTURE, BLOOD (ROUTINE X 2) w Reflex to ID Panel     Status: None   Collection Time: 09/26/16  7:22 PM  Result Value Ref Range Status   Specimen Description BLOOD [facility]  Final   Special Requests   Final    BOTTLES DRAWN AEROBIC AND ANAEROBIC Blood Culture results may not be optimal due to an excessive volume of blood received in culture bottles   Culture NO GROWTH 5 DAYS  Final   Report Status 10/01/2016 FINAL  Final  MRSA PCR Screening     Status: None   Collection Time: 09/27/16  5:07 PM  Result Value Ref Range Status   MRSA by PCR NEGATIVE NEGATIVE Final    Comment:        The GeneXpert MRSA Assay (FDA approved for NASAL  specimens only), is one component of a comprehensive MRSA colonization surveillance program. It is not intended to diagnose MRSA infection nor to guide or monitor treatment for MRSA infections.   Aerobic/Anaerobic Culture (surgical/deep wound)  Status: None (Preliminary result)   Collection Time: 09/28/16  4:22 PM  Result Value Ref Range Status   Specimen Description ABSCESS  Final   Special Requests NONE  Final   Gram Stain   Final    MODERATE WBC PRESENT, PREDOMINANTLY PMN RARE SQUAMOUS EPITHELIAL CELLS PRESENT NO ORGANISMS SEEN Performed at [facility], [address], [place], [place] [address]    Culture   Final    RARE STREPTOCOCCUS ANGINOSIS RARE ENTEROCOCCUS FAECALIS NO ANAEROBES ISOLATED; CULTURE IN PROGRESS FOR 5 DAYS    Report Status PENDING  Incomplete   Organism ID, Bacteria ENTEROCOCCUS FAECALIS  Final      Susceptibility   Enterococcus faecalis - MIC*    AMPICILLIN <=2 SENSITIVE Sensitive     VANCOMYCIN 1 SENSITIVE Sensitive     GENTAMICIN SYNERGY RESISTANT Resistant     * RARE ENTEROCOCCUS FAECALIS    Studies/Results: No results found.  Assessment/Plan: Pankaj Haack is a 61 y.o. male with bil great toe infections with gas noted in tissue, and wbc 25 and fever 101 on admit. Sugars were >400, cr elevated (unclear baseline).  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  Per op note on the L the infection extended back to dorsal midfoot and extensive debridement was done.  Cultures are pending but given the extent of infection I think he will need IV abx for several weeks. BXC + GBStrep. Wound cx GB strep and Staph aureus (MSSA) .ESR 107, crp 35, A1c 15.3 6/13- vascular surg consulted, podiatry feels will need further debridement on L foot. 6/15- s/p revasc and repeat surgery  6/18 - R foot doing well. has wound vac in place on L. Still may need further debridement.  June 20 - no fevers, for repeat debridement of l foot Recommendations zosyn to  cover GB strep, MSSA and anaerobes. Will plan on treatment until July 9th but may need longer. - considering [facility] for DC - see abx order sheet from 6/15 Thank you very much for the consult. Will follow with you.  Aneta Hendershott P   10/03/2016, 3:19 PM

## 2016-10-03 NOTE — Progress Notes (Signed)
Initial Nutrition Assessment  DOCUMENTATION CODES:   Obesity unspecified  INTERVENTION:  Encouraged adequate intake of protein at meals and snacks to promote healing. Will leave note on [employer] that patient can have double portions as requested.  Reviewed "List of Food High in Protein" handout from the Academy of [employer] Dietetics. Reviewed patient's protein goals and how he can meet that with food.   Provide Premier Protein po once daily, each supplement provides 160 kcal and 30 grams of protein.  Provide Juven po BID to promote wound healing, each supplement provides 80 kcal, 14 grams of amino acids. Each packet of Juven also provides 300 mg of Vitamin C, 9.5 mg of zinc, and other vitamins and minerals important in wound healing.  NUTRITION DIAGNOSIS:   Increased nutrient needs related to wound healing as evidenced by estimated needs.  GOAL:   Patient will meet greater than or equal to 90% of their needs   MONITOR:   PO intake, Supplement acceptance, Labs, Weight trends, Skin, I & O's  REASON FOR ASSESSMENT:   LOS Diet education  ASSESSMENT:   61 year old male with no known medical history presented for evaluation of food wounds and was found to have sepsis secondary to cellulitis/osteomyelitis of bilateral great toes, new onset diabetes.    -Pt s/p amputations of left and right great toes on 6/11. -Pt s/p angiogram with intervention on 09/26/2016. -Pt s/p I&D on 6/15 and placement of wound VAC on left foot. -Per chart plan is for first ray resection and re-debridement of left foot today. At risk for needing a BKA.  Patient seen one week ago for diabetes education. Spoke with patient and family members at bedside. Patient reports his appetite is good and he is eating 100% of meals, but it is just not as much food as he typically eats at home. Patient does report he has been eating snacks including almonds, peanuts, and graham crackers, but feels like it is not  enough. He is requesting education on obtaining adequate protein for wound healing.   Patient reports he has not been losing weight. Only weight in chart that looks like a true measured weight is 218.6 lbs on 09/24/2016. Unable to weigh patient to trend as he was sitting on chair.  Meal Completion: 100% In the past 24 hours patient has had approximately 1268 kcal (60% minimum estimated kcal needs) and 69 grams of protein (63% minimum estimated protein needs).  Medications reviewed and include: fenofibrate 160 mg daily, Novolog 0-20 units Q4hrs, Novolog 5 units TID, Lantus 26 units Q24hrs, senna, NS @ 100 ml/hr, Zosyn.  Labs reviewed: CBG 97-167 past 24 hrs. Triglycerides were 431 on 6/12. HgbA1c 15.1 on 09/25/2016.  Nutrition-Focused physical exam completed. Findings are no fat depletion, no muscle depletion, and mild edema.   Diet Order:  Diet NPO time specified  Skin:  Wound (see comment) (s/p amputations b/l great toes, closed incisions b/l feet)  Last BM:  10/02/2016 - type 6  Height:   Ht Readings from Last 1 Encounters:  09/26/16 5\' 11"  (1.803 m)    Weight:   Wt Readings from Last 1 Encounters:  10/02/16 241 lb (109.3 kg)    Ideal Body Weight:  78.2 kg  BMI:  Body mass index is 33.61 kg/m.  Estimated Nutritional Needs:   Kcal:  2115-2310 (MSJ x 1.1-1.2)  Protein:  110-130 grams (1-1.2 grams/kg)  Fluid:  2.3 L/day (30 ml/kg IBW)  EDUCATION NEEDS:   Education needs addressed  Kyle Banks  Minette Brine, MS, RD, LDN Pager: [phone] After Hours Pager: [phone]

## 2016-10-03 NOTE — Progress Notes (Signed)
5 Days Post-Op   Subjective/Chief Complaint: Patient seen. States that overall he is feeling better.   Objective: Vital signs in last 24 hours: Temp:  [98.4 F (36.9 C)-98.7 F (37.1 C)] 98.7 F (37.1 C) (06/20 0800) Pulse Rate:  [68-81] 81 (06/20 0800) Resp:  [18-20] 20 (06/20 0800) BP: (132-148)/(70-80) 148/80 (06/20 0800) SpO2:  [93 %-98 %] 95 % (06/20 0800) Last BM Date: 10/02/16  Intake/Output from previous day: 06/19 0701 - 06/20 0700 In: 1330 [P.O.:1180; IV Piggyback:150] Out: 500 [Urine:500] Intake/Output this shift: Total I/O In: 480 [P.O.:480] Out: -   Moderate drainage on the bandaging. Upon removal there is still some purulence from the incision along the medial aspect of the rear foot and ankle with some underlying mild necrosis. Exposed bone is still present along the entire first metatarsal on the left foot. Some necrosis is noted along the inferior and distal aspect of the open wound around the first metatarsal with some purulence expressed from beneath the skin bridge dorsally.  Lab Results:  No results for input(s): WBC, HGB, HCT, PLT in the last 72 hours. BMET No results for input(s): NA, K, CL, CO2, GLUCOSE, BUN, CREATININE, CALCIUM in the last 72 hours. PT/INR No results for input(s): LABPROT, INR in the last 72 hours. ABG No results for input(s): PHART, HCO3 in the last 72 hours.  Invalid input(s): PCO2, PO2  Studies/Results: No results found.  Anti-infectives: Anti-infectives    Start     Dose/Rate Route Frequency Ordered Stop   09/28/16 1500  piperacillin-tazobactam (ZOSYN) IVPB 3.375 g     3.375 g 12.5 mL/hr over 240 Minutes Intravenous Every 8 hours 09/28/16 1415     09/28/16 1400  ceftAZIDime (FORTAZ) 2 gram/50 mL D5W IVPB - DUPLEX  Status:  Discontinued     2 g 100 mL/hr over 30 Minutes Intravenous Every 8 hours 09/28/16 0813 09/28/16 1408   09/28/16 0600  cefTAZidime (FORTAZ) 2 g in dextrose 5 % 50 mL IVPB  Status:  Discontinued     2  g 100 mL/hr over 30 Minutes Intravenous Every 8 hours 09/27/16 1621 09/28/16 0813   09/26/16 2200  ceftAZIDime (FORTAZ) 2 gram/50 mL D5W IVPB - DUPLEX  Status:  Discontinued     2 g 100 mL/hr over 30 Minutes Intravenous Every 8 hours 09/26/16 0802 09/27/16 1620   09/25/16 2200  ceftAZIDime (FORTAZ) 2 gram/50 mL D5W IVPB - DUPLEX  Status:  Discontinued     2 g 100 mL/hr over 30 Minutes Intravenous Every 8 hours 09/25/16 0828 09/25/16 1950   09/25/16 2200  ceftAZIDime (FORTAZ) 2 gram/50 mL D5W IVPB - DUPLEX  Status:  Discontinued     2 g 100 mL/hr over 30 Minutes Intravenous Every 8 hours 09/25/16 1951 09/25/16 1951   09/25/16 2200  cefTAZidime (FORTAZ) 2 g in dextrose 5 % 50 mL IVPB     2 g 100 mL/hr over 30 Minutes Intravenous Every 8 hours 09/25/16 1952 09/26/16 1334   09/25/16 1600  vancomycin (VANCOCIN) 1,250 mg in sodium chloride 0.9 % 250 mL IVPB  Status:  Discontinued     1,250 mg 166.7 mL/hr over 90 Minutes Intravenous Every 12 hours 09/25/16 1553 09/25/16 1555   09/25/16 1600  vancomycin (VANCOCIN) 1,500 mg in sodium chloride 0.9 % 500 mL IVPB  Status:  Discontinued     1,500 mg 250 mL/hr over 120 Minutes Intravenous Every 12 hours 09/25/16 1557 09/28/16 1408   09/24/16 1600  cefTAZidime (FORTAZ) 2 g in  dextrose 5 % 50 mL IVPB  Status:  Discontinued     2 g 100 mL/hr over 30 Minutes Intravenous Every 8 hours 09/24/16 1530 09/25/16 0828   09/24/16 1530  metroNIDAZOLE (FLAGYL) tablet 500 mg  Status:  Discontinued     500 mg Oral Every 8 hours 09/24/16 1528 09/30/16 1049   09/24/16 0500  piperacillin-tazobactam (ZOSYN) IVPB 3.375 g  Status:  Discontinued     3.375 g 12.5 mL/hr over 240 Minutes Intravenous Every 8 hours 09/23/16 2133 09/24/16 1528   09/24/16 0400  vancomycin (VANCOCIN) IVPB 1000 mg/200 mL premix  Status:  Discontinued     1,000 mg 200 mL/hr over 60 Minutes Intravenous Every 12 hours 09/23/16 2133 09/25/16 1553   09/24/16 0230  piperacillin-tazobactam (ZOSYN) IVPB  3.375 g  Status:  Discontinued     3.375 g 100 mL/hr over 30 Minutes Intravenous  Once 09/24/16 0223 09/24/16 0226   09/24/16 0230  vancomycin (VANCOCIN) IVPB 1000 mg/200 mL premix  Status:  Discontinued     1,000 mg 200 mL/hr over 60 Minutes Intravenous  Once 09/24/16 0223 09/24/16 0227   09/23/16 2130  piperacillin-tazobactam (ZOSYN) IVPB 3.375 g     3.375 g 100 mL/hr over 30 Minutes Intravenous  Once 09/23/16 2119 09/23/16 2203   09/23/16 2130  vancomycin (VANCOCIN) IVPB 1000 mg/200 mL premix     1,000 mg 200 mL/hr over 60 Minutes Intravenous  Once 09/23/16 2119 09/23/16 2247      Assessment/Plan: s/p Procedure(s): IRRIGATION AND DEBRIDEMENT FOOT (Left) Assessment: 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 Discussed with the patient that at this point I think we can proceed with 1 more debridement to remove the first metatarsal and try to re-debride the foot. Discussed packing this with antibiotic beads. Discussed risks and Locations of the procedure including continued infection as well as still significant risk for below-knee amputation. Obtain consent for first ray resection and re-debridement left foot. Nothing by mouth. Plan for surgery later this evening, hopefully around 8:00  LOS: 10 days    Durward Fortes 10/03/2016

## 2016-10-03 NOTE — H&P (View-Only) (Signed)
5 Days Post-Op   Subjective/Chief Complaint: Patient seen. States that overall he is feeling better.   Objective: Vital signs in last 24 hours: Temp:  [98.4 F (36.9 C)-98.7 F (37.1 C)] 98.7 F (37.1 C) (06/20 0800) Pulse Rate:  [68-81] 81 (06/20 0800) Resp:  [18-20] 20 (06/20 0800) BP: (132-148)/(70-80) 148/80 (06/20 0800) SpO2:  [93 %-98 %] 95 % (06/20 0800) Last BM Date: 10/02/16  Intake/Output from previous day: 06/19 0701 - 06/20 0700 In: 1330 [P.O.:1180; IV Piggyback:150] Out: 500 [Urine:500] Intake/Output this shift: Total I/O In: 480 [P.O.:480] Out: -   Moderate drainage on the bandaging. Upon removal there is still some purulence from the incision along the medial aspect of the rear foot and ankle with some underlying mild necrosis. Exposed bone is still present along the entire first metatarsal on the left foot. Some necrosis is noted along the inferior and distal aspect of the open wound around the first metatarsal with some purulence expressed from beneath the skin bridge dorsally.  Lab Results:  No results for input(s): WBC, HGB, HCT, PLT in the last 72 hours. BMET No results for input(s): NA, K, CL, CO2, GLUCOSE, BUN, CREATININE, CALCIUM in the last 72 hours. PT/INR No results for input(s): LABPROT, INR in the last 72 hours. ABG No results for input(s): PHART, HCO3 in the last 72 hours.  Invalid input(s): PCO2, PO2  Studies/Results: No results found.  Anti-infectives: Anti-infectives    Start     Dose/Rate Route Frequency Ordered Stop   09/28/16 1500  piperacillin-tazobactam (ZOSYN) IVPB 3.375 g     3.375 g 12.5 mL/hr over 240 Minutes Intravenous Every 8 hours 09/28/16 1415     09/28/16 1400  ceftAZIDime (FORTAZ) 2 gram/50 mL D5W IVPB - DUPLEX  Status:  Discontinued     2 g 100 mL/hr over 30 Minutes Intravenous Every 8 hours 09/28/16 0813 09/28/16 1408   09/28/16 0600  cefTAZidime (FORTAZ) 2 g in dextrose 5 % 50 mL IVPB  Status:  Discontinued     2  g 100 mL/hr over 30 Minutes Intravenous Every 8 hours 09/27/16 1621 09/28/16 0813   09/26/16 2200  ceftAZIDime (FORTAZ) 2 gram/50 mL D5W IVPB - DUPLEX  Status:  Discontinued     2 g 100 mL/hr over 30 Minutes Intravenous Every 8 hours 09/26/16 0802 09/27/16 1620   09/25/16 2200  ceftAZIDime (FORTAZ) 2 gram/50 mL D5W IVPB - DUPLEX  Status:  Discontinued     2 g 100 mL/hr over 30 Minutes Intravenous Every 8 hours 09/25/16 0828 09/25/16 1950   09/25/16 2200  ceftAZIDime (FORTAZ) 2 gram/50 mL D5W IVPB - DUPLEX  Status:  Discontinued     2 g 100 mL/hr over 30 Minutes Intravenous Every 8 hours 09/25/16 1951 09/25/16 1951   09/25/16 2200  cefTAZidime (FORTAZ) 2 g in dextrose 5 % 50 mL IVPB     2 g 100 mL/hr over 30 Minutes Intravenous Every 8 hours 09/25/16 1952 09/26/16 1334   09/25/16 1600  vancomycin (VANCOCIN) 1,250 mg in sodium chloride 0.9 % 250 mL IVPB  Status:  Discontinued     1,250 mg 166.7 mL/hr over 90 Minutes Intravenous Every 12 hours 09/25/16 1553 09/25/16 1555   09/25/16 1600  vancomycin (VANCOCIN) 1,500 mg in sodium chloride 0.9 % 500 mL IVPB  Status:  Discontinued     1,500 mg 250 mL/hr over 120 Minutes Intravenous Every 12 hours 09/25/16 1557 09/28/16 1408   09/24/16 1600  cefTAZidime (FORTAZ) 2 g in  dextrose 5 % 50 mL IVPB  Status:  Discontinued     2 g 100 mL/hr over 30 Minutes Intravenous Every 8 hours 09/24/16 1530 09/25/16 0828   09/24/16 1530  metroNIDAZOLE (FLAGYL) tablet 500 mg  Status:  Discontinued     500 mg Oral Every 8 hours 09/24/16 1528 09/30/16 1049   09/24/16 0500  piperacillin-tazobactam (ZOSYN) IVPB 3.375 g  Status:  Discontinued     3.375 g 12.5 mL/hr over 240 Minutes Intravenous Every 8 hours 09/23/16 2133 09/24/16 1528   09/24/16 0400  vancomycin (VANCOCIN) IVPB 1000 mg/200 mL premix  Status:  Discontinued     1,000 mg 200 mL/hr over 60 Minutes Intravenous Every 12 hours 09/23/16 2133 09/25/16 1553   09/24/16 0230  piperacillin-tazobactam (ZOSYN) IVPB  3.375 g  Status:  Discontinued     3.375 g 100 mL/hr over 30 Minutes Intravenous  Once 09/24/16 0223 09/24/16 0226   09/24/16 0230  vancomycin (VANCOCIN) IVPB 1000 mg/200 mL premix  Status:  Discontinued     1,000 mg 200 mL/hr over 60 Minutes Intravenous  Once 09/24/16 0223 09/24/16 0227   09/23/16 2130  piperacillin-tazobactam (ZOSYN) IVPB 3.375 g     3.375 g 100 mL/hr over 30 Minutes Intravenous  Once 09/23/16 2119 09/23/16 2203   09/23/16 2130  vancomycin (VANCOCIN) IVPB 1000 mg/200 mL premix     1,000 mg 200 mL/hr over 60 Minutes Intravenous  Once 09/23/16 2119 09/23/16 2247      Assessment/Plan: s/p Procedure(s): IRRIGATION AND DEBRIDEMENT FOOT (Left) Assessment: 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 Discussed with the patient that at this point I think we can proceed with 1 more debridement to remove the first metatarsal and try to re-debride the foot. Discussed packing this with antibiotic beads. Discussed risks and Locations of the procedure including continued infection as well as still significant risk for below-knee amputation. Obtain consent for first ray resection and re-debridement left foot. Nothing by mouth. Plan for surgery later this evening, hopefully around 8:00  LOS: 10 days    Durward Fortes 10/03/2016

## 2016-10-03 NOTE — Op Note (Signed)
Date of operation: 10/03/2016.  Surgeon: Durward Fortes DPM.  Preoperative diagnosis: Continued abscess and infection with exposed bone left foot status post I&D gas gangrene.   Postoperative diagnosis: Same.  Procedure: 1. Partial first ray resection left foot. 2. Multiple site incision and drainage left foot.  Anesthesia: LMA.  Hemostasis: Pneumatic tourniquet left ankle 250 mmHg.  Estimated blood loss: 15 cc.  Pathology: Left first metatarsal.  Implants: Stimulan rapid cure antibiotic beads impregnated with vancomycin.  Injectables: 20 cc of 0.5% bupivacaine plain.  Complications: None apparent.  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 His previously undergone 2 debridements with amputation of the left great toe. Continued infection present in the left foot and decision was made for a repeat I&D with excision of the first metatarsal and an attempt to salvage his foot prior to having to consider BKA.  Operative procedure: Patient was taken to the operating room and placed on the table in the supine position. Following satisfactory LMA anesthesia a pneumatic tourniquet was applied at the level of the left leg just above the ankle. The foot was then prepped and draped in the usual sterile fashion and the foot was elevation only exsanguinated and the tourniquet inflated to 250 mmHg.   Attention was then directed to the dorsal aspect of the left foot where previous sutures along the medial incision were removed and a new incision was carried from the previous incision distal down to the level of the first metatarsal base where there was exposed bone. The skin flaps were then raised up revealing some continued necrotic and purulent tissue along the entire medial aspect of the foot as well as beneath the dorsal flap. There did appear to be healthy tissues covering the proximalto the first metatarsal. Using a sagittal saw the first  metatarsal was transected across the base just distal to the metatarsocuneiform joint and the bone was freed from the surrounding soft tissue edges using sharp dissection and removed in toto. At this point there was noted be significant purulence expressed from the distal forefoot laterally. The distal sutures from the lateral incision previously were then removed and that incision was carried distally approximately 6 cm down to the level of the Ace of the fourth toe region. The incision was carried sharply down to the level of the deeper tissues where there was noted to be significant purulence and necrotic tissue. This was grossly removed using a ronguer down to the level of the fascial layer covering the bone in an excisional manner. Devitalized tissue was also removed excisionally using a ronguer along the medial aspect of the foot where the first metatarsal was removed. All the remaining tissues were then debrided with a versa jet debrider on a setting of 5-6 throughout the entire wound and then the wound was lightly debrided and irrigated with the versa jet debrider on a setting of 2. The proximal portion of the medial incision proximal to the open wound was closed using 3-0 nylon simple interrupted sutures with antibiotic beads packed along the medial aspect of the foot. The proximal portion of the lateral incision was also closed using 3-0 nylon simple interrupted sutures with some antibody beads placed within the lateral wounds and then the distal aspect of the incision was closed using staples. The remainder of the antibody beads were then placed into the defect at the first metatarsal resection site as well as across the dorsal midfoot and forefoot. The  remaining distal portion of the medial incision was closed using 3-0 nylon simple interrupted suture and staples. An open defect over the base of the first metatarsal was unable to be closed and this was covered using a Mepitel with staples. Xeroform was  then applied to the incision lines followed by 4 x 4's fluffs ABDs Kerlix. The tourniquet was released and a second Kerlix and Ace wrap applied to the left foot. Patient was awakened and transported to the PACU with vital signs stable and in good condition.

## 2016-10-03 NOTE — Transfer of Care (Signed)
Immediate Anesthesia Transfer of Care Note  Patient: Kyle Banks  Procedure(s) Performed: Procedure(s): IRRIGATION AND DEBRIDEMENT FOOT (Left) AMPUTATION TOE-left metatarsal (Left)  Patient Location: PACU  Anesthesia Type:General  Level of Consciousness: sedated  Airway & Oxygen Therapy: Patient Spontanous Breathing and Patient connected to face mask oxygen  Post-op Assessment: Report given to RN and Post -op Vital signs reviewed and stable  Post vital signs: Reviewed and stable  Last Vitals:  Vitals:   10/03/16 1317 10/03/16 2140  BP: (!) 146/74 116/73  Pulse: 87 86  Resp: 20 12  Temp: 37 C 36.4 C    Last Pain:  Vitals:   10/03/16 1553  TempSrc:   PainSc: 4       Patients Stated Pain Goal: 3 (76/15/18 3437)  Complications: No apparent anesthesia complications

## 2016-10-03 NOTE — Progress Notes (Addendum)
Per [facility] liaison [facility] authorization has been received. Patient can D/C to [facility] when medically stable. MD aware of above. Patient is aware of above.     Per [facility] authorization is good for 7 days.   [employer], LCSW [phone]

## 2016-10-04 ENCOUNTER — Encounter: Payer: Self-pay | Admitting: Podiatry

## 2016-10-04 LAB — CBC WITH DIFFERENTIAL/PLATELET
Basophils Absolute: 0.1 10*3/uL (ref 0–0.1)
Basophils Relative: 1 %
Eosinophils Absolute: 0.1 10*3/uL (ref 0–0.7)
Eosinophils Relative: 1 %
HCT: 25.7 % — ABNORMAL LOW (ref 40.0–52.0)
HEMOGLOBIN: 8.8 g/dL — AB (ref 13.0–18.0)
LYMPHS ABS: 2.4 10*3/uL (ref 1.0–3.6)
LYMPHS PCT: 19 %
MCH: 28.9 pg (ref 26.0–34.0)
MCHC: 34.1 g/dL (ref 32.0–36.0)
MCV: 84.7 fL (ref 80.0–100.0)
MONOS PCT: 11 %
Monocytes Absolute: 1.4 10*3/uL — ABNORMAL HIGH (ref 0.2–1.0)
NEUTROS PCT: 68 %
Neutro Abs: 8.6 10*3/uL — ABNORMAL HIGH (ref 1.4–6.5)
Platelets: 510 10*3/uL — ABNORMAL HIGH (ref 150–440)
RBC: 3.03 MIL/uL — ABNORMAL LOW (ref 4.40–5.90)
RDW: 14.7 % — ABNORMAL HIGH (ref 11.5–14.5)
WBC: 12.6 10*3/uL — ABNORMAL HIGH (ref 3.8–10.6)

## 2016-10-04 LAB — AEROBIC/ANAEROBIC CULTURE W GRAM STAIN (SURGICAL/DEEP WOUND)

## 2016-10-04 LAB — GLUCOSE, CAPILLARY
GLUCOSE-CAPILLARY: 122 mg/dL — AB (ref 65–99)
GLUCOSE-CAPILLARY: 158 mg/dL — AB (ref 65–99)
GLUCOSE-CAPILLARY: 170 mg/dL — AB (ref 65–99)
Glucose-Capillary: 115 mg/dL — ABNORMAL HIGH (ref 65–99)
Glucose-Capillary: 118 mg/dL — ABNORMAL HIGH (ref 65–99)
Glucose-Capillary: 119 mg/dL — ABNORMAL HIGH (ref 65–99)
Glucose-Capillary: 125 mg/dL — ABNORMAL HIGH (ref 65–99)

## 2016-10-04 MED ORDER — INSULIN ASPART 100 UNIT/ML ~~LOC~~ SOLN
0.0000 [IU] | Freq: Three times a day (TID) | SUBCUTANEOUS | Status: DC
  Administered 2016-10-04 – 2016-10-05 (×3): 2 [IU] via SUBCUTANEOUS
  Administered 2016-10-06: 3 [IU] via SUBCUTANEOUS
  Administered 2016-10-06: 1 [IU] via SUBCUTANEOUS
  Administered 2016-10-07: 2 [IU] via SUBCUTANEOUS
  Administered 2016-10-07 – 2016-10-08 (×3): 1 [IU] via SUBCUTANEOUS
  Administered 2016-10-09: 2 [IU] via SUBCUTANEOUS
  Administered 2016-10-09 – 2016-10-10 (×5): 1 [IU] via SUBCUTANEOUS
  Filled 2016-10-04 (×15): qty 1

## 2016-10-04 NOTE — Progress Notes (Signed)
[facility] NAME: Kyle Banks    MR#:  [id number]  DATE OF BIRTH:  September 25, 1955  CHIEF COMPLAINT:    Pain in the left foot. S/p surgery with more debridement He has wound VAC to his left foot. Family in the room  REVIEW OF SYSTEMS:   ROS   CONSTITUTIONAL: No fever. EYES: No blurred or double vision.  EARS, NOSE, AND THROAT: No tinnitus or ear pain.  RESPIRATORY: No cough, shortness of breath, wheezing or hemoptysis.  CARDIOVASCULAR: No chest pain, orthopnea, edema.  GASTROINTESTINAL: No nausea, vomiting, diarrhea or abdominal pain.  GENITOURINARY: No dysuria, hematuria.  ENDOCRINE: No polyuria, nocturia. HEMATOLOGY: No anemia, easy bruising or bleeding SKIN: No rash or lesion. MUSCULOSKELETAL: Status post amputation. Pain in both feet. PSYCHIATRY: No anxiety or depression.   DRUG ALLERGIES:  No Known Allergies  VITALS:  Blood pressure (!) 148/70, pulse (!) 102, temperature 99.5 F (37.5 C), temperature source Oral, resp. rate 16, height 5\' 11"  (1.803 m), weight 109.3 kg (241 lb), SpO2 96 %.  PHYSICAL EXAMINATION:  GENERAL:  61 y.o.-year-old patient lying in the bed with no acute distress.  EYES: Pupils equal, round, reactive to light and  accommodation. No scleral icterus. Extraocular muscles intact.  HEENT: Head atraumatic, normocephalic. Oropharynx and nasopharynx clear.  NECK:  Supple, no jugular venous distention. No thyroid enlargement, no tenderness.  LUNGS: Normal breath sounds bilaterally, no wheezing, rales,rhonchi or crepitation. No use of accessory muscles of respiration.  CARDIOVASCULAR: S1, S2 normal. No murmurs, rubs, or gallops.  ABDOMEN: Soft, nontender, nondistended. Bowel sounds present. No organomegaly or mass.  EXTREMITIES;Dressing present to both feet. NEUROLOGIC: Cranial nerves II through XII are intact. Muscle strength 5/5 in all extremities. Sensation intact. Gait not checked.   PSYCHIATRIC: The patient is alert and oriented x 3.  SKIN: Dressing present to both feet. Wound VAC in place  LABORATORY PANEL:   CBC  Recent Labs Lab 10/04/16 0440  WBC 12.6*  HGB 8.8*  HCT 25.7*  PLT 510*   ------------------------------------------------------------------------------------------------------------------  Chemistries   Recent Labs Lab 09/29/16 0404 09/30/16 0347  NA 136  --   K 3.7  --   CL 103  --   CO2 26  --   GLUCOSE 143*  --   BUN 12  --   CREATININE 0.95 0.83  CALCIUM 7.7*  --    ------------------------------------------------------------------------------------------------------------------  Cardiac Enzymes No results for input(s): TROPONINI in the last 168 hours. ------------------------------------------------------------------------------------------------------------------  RADIOLOGY:  No results found.  EKG:   Orders placed or performed during the hospital encounter of 09/23/16  . ED EKG  . 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 Treated with broad-spectrum antibiotics initially. Now changed to Zosyn to cover for group B streptococcus, MSSA and anaerobes. Appreciate infectious disease input. Left foot debridement done on 09/28/2016. Wound VAC placed. Discussed with Podiatry Dr cline. Patient is s/p 1. Partial first ray resection left foot. 2. Multiple site incision and drainage left foot. On 10/04/2016  PICC line placed---IV zosyn till July 9th  #. Diabetes mellitus type 2 uncontrolled HbA1c at 15  Patient has no PCP. Blood sugars were ranging at 400s.  On Lantus 26 units daily, NovoLog 5 units 3 times a day  blood sugar better controlled   #. Pseudo-Hyponatremia due  to hyperglycemia resolved  # Metabolic encephalopathy secondary to sepsis -resolved  # Hypertriglyceridemia Likely  from his uncontrolled diabetes. Started on [facility] this admission.  All the records are reviewed and case discussed with Care Management/Social Worker. Management plans discussed with the patient, family and they are in agreement.  CODE STATUS: FULL CODE  TOTAL TIME TAKING CARE OF THIS PATIENT: 27 minutes  Lavella Myren M.D on 10/04/2016 at 9:44 AM  Between 7am to 6pm - Pager - [phone]  After 6pm go to [email or web address] - password [email or web address] [facility]  Office  [phone]  CC: Primary care physician; Patient, No Pcp Per  Note: This dictation was prepared with Dragon dictation along with smaller phrase technology. Any transcriptional errors that result from this process are unintentional.

## 2016-10-04 NOTE — Progress Notes (Signed)
1 Day Post-Op   Subjective/Chief Complaint: Patient seen. Some significant pain this morning once the numbing medicine wore off. Otherwise doing fairly well.   Objective: Vital signs in last 24 hours: Temp:  [97.6 F (36.4 C)-99.5 F (37.5 C)] 99.5 F (37.5 C) (06/21 0747) Pulse Rate:  [70-108] 92 (06/21 1207) Resp:  [12-19] 18 (06/21 1207) BP: (116-176)/(65-101) 120/65 (06/21 1207) SpO2:  [90 %-100 %] 90 % (06/21 1207) Last BM Date: 10/02/16  Intake/Output from previous day: 06/20 0701 - 06/21 0700 In: 8818.3 [P.O.:720; I.V.:7998.3; IV Piggyback:100] Out: 1510 [Urine:1500; Blood:10] Intake/Output this shift: Total I/O In: 720 [P.O.:720] Out: 250 [Urine:250]  Moderate strikethrough on the bandage on the left foot. Also some bleeding noted through the dressing on the right foot. Upon removal of the right foot dressing there is some dehiscence along the medial aspect of the incision with some maceration and serous drainage with a small amount of purulence mixed in. Mild edema. Soft tissues mildly fluctuant. heavy bleeding is noted on the bandage on the left foot. Upon removal the incisions appear well coapted with some reduction in the edema in the foot. No significant purulence on the bandaging. Does not appear to be any progressive necrosis of the skin as compared to preoperative. Overall stable.  Lab Results:   Recent Labs  10/03/16 2305 10/04/16 0440  WBC 10.5 12.6*  HGB 8.9* 8.8*  HCT 26.1* 25.7*  PLT 492* 510*   BMET No results for input(s): NA, K, CL, CO2, GLUCOSE, BUN, CREATININE, CALCIUM in the last 72 hours. PT/INR No results for input(s): LABPROT, INR in the last 72 hours. ABG No results for input(s): PHART, HCO3 in the last 72 hours.  Invalid input(s): PCO2, PO2  Studies/Results: No results found.  Anti-infectives: Anti-infectives    Start     Dose/Rate Route Frequency Ordered Stop   10/03/16 2038  vancomycin (VANCOCIN) powder  Status:  Discontinued        As needed 10/03/16 2039 10/03/16 2136   09/28/16 1500  piperacillin-tazobactam (ZOSYN) IVPB 3.375 g     3.375 g 12.5 mL/hr over 240 Minutes Intravenous Every 8 hours 09/28/16 1415     09/28/16 1400  ceftAZIDime (FORTAZ) 2 gram/50 mL D5W IVPB - DUPLEX  Status:  Discontinued     2 g 100 mL/hr over 30 Minutes Intravenous Every 8 hours 09/28/16 0813 09/28/16 1408   09/28/16 0600  cefTAZidime (FORTAZ) 2 g in dextrose 5 % 50 mL IVPB  Status:  Discontinued     2 g 100 mL/hr over 30 Minutes Intravenous Every 8 hours 09/27/16 1621 09/28/16 0813   09/26/16 2200  ceftAZIDime (FORTAZ) 2 gram/50 mL D5W IVPB - DUPLEX  Status:  Discontinued     2 g 100 mL/hr over 30 Minutes Intravenous Every 8 hours 09/26/16 0802 09/27/16 1620   09/25/16 2200  ceftAZIDime (FORTAZ) 2 gram/50 mL D5W IVPB - DUPLEX  Status:  Discontinued     2 g 100 mL/hr over 30 Minutes Intravenous Every 8 hours 09/25/16 0828 09/25/16 1950   09/25/16 2200  ceftAZIDime (FORTAZ) 2 gram/50 mL D5W IVPB - DUPLEX  Status:  Discontinued     2 g 100 mL/hr over 30 Minutes Intravenous Every 8 hours 09/25/16 1951 09/25/16 1951   09/25/16 2200  cefTAZidime (FORTAZ) 2 g in dextrose 5 % 50 mL IVPB     2 g 100 mL/hr over 30 Minutes Intravenous Every 8 hours 09/25/16 1952 09/26/16 1334   09/25/16 1600  vancomycin (VANCOCIN)  1,250 mg in sodium chloride 0.9 % 250 mL IVPB  Status:  Discontinued     1,250 mg 166.7 mL/hr over 90 Minutes Intravenous Every 12 hours 09/25/16 1553 09/25/16 1555   09/25/16 1600  vancomycin (VANCOCIN) 1,500 mg in sodium chloride 0.9 % 500 mL IVPB  Status:  Discontinued     1,500 mg 250 mL/hr over 120 Minutes Intravenous Every 12 hours 09/25/16 1557 09/28/16 1408   09/24/16 1600  cefTAZidime (FORTAZ) 2 g in dextrose 5 % 50 mL IVPB  Status:  Discontinued     2 g 100 mL/hr over 30 Minutes Intravenous Every 8 hours 09/24/16 1530 09/25/16 0828   09/24/16 1530  metroNIDAZOLE (FLAGYL) tablet 500 mg  Status:  Discontinued     500  mg Oral Every 8 hours 09/24/16 1528 09/30/16 1049   09/24/16 0500  piperacillin-tazobactam (ZOSYN) IVPB 3.375 g  Status:  Discontinued     3.375 g 12.5 mL/hr over 240 Minutes Intravenous Every 8 hours 09/23/16 2133 09/24/16 1528   09/24/16 0400  vancomycin (VANCOCIN) IVPB 1000 mg/200 mL premix  Status:  Discontinued     1,000 mg 200 mL/hr over 60 Minutes Intravenous Every 12 hours 09/23/16 2133 09/25/16 1553   09/24/16 0230  piperacillin-tazobactam (ZOSYN) IVPB 3.375 g  Status:  Discontinued     3.375 g 100 mL/hr over 30 Minutes Intravenous  Once 09/24/16 0223 09/24/16 0226   09/24/16 0230  vancomycin (VANCOCIN) IVPB 1000 mg/200 mL premix  Status:  Discontinued     1,000 mg 200 mL/hr over 60 Minutes Intravenous  Once 09/24/16 0223 09/24/16 0227   09/23/16 2130  piperacillin-tazobactam (ZOSYN) IVPB 3.375 g     3.375 g 100 mL/hr over 30 Minutes Intravenous  Once 09/23/16 2119 09/23/16 2203   09/23/16 2130  vancomycin (VANCOCIN) IVPB 1000 mg/200 mL premix     1,000 mg 200 mL/hr over 60 Minutes Intravenous  Once 09/23/16 2119 09/23/16 2247      Assessment/Plan: s/p Procedure(s): IRRIGATION AND DEBRIDEMENT FOOT (Left) AMPUTATION TOE-left metatarsal (Left) Assessment: Gas gangrene status post amputation right hallux and ray resection and multiple debridements on the left foot.   Plan: Dressing was reapplied to the feet. Continue to monitor both feet. Re-I&D of the right foot is not completely out of the question but will most likely reassess tomorrow. We will continue to monitor closely  LOS: 11 days    Durward Fortes 10/04/2016

## 2016-10-04 NOTE — Progress Notes (Signed)
Inpatient Diabetes Program Recommendations  AACE/ADA: New Consensus Statement on Inpatient Glycemic Control (2015)  Target Ranges:  Prepandial:   less than 140 mg/dL      [facility] postprandial:   less than 180 mg/dL (1-2 hours)      Critically ill patients:  140 - 180 mg/dL   Lab Results  Component Value Date   GLUCAP 125 (H) 10/04/2016   HGBA1C 15.1 (H) 09/25/2016    Review of Glycemic ControlResults for Kyle Banks, Kyle Banks (MRN [id number]) as of 10/04/2016 11:44  Ref. Range 10/03/2016 19:47 10/03/2016 21:42 10/03/2016 22:17 10/04/2016 00:02 10/04/2016 03:54 10/04/2016 07:31  Glucose-Capillary Latest Ref Range: 65 - 99 mg/dL 73 52 (L) 91 118 (H) 119 (H) 125 (H)    Inpatient Diabetes Program Recommendations:   Please consider reducing intensity of Novolog correction to sensitive tid with meals.   Thanks, Adah Perl, RN, BC-ADM Inpatient Diabetes Coordinator Pager [phone] (8a-5p)

## 2016-10-04 NOTE — Progress Notes (Signed)
Pharmacy Antibiotic Note  Kyle Banks is a 61 y.o. male admitted on 09/23/2016 with sepsis.  Pharmacy was consulted for Zosyn. ID following.  Plan: Zosyn 3.375 g IV q8h EI  Height: 5\' 11"  (180.3 cm) Weight: 241 lb (109.3 kg) IBW/kg (Calculated) : 75.3  Temp (24hrs), Avg:98.5 F (36.9 C), Min:97.6 F (36.4 C), Max:99.5 F (37.5 C)   Recent Labs Lab 09/28/16 0522 09/29/16 0404 09/30/16 0347 10/03/16 2305 10/04/16 0440  WBC 16.5* 16.5*  --  10.5 12.6*  CREATININE 0.94 0.95 0.83  --   --     Estimated Creatinine Clearance: 117.5 mL/min (by C-G formula based on SCr of 0.83 mg/dL).    No Known Allergies  Thank you for allowing pharmacy to be a part of this patient's care.  Deondra Wigger D, Pharm.D., BCPS Clinical Pharmacist 10/04/2016 10:53 AM

## 2016-10-04 NOTE — Anesthesia Postprocedure Evaluation (Signed)
Anesthesia Post Note  Patient: Kyle Banks  Procedure(s) Performed: Procedure(s) (LRB): IRRIGATION AND DEBRIDEMENT FOOT (Left) AMPUTATION TOE-left metatarsal (Left)  Patient location during evaluation: PACU Anesthesia Type: General Level of consciousness: awake and alert Pain management: pain level controlled Vital Signs Assessment: post-procedure vital signs reviewed and stable Respiratory status: spontaneous breathing, nonlabored ventilation, respiratory function stable and patient connected to nasal cannula oxygen Cardiovascular status: blood pressure returned to baseline and stable Postop Assessment: no signs of nausea or vomiting Anesthetic complications: no     Last Vitals:  Vitals:   10/04/16 0011 10/04/16 0357  BP: 127/78 (!) 151/87  Pulse: (!) 108 90  Resp: 19 19  Temp: 37.3 C 36.6 C    Last Pain:  Vitals:   10/04/16 0525  TempSrc:   PainSc: [place] S

## 2016-10-04 NOTE — Progress Notes (Signed)
Physical Therapy Treatment Patient Details Name: Kyle Banks MRN: [id number] DOB: [birth year] Today's Date: 10/04/2016    History of Present Illness (P) Kyle Banks is a 61 y.o. male with no known medical history presents to the emergency department for evaluation of foot wounds. Patient was in a usual state of health until 3 days ago when he describes the sudden onset of redness, pain, swelling and discharge from his feet bilaterally. He states prior to this infection his feet appeared normal. Patient is unaware of any medical problems such as diabetes and states that he has had annual physicals and blood work at various urgent care centers. Patient denies fevers/chills, weakness, dizziness, chest pain, shortness of breath, N/V/C/D, abdominal pain, dysuria/frequency, changes in mental status. Otherwise, there has been no change in status. Patient has been taking medication as prescribed and there has been no recent change in medication or diet.  No recent antibiotics.  There has been no recent illness, hospitalizations, travel or sick contacts. 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 Attempted to peform PT evaluation on 09/24/16 however pt with myoclonic jerks, dizziness, and facial droop when attempting ambulation. Head CT negative for acute infarct. Pt is now s/p I&D on 6/15. Received continue upon transfer order. Pt is now NWB on L LE. Pt is now s/p a L 1st ray resection (performed on 6/20). Pt is still NWB on L LE. New orders received for PT.    PT Comments    PT discussed complete bed rest orders with RN prior to today's treatment. RN changing order from bed rest to up with assistance and MD stated she wants PT to mobilize pt to recliner this AM. Pt progressing well toward his goals. Pt required +2 mod assist for sit to/from stand, but was able to ambulate from EOB to recliner with min-mod assist for safety. Pt is  very motivated to continue participating in therapy and has continued showing overall improvements in his functional status despite surgery last night. Will continue to progress.   Follow Up Recommendations  SNF     Equipment Recommendations  Rolling walker with 5" wheels    Recommendations for Other Services       Precautions / Restrictions Precautions Precautions: Fall Required Braces or Orthoses:  (B post op shoes) Restrictions Weight Bearing Restrictions: Yes LLE Weight Bearing: Non weight bearing    Mobility  Bed Mobility Overal bed mobility: Needs Assistance Bed Mobility: Supine to Sit     Supine to sit: Supervision     General bed mobility comments: Supervision for safety. Demonstrated good technique with no physical assist needed. No dizziness once EOB.  Transfers Overall transfer level: Needs assistance Equipment used: Rolling walker (2 wheeled) Transfers: Sit to/from Stand Sit to Stand: +2 safety/equipment;+2 physical assistance;Mod assist         General transfer comment: Sit to/from stand with +2 mod assist and verbal cueing to stand up fully. Pt struggled to obtain upright position more today than last session. No dizziness or unsteadiness noted once standing. Pt able to maintain NWB status with min verbal cueing.   Ambulation/Gait Ambulation/Gait assistance: Min assist;Mod assist;+2 safety/equipment Ambulation Distance (Feet): 2 Feet Assistive device: Rolling walker (2 wheeled) Gait Pattern/deviations: Trunk flexed     General Gait Details: Ambulated EOB to recliner with RW and +2 min-mod assist for safety. Min verbal cueing to maintain NWB precautions. Pt demonstrated improve balance and technique with pivot on RLE  to ambulate to recliner.    Stairs            Wheelchair Mobility    Modified Rankin (Stroke Patients Only)       Balance Overall balance assessment: No apparent balance deficits (not formally assessed) (Pt's balance much  improved. No unsteadiness noted.)                                          Cognition Arousal/Alertness: Awake/alert Behavior During Therapy: WFL for tasks assessed/performed Overall Cognitive Status: Within Functional Limits for tasks assessed                                        Exercises Other Exercises Other Exercises: Recliner ther-ex x15 B included: ankle pumps (RLE only), quad set, SLR's, hip abd, and glute squeeze. Pt unable to fully straighten L knee with quad set. Pt unable to perform SLR and hip abd on LLE, so pt performed isometrics against PT resistance at thigh. Performed with supervision and cueing for sequencing.    General Comments        Pertinent Vitals/Pain Pain Assessment: Faces Faces Pain Scale: Hurts a little bit Pain Location: L foot Pain Descriptors / Indicators: Operative site guarding;Sore Pain Intervention(s): Limited activity within patient's tolerance;Monitored during session;Premedicated before session;Repositioned    Home Living                      Prior Function            PT Goals (current goals can now be found in the care plan section) Acute Rehab PT Goals Patient Stated Goal: Return to prior function at home PT Goal Formulation: With patient Time For Goal Achievement: 10/09/16 Potential to Achieve Goals: Good Progress towards PT goals: Progressing toward goals    Frequency    7X/week      PT Plan Current plan remains appropriate    Co-evaluation              AM-PAC PT "6 Clicks" Daily Activity  Outcome Measure  Difficulty turning over in bed (including adjusting bedclothes, sheets and blankets)?: A Little Difficulty moving from lying on back to sitting on the side of the bed? : A Little Difficulty sitting down on and standing up from a chair with arms (e.g., wheelchair, bedside commode, etc,.)?: Total Help needed moving to and from a bed to chair (including a wheelchair)?:  A Little Help needed walking in hospital room?: A Lot Help needed climbing 3-5 steps with a railing? : Total 6 Click Score: 13    End of Session Equipment Utilized During Treatment: Gait belt Activity Tolerance: Patient tolerated treatment well Patient left: in chair;with call bell/phone within reach;with family/visitor present Nurse Communication: Mobility status (complete bed rest - RN putting in activity order) PT Visit Diagnosis: Unsteadiness on feet (R26.81);Muscle weakness (generalized) (M62.81)     Time: 9326-7124 PT Time Calculation (min) (ACUTE ONLY): 23 min  Charges:  $Gait Training: 8-22 mins $Therapeutic Exercise: 8-22 mins                    G Codes:       Donaciano Eva, PT, SPT  [place] 10/04/2016, 10:59 AM

## 2016-10-04 NOTE — Progress Notes (Signed)
CH made a follow up visit with pt. Pt was sitting in the chair with his leg stretched forward and his daughter and friend were at the bedside. Pt stated he was feeling better this afternoon because he received a good report from his dc. Pt said he was making steady progress toward full recover and requested for prayers, which [place] provided for pt, for his family and for his friend.   10/04/16 1400  Clinical Encounter Type  Visited With Patient and family together  Visit Type Follow-up;Spiritual support  Referral From [place]

## 2016-10-05 ENCOUNTER — Inpatient Hospital Stay: Payer: 59 | Admitting: Anesthesiology

## 2016-10-05 ENCOUNTER — Ambulatory Visit: Admit: 2016-10-05 | Payer: 59 | Admitting: Podiatry

## 2016-10-05 ENCOUNTER — Encounter
Admission: EM | Disposition: A | Discharge status: Skilled Nursing Facility | Payer: Self-pay | Source: Home / Self Care | Attending: Internal Medicine

## 2016-10-05 HISTORY — PX: IRRIGATION AND DEBRIDEMENT FOOT: SHX6602

## 2016-10-05 LAB — GLUCOSE, CAPILLARY
GLUCOSE-CAPILLARY: 169 mg/dL — AB (ref 65–99)
GLUCOSE-CAPILLARY: 60 mg/dL — AB (ref 65–99)
GLUCOSE-CAPILLARY: 95 mg/dL (ref 65–99)
Glucose-Capillary: 159 mg/dL — ABNORMAL HIGH (ref 65–99)
Glucose-Capillary: 185 mg/dL — ABNORMAL HIGH (ref 65–99)
Glucose-Capillary: 75 mg/dL (ref 65–99)
Glucose-Capillary: 146 mg/dL — ABNORMAL HIGH (ref 65–99)

## 2016-10-05 LAB — SURGICAL PATHOLOGY

## 2016-10-05 SURGERY — IRRIGATION AND DEBRIDEMENT FOOT
Anesthesia: General | Site: Foot | Laterality: Right | Wound class: Dirty or Infected

## 2016-10-05 MED ORDER — VANCOMYCIN HCL 1000 MG IV SOLR
INTRAVENOUS | Status: DC | PRN
  Administered 2016-10-05: 1000 mg

## 2016-10-05 MED ORDER — ONDANSETRON HCL 4 MG/2ML IJ SOLN
INTRAMUSCULAR | Status: DC | PRN
  Administered 2016-10-05: 4 mg via INTRAVENOUS

## 2016-10-05 MED ORDER — FENTANYL CITRATE (PF) 100 MCG/2ML IJ SOLN
INTRAMUSCULAR | Status: DC | PRN
  Administered 2016-10-05 (×2): 25 ug via INTRAVENOUS

## 2016-10-05 MED ORDER — PROPOFOL 10 MG/ML IV BOLUS
INTRAVENOUS | Status: DC | PRN
Start: 2016-10-05 — End: 2016-10-05
  Administered 2016-10-05: 40 mg via INTRAVENOUS

## 2016-10-05 MED ORDER — MIDAZOLAM HCL 2 MG/2ML IJ SOLN
INTRAMUSCULAR | Status: DC | PRN
  Administered 2016-10-05: 2 mg via INTRAVENOUS

## 2016-10-05 MED ORDER — PROPOFOL 500 MG/50ML IV EMUL
INTRAVENOUS | Status: DC | PRN
  Administered 2016-10-05: 100 ug/kg/min via INTRAVENOUS

## 2016-10-05 MED ORDER — LIDOCAINE HCL (PF) 2 % IJ SOLN
INTRAMUSCULAR | Status: AC
Start: 2016-10-05 — End: 2016-10-05
  Filled 2016-10-05: qty 2

## 2016-10-05 MED ORDER — FENTANYL CITRATE (PF) 100 MCG/2ML IJ SOLN
INTRAMUSCULAR | Status: AC
  Filled 2016-10-05: qty 2

## 2016-10-05 MED ORDER — MIDAZOLAM HCL 2 MG/2ML IJ SOLN
INTRAMUSCULAR | Status: AC
  Filled 2016-10-05: qty 2

## 2016-10-05 MED ORDER — ONDANSETRON HCL 4 MG/2ML IJ SOLN: 4.0000 mg | Freq: Once | INTRAMUSCULAR | Status: DC | PRN

## 2016-10-05 MED ORDER — DEXTROSE 50 % IV SOLN
12.5000 g | Freq: Once | INTRAVENOUS | Status: AC
  Administered 2016-10-05: 12.5 g via INTRAVENOUS

## 2016-10-05 MED ORDER — FENTANYL CITRATE (PF) 100 MCG/2ML IJ SOLN: 25.0000 ug | INTRAMUSCULAR | Status: DC | PRN

## 2016-10-05 MED ORDER — PROPOFOL 10 MG/ML IV BOLUS
INTRAVENOUS | Status: AC
  Filled 2016-10-05: qty 20

## 2016-10-05 MED ORDER — PROPOFOL 500 MG/50ML IV EMUL
INTRAVENOUS | Status: AC
  Filled 2016-10-05: qty 50

## 2016-10-05 MED ORDER — DEXTROSE 50 % IV SOLN
INTRAVENOUS | Status: AC
  Filled 2016-10-05: qty 50

## 2016-10-05 MED ORDER — PHENYLEPHRINE HCL 10 MG/ML IJ SOLN
INTRAMUSCULAR | Status: DC | PRN
  Administered 2016-10-05 (×2): 100 ug via INTRAVENOUS

## 2016-10-05 MED ORDER — ONDANSETRON HCL 4 MG/2ML IJ SOLN
INTRAMUSCULAR | Status: AC
  Filled 2016-10-05: qty 2

## 2016-10-05 MED ORDER — SODIUM CHLORIDE 0.9 % IV SOLN
INTRAVENOUS | Status: DC | PRN
  Administered 2016-10-05: 17:00:00 via INTRAVENOUS

## 2016-10-05 MED ORDER — BUPIVACAINE HCL (PF) 0.5 % IJ SOLN
INTRAMUSCULAR | Status: DC | PRN
  Administered 2016-10-05: 10 mL

## 2016-10-05 SURGICAL SUPPLY — 48 items
BANDAGE ACE 4X5 VEL STRL LF (GAUZE/BANDAGES/DRESSINGS) ×3 IMPLANT
BANDAGE ELASTIC 4 LF NS (GAUZE/BANDAGES/DRESSINGS) ×3 IMPLANT
BLADE OSCILLATING/SAGITTAL (BLADE) ×2
BLADE SURG 15 STRL LF DISP TIS (BLADE) ×1 IMPLANT
BLADE SURG 15 STRL SS (BLADE) ×2
BLADE SW THK.38XMED LNG THN (BLADE) ×1 IMPLANT
BNDG ESMARK 4X12 TAN STRL LF (GAUZE/BANDAGES/DRESSINGS) ×3 IMPLANT
BNDG GAUZE 4.5X4.1 6PLY STRL (MISCELLANEOUS) ×3 IMPLANT
CANISTER SUCT 1200ML W/VALVE (MISCELLANEOUS) ×3 IMPLANT
CUFF TOURN 18 STER (MISCELLANEOUS) ×3 IMPLANT
CUFF TOURN DUAL PL 12 NO SLV (MISCELLANEOUS) ×3 IMPLANT
DRAPE FLUOR MINI C-ARM 54X84 (DRAPES) IMPLANT
DURAPREP 26ML APPLICATOR (WOUND CARE) ×3 IMPLANT
ELECT REM PT RETURN 9FT ADLT (ELECTROSURGICAL) ×3
ELECTRODE REM PT RTRN 9FT ADLT (ELECTROSURGICAL) ×1 IMPLANT
GAUZE PETRO XEROFOAM 1X8 (MISCELLANEOUS) ×3 IMPLANT
GAUZE SPONGE 4X4 12PLY STRL (GAUZE/BANDAGES/DRESSINGS) ×3 IMPLANT
GAUZE STRETCH 2X75IN STRL (MISCELLANEOUS) ×3 IMPLANT
GAUZE XEROFORM 4X4 STRL (GAUZE/BANDAGES/DRESSINGS) ×3 IMPLANT
GLOVE BIO SURGEON STRL SZ7.5 (GLOVE) ×3 IMPLANT
GLOVE INDICATOR 8.0 STRL GRN (GLOVE) ×3 IMPLANT
GOWN STRL REUS W/ TWL LRG LVL3 (GOWN DISPOSABLE) ×2 IMPLANT
GOWN STRL REUS W/TWL LRG LVL3 (GOWN DISPOSABLE) ×4
HANDPIECE VERSAJET DEBRIDEMENT (MISCELLANEOUS) ×3 IMPLANT
KIT STIMULAN 5CC (Orthopedic Implant) ×3 IMPLANT
LABEL OR SOLS (LABEL) ×3 IMPLANT
NEEDLE FILTER BLUNT 18X 1/2SAF (NEEDLE) ×2
NEEDLE FILTER BLUNT 18X1 1/2 (NEEDLE) ×1 IMPLANT
NEEDLE HYPO 25X1 1.5 SAFETY (NEEDLE) ×9 IMPLANT
NS IRRIG 500ML POUR BTL (IV SOLUTION) ×3 IMPLANT
PACK EXTREMITY ARMC (MISCELLANEOUS) ×3 IMPLANT
PAD ABD DERMACEA PRESS 5X9 (GAUZE/BANDAGES/DRESSINGS) ×3 IMPLANT
PENCIL ELECTRO HAND CTR (MISCELLANEOUS) ×3 IMPLANT
RASP SM TEAR CROSS CUT (RASP) ×3 IMPLANT
SOL .9 NS 3000ML IRR  AL (IV SOLUTION) ×2
SOL .9 NS 3000ML IRR UROMATIC (IV SOLUTION) ×1 IMPLANT
SOL PREP PVP 2OZ (MISCELLANEOUS) ×3
SOLUTION PREP PVP 2OZ (MISCELLANEOUS) ×1 IMPLANT
STOCKINETTE STRL 4IN 9604848 (GAUZE/BANDAGES/DRESSINGS) ×3 IMPLANT
STOCKINETTE STRL 6IN 960660 (GAUZE/BANDAGES/DRESSINGS) ×3 IMPLANT
SUT ETHILON 4-0 (SUTURE) ×2
SUT ETHILON 4-0 FS2 18XMFL BLK (SUTURE) ×1
SUT VIC AB 3-0 SH 27 (SUTURE) ×2
SUT VIC AB 3-0 SH 27X BRD (SUTURE) ×1 IMPLANT
SUT VIC AB 4-0 FS2 27 (SUTURE) ×3 IMPLANT
SUTURE ETHLN 4-0 FS2 18XMF BLK (SUTURE) ×1 IMPLANT
SYR 3ML LL SCALE MARK (SYRINGE) ×3 IMPLANT
SYRINGE 10CC LL (SYRINGE) ×6 IMPLANT

## 2016-10-05 NOTE — Progress Notes (Signed)
Physical Therapy Treatment Patient Details Name: Kyle Banks MRN: [id number] DOB: May 01, 1955 Today's Date: 10/05/2016    History of Present Illness Kyle Banks is a 61 y.o. male with no known medical history presents to the emergency department for evaluation of foot wounds. Patient was in a usual state of health until 3 days ago when he describes the sudden onset of redness, pain, swelling and discharge from his feet bilaterally. He states prior to this infection his feet appeared normal. Patient is unaware of any medical problems such as diabetes and states that he has had annual physicals and blood work at various urgent care centers. Patient denies fevers/chills, weakness, dizziness, chest pain, shortness of breath, N/V/C/D, abdominal pain, dysuria/frequency, changes in mental status. Otherwise, there has been no change in status. Patient has been taking medication as prescribed and there has been no recent change in medication or diet.  No recent antibiotics.  There has been no recent illness, hospitalizations, travel or sick contacts. 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 Attempted to peform PT evaluation on 09/24/16 however pt with myoclonic jerks, dizziness, and facial droop when attempting ambulation. Head CT negative for acute infarct. Pt is now s/p I&D on 6/15. Received continue upon transfer order. Pt is now NWB on L LE. Pt is now s/p a L 1st ray resection (performed on 6/20). Pt is still NWB on L LE. New orders received for PT.    PT Comments    Pt progressing well toward goals. Bed mobility performed with supervision for safety and transfers performed with mod assist for sit to/from stand with heavy verbal cueing for sequencing. Pt ambulated from EOB to recliner (14ft) with RW and min assist for safety and verbal cueing. Pt's technique for pivoting on RLE has improved and he follows NWB precautions with  min verbal cueing. Will continue to progress UE/LE/core there-ex to resisted training next session.     Follow Up Recommendations  SNF     Equipment Recommendations  Rolling walker with 5" wheels    Recommendations for Other Services       Precautions / Restrictions Precautions Precautions: Fall Required Braces or Orthoses:  (B post op shoes) Restrictions Weight Bearing Restrictions: Yes LLE Weight Bearing: Non weight bearing    Mobility  Bed Mobility Overal bed mobility: Needs Assistance Bed Mobility: Supine to Sit     Supine to sit: Supervision     General bed mobility comments: Supervision for safety. Demonstrated good technique. Upright posture noted once EOB with UE's in lap (versus out to side for support).  Transfers Overall transfer level: Needs assistance Equipment used: Rolling walker (2 wheeled) Transfers: Sit to/from Stand Sit to Stand: Mod assist         General transfer comment: Heavy verbal cueing to push up from bed and use arms to stand up fully (pt tended to stay crouched until mod assist and cueing provided). No dizziness or unsteadiness noted once standing. Pt maintained his NWB status with min verbal cueing. Post op shoes donned B prior to transfer.  Ambulation/Gait Ambulation/Gait assistance: Min assist Ambulation Distance (Feet): 2 Feet Assistive device: Rolling walker (2 wheeled) Gait Pattern/deviations: Trunk flexed;Wide base of support     General Gait Details: Ambulated EOB to recliner with RW and min assist. Heavy verbal cueing for hand placement on recliner and management of walker while maintaining NWB status. Pt steady on feet.    Stairs  Wheelchair Mobility    Modified Rankin (Stroke Patients Only)       Balance Overall balance assessment: No apparent balance deficits (not formally assessed)                                          Cognition Arousal/Alertness: Awake/alert Behavior  During Therapy: WFL for tasks assessed/performed Overall Cognitive Status: Within Functional Limits for tasks assessed                                        Exercises Other Exercises Other Exercises: Supine ther-ex x15 B included: ankle pumps, SLR's, and hip abduction. Performed with supervision. EOB ther-ex x15 B included: posterior to anterior core work (pt able to lean back 45-60deg) and LAQ's. Performed with CGA for safety.    General Comments        Pertinent Vitals/Pain Pain Assessment: Faces Faces Pain Scale: Hurts a little bit Pain Location: L foot Pain Descriptors / Indicators: Operative site guarding;Sore Pain Intervention(s): Limited activity within patient's tolerance;Monitored during session;Premedicated before session;Repositioned    Home Living                      Prior Function            PT Goals (current goals can now be found in the care plan section) Acute Rehab PT Goals Patient Stated Goal: Return to prior function at home PT Goal Formulation: With patient Time For Goal Achievement: 10/09/16 Potential to Achieve Goals: Good Progress towards PT goals: Progressing toward goals    Frequency    7X/week      PT Plan Current plan remains appropriate    Co-evaluation              AM-PAC PT "6 Clicks" Daily Activity  Outcome Measure  Difficulty turning over in bed (including adjusting bedclothes, sheets and blankets)?: None Difficulty moving from lying on back to sitting on the side of the bed? : A Little Difficulty sitting down on and standing up from a chair with arms (e.g., wheelchair, bedside commode, etc,.)?: Total Help needed moving to and from a bed to chair (including a wheelchair)?: A Little Help needed walking in hospital room?: A Lot Help needed climbing 3-5 steps with a railing? : Total 6 Click Score: 14    End of Session Equipment Utilized During Treatment: Gait belt Activity Tolerance: Patient  tolerated treatment well Patient left: in chair;with call bell/phone within reach (podiatrist in room) Nurse Communication: Mobility status PT Visit Diagnosis: Unsteadiness on feet (R26.81);Muscle weakness (generalized) (M62.81)     Time: 0037-0488 PT Time Calculation (min) (ACUTE ONLY): 13 min  Charges:                       G Codes:       Donaciano Eva, PT, SPT  [place] 10/05/2016, 11:12 AM

## 2016-10-05 NOTE — H&P (View-Only) (Signed)
2 Days Post-Op   Subjective/Chief Complaint: Patient seen. Pain in the left foot is improved.   Objective: Vital signs in last 24 hours: Temp:  [98.4 F (36.9 C)-99.7 F (37.6 C)] 98.9 F (37.2 C) (06/22 0402) Pulse Rate:  [91-93] 92 (06/22 0402) Resp:  [18-19] 18 (06/22 0402) BP: (108-138)/(51-75) 130/71 (06/22 0402) SpO2:  [90 %-95 %] 95 % (06/22 0402) Last BM Date: 10/02/16  Intake/Output from previous day: 06/21 0701 - 06/22 0700 In: 1110 [P.O.:960; IV Piggyback:150] Out: 2000 [Urine:2000] Intake/Output this shift: Total I/O In: 240 [P.O.:240] Out: 300 [Urine:300]  Assessment the right foot wound again. Still some significant drainage with purulence from the medial aspect of the incision. Some continued erythema and edema in the forefoot.  Lab Results:   Recent Labs  10/03/16 2305 10/04/16 0440  WBC 10.5 12.6*  HGB 8.9* 8.8*  HCT 26.1* 25.7*  PLT 492* 510*   BMET No results for input(s): NA, K, CL, CO2, GLUCOSE, BUN, CREATININE, CALCIUM in the last 72 hours. PT/INR No results for input(s): LABPROT, INR in the last 72 hours. ABG No results for input(s): PHART, HCO3 in the last 72 hours.  Invalid input(s): PCO2, PO2  Studies/Results: No results found.  Anti-infectives: Anti-infectives    Start     Dose/Rate Route Frequency Ordered Stop   10/03/16 2038  vancomycin (VANCOCIN) powder  Status:  Discontinued       As needed 10/03/16 2039 10/03/16 2136   09/28/16 1500  piperacillin-tazobactam (ZOSYN) IVPB 3.375 g     3.375 g 12.5 mL/hr over 240 Minutes Intravenous Every 8 hours 09/28/16 1415     09/28/16 1400  ceftAZIDime (FORTAZ) 2 gram/50 mL D5W IVPB - DUPLEX  Status:  Discontinued     2 g 100 mL/hr over 30 Minutes Intravenous Every 8 hours 09/28/16 0813 09/28/16 1408   09/28/16 0600  cefTAZidime (FORTAZ) 2 g in dextrose 5 % 50 mL IVPB  Status:  Discontinued     2 g 100 mL/hr over 30 Minutes Intravenous Every 8 hours 09/27/16 1621 09/28/16 0813   09/26/16 2200  ceftAZIDime (FORTAZ) 2 gram/50 mL D5W IVPB - DUPLEX  Status:  Discontinued     2 g 100 mL/hr over 30 Minutes Intravenous Every 8 hours 09/26/16 0802 09/27/16 1620   09/25/16 2200  ceftAZIDime (FORTAZ) 2 gram/50 mL D5W IVPB - DUPLEX  Status:  Discontinued     2 g 100 mL/hr over 30 Minutes Intravenous Every 8 hours 09/25/16 0828 09/25/16 1950   09/25/16 2200  ceftAZIDime (FORTAZ) 2 gram/50 mL D5W IVPB - DUPLEX  Status:  Discontinued     2 g 100 mL/hr over 30 Minutes Intravenous Every 8 hours 09/25/16 1951 09/25/16 1951   09/25/16 2200  cefTAZidime (FORTAZ) 2 g in dextrose 5 % 50 mL IVPB     2 g 100 mL/hr over 30 Minutes Intravenous Every 8 hours 09/25/16 1952 09/26/16 1334   09/25/16 1600  vancomycin (VANCOCIN) 1,250 mg in sodium chloride 0.9 % 250 mL IVPB  Status:  Discontinued     1,250 mg 166.7 mL/hr over 90 Minutes Intravenous Every 12 hours 09/25/16 1553 09/25/16 1555   09/25/16 1600  vancomycin (VANCOCIN) 1,500 mg in sodium chloride 0.9 % 500 mL IVPB  Status:  Discontinued     1,500 mg 250 mL/hr over 120 Minutes Intravenous Every 12 hours 09/25/16 1557 09/28/16 1408   09/24/16 1600  cefTAZidime (FORTAZ) 2 g in dextrose 5 % 50 mL IVPB  Status:  Discontinued     2 g 100 mL/hr over 30 Minutes Intravenous Every 8 hours 09/24/16 1530 09/25/16 0828   09/24/16 1530  metroNIDAZOLE (FLAGYL) tablet 500 mg  Status:  Discontinued     500 mg Oral Every 8 hours 09/24/16 1528 09/30/16 1049   09/24/16 0500  piperacillin-tazobactam (ZOSYN) IVPB 3.375 g  Status:  Discontinued     3.375 g 12.5 mL/hr over 240 Minutes Intravenous Every 8 hours 09/23/16 2133 09/24/16 1528   09/24/16 0400  vancomycin (VANCOCIN) IVPB 1000 mg/200 mL premix  Status:  Discontinued     1,000 mg 200 mL/hr over 60 Minutes Intravenous Every 12 hours 09/23/16 2133 09/25/16 1553   09/24/16 0230  piperacillin-tazobactam (ZOSYN) IVPB 3.375 g  Status:  Discontinued     3.375 g 100 mL/hr over 30 Minutes Intravenous   Once 09/24/16 0223 09/24/16 0226   09/24/16 0230  vancomycin (VANCOCIN) IVPB 1000 mg/200 mL premix  Status:  Discontinued     1,000 mg 200 mL/hr over 60 Minutes Intravenous  Once 09/24/16 0223 09/24/16 0227   09/23/16 2130  piperacillin-tazobactam (ZOSYN) IVPB 3.375 g     3.375 g 100 mL/hr over 30 Minutes Intravenous  Once 09/23/16 2119 09/23/16 2203   09/23/16 2130  vancomycin (VANCOCIN) IVPB 1000 mg/200 mL premix     1,000 mg 200 mL/hr over 60 Minutes Intravenous  Once 09/23/16 2119 09/23/16 2247      Assessment/Plan: s/p Procedure(s): IRRIGATION AND DEBRIDEMENT FOOT (Left) AMPUTATION TOE-left metatarsal (Left) Assessment: Continued abscess right foot amputation site.   Plan: Discussed with the patient proceeding with a repeat I&D of his right foot to try to clean up the infection. Discussed risks associated with the procedure including continued infection and inability to heal due to his diabetes and circulation. Obtain a consent form for I&D of right foot. Nothing by mouth. Plan for surgery later this evening.  LOS: 12 days    Durward Fortes 10/05/2016

## 2016-10-05 NOTE — Interval H&P Note (Signed)
History and Physical Interval Note:  10/05/2016 5:59 PM  Kyle Banks  has presented today for surgery, with the diagnosis of n/a  The various methods of treatment have been discussed with the patient and family. After consideration of risks, benefits and other options for treatment, the patient has consented to  Procedure(s): IRRIGATION AND DEBRIDEMENT FOOT (Right) as a surgical intervention .  The patient's history has been reviewed, patient examined, no change in status, stable for surgery.  I have reviewed the patient's chart and labs.  Questions were answered to the patient's satisfaction.     Durward Fortes

## 2016-10-05 NOTE — Progress Notes (Signed)
[facility] NAME: Kyle Banks    MR#:  [id number]  DATE OF BIRTH:  1955-11-23  CHIEF COMPLAINT:    Pain in the left foot. S/p surgery with more debridement on 6/20  Family in the room  REVIEW OF SYSTEMS:   ROS   CONSTITUTIONAL: No fever. EYES: No blurred or double vision.  EARS, NOSE, AND THROAT: No tinnitus or ear pain.  RESPIRATORY: No cough, shortness of breath, wheezing or hemoptysis.  CARDIOVASCULAR: No chest pain, orthopnea, edema.  GASTROINTESTINAL: No nausea, vomiting, diarrhea or abdominal pain.  GENITOURINARY: No dysuria, hematuria.  ENDOCRINE: No polyuria, nocturia. HEMATOLOGY: No anemia, easy bruising or bleeding SKIN: No rash or lesion. MUSCULOSKELETAL: Status post amputation. Pain in both feet. PSYCHIATRY: No anxiety or depression.   DRUG ALLERGIES:  No Known Allergies  VITALS:  Blood pressure 130/71, pulse 92, temperature 98.9 F (37.2 C), temperature source Oral, resp. rate 18, height 5\' 11"  (1.803 m), weight 109.3 kg (241 lb), SpO2 95 %.  PHYSICAL EXAMINATION:  GENERAL:  61 y.o.-year-old patient lying in the bed with no acute distress.  EYES: Pupils equal, round, reactive to light and  accommodation. No scleral icterus. Extraocular muscles intact.  HEENT: Head atraumatic, normocephalic. Oropharynx and nasopharynx clear.  NECK:  Supple, no jugular venous distention. No thyroid enlargement, no tenderness.  LUNGS: Normal breath sounds bilaterally, no wheezing, rales,rhonchi or crepitation. No use of accessory muscles of respiration.  CARDIOVASCULAR: S1, S2 normal. No murmurs, rubs, or gallops.  ABDOMEN: Soft, nontender, nondistended. Bowel sounds present. No organomegaly or mass.  EXTREMITIES;Dressing present to both feet. NEUROLOGIC: Cranial nerves II through XII are intact. Muscle strength 5/5 in all extremities. Sensation intact. Gait not checked.  PSYCHIATRIC: The patient is alert and  oriented x 3.  SKIN: Dressing present to both feet. Wound VAC in place  LABORATORY PANEL:   CBC  Recent Labs Lab 10/04/16 0440  WBC 12.6*  HGB 8.8*  HCT 25.7*  PLT 510*   ------------------------------------------------------------------------------------------------------------------  Chemistries   Recent Labs Lab 09/29/16 0404 09/30/16 0347  NA 136  --   K 3.7  --   CL 103  --   CO2 26  --   GLUCOSE 143*  --   BUN 12  --   CREATININE 0.95 0.83  CALCIUM 7.7*  --    ------------------------------------------------------------------------------------------------------------------  Cardiac Enzymes No results for input(s): TROPONINI in the last 168 hours. ------------------------------------------------------------------------------------------------------------------  RADIOLOGY:  No results found.  EKG:   Orders placed or performed during the hospital encounter of 09/23/16  . ED EKG  . 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 Treated with broad-spectrum antibiotics initially. Now changed to Zosyn to cover for group B streptococcus, MSSA and anaerobes. Appreciate infectious disease input. Left foot debridement done on 09/28/2016. Wound VAC placed--now removed Discussed with Podiatry Dr cline. Patient is s/p 1. Partial first ray resection left foot. 2. Multiple site incision and drainage left foot. On 10/03/2016  PICC line placed---IV zosyn till July 9th -pt will need more cleaning of his right foot 9will be done over the weekend per dr Fayne Norrie  #. Diabetes mellitus type 2 uncontrolled HbA1c at 15  Patient has no PCP. Blood sugars were ranging at 400s.  On Lantus 26 units daily, NovoLog 5 units 3 times  a day  blood sugar better controlled   #. Pseudo-Hyponatremia due to hyperglycemia resolved  # Metabolic encephalopathy  secondary to sepsis -resolved  # Hypertriglyceridemia Likely from his uncontrolled diabetes. Started on [facility] this admission.  All the records are reviewed and case discussed with Care Management/Social Worker. Management plans discussed with the patient, family and they are in agreement.  CODE STATUS: FULL CODE  TOTAL TIME TAKING CARE OF THIS PATIENT: 55 minutes  Kyle Banks M.D on 10/05/2016 at 9:21 AM  Between 7am to 6pm - Pager - [phone]  After 6pm go to [email or web address] - password [email or web address] [facility]  Office  [phone]  CC: Primary care physician; Patient, No Pcp Per  Note: This dictation was prepared with Dragon dictation along with smaller phrase technology. Any transcriptional errors that result from this process are unintentional.

## 2016-10-05 NOTE — Progress Notes (Signed)
[place] INFECTIOUS DISEASE PROGRESS NOTE Date of Admission:  09/23/2016     ID: Kyle Banks is a 61 y.o. male with  Active Problems:   Sepsis due to cellulitis [facility])  Subjective: No fevers, more alert, tolerating abx  ROS  Eleven systems are reviewed and negative except per hpi  Medications:  Antibiotics Given (last 72 hours)    Date/Time Action Medication Dose Rate   10/02/16 1436 New Bag/Given   piperacillin-tazobactam (ZOSYN) IVPB 3.375 g 3.375 g 12.5 mL/hr   10/02/16 2138 New Bag/Given   piperacillin-tazobactam (ZOSYN) IVPB 3.375 g 3.375 g 12.5 mL/hr   10/03/16 0549 New Bag/Given   piperacillin-tazobactam (ZOSYN) IVPB 3.375 g 3.375 g 12.5 mL/hr   10/03/16 1421 New Bag/Given   piperacillin-tazobactam (ZOSYN) IVPB 3.375 g 3.375 g 12.5 mL/hr   10/03/16 2038 Given  [mixed within beads]   vancomycin (VANCOCIN) powder 2 g    10/04/16 0520 New Bag/Given   piperacillin-tazobactam (ZOSYN) IVPB 3.375 g 3.375 g 12.5 mL/hr   10/04/16 1250 New Bag/Given   piperacillin-tazobactam (ZOSYN) IVPB 3.375 g 3.375 g 12.5 mL/hr   10/04/16 2031 New Bag/Given   piperacillin-tazobactam (ZOSYN) IVPB 3.375 g 3.375 g 12.5 mL/hr   10/05/16 0518 New Bag/Given   piperacillin-tazobactam (ZOSYN) IVPB 3.375 g 3.375 g 12.5 mL/hr     . aspirin EC  81 mg Oral Daily  . chlorhexidine  60 mL Topical Once  . enoxaparin (LOVENOX) injection  40 mg Subcutaneous Q24H  . fenofibrate  160 mg Oral Daily  . insulin aspart  0-9 Units Subcutaneous TID WC  . insulin aspart  5 Units Subcutaneous TID WC  . insulin glargine  26 Units Subcutaneous Q24H  . insulin starter kit- pen needles  1 kit Other Once  . nutrition supplement (JUVEN)  1 packet Oral BID BM  . protein supplement shake  11 oz Oral Q24H  . senna-docusate  2 tablet Oral BID  . sodium chloride flush  10-40 mL Intracatheter Q12H  . sodium chloride flush  3 mL Intravenous Q12H    Objective: Vital signs in last 24 hours: Temp:  [98.4 F (36.9  C)-99.7 F (37.6 C)] 98.9 F (37.2 C) (06/22 0402) Pulse Rate:  [91-93] 92 (06/22 0402) Resp:  [18-19] 18 (06/22 0402) BP: (108-138)/(51-75) 130/71 (06/22 0402) SpO2:  [93 %-95 %] 95 % (06/22 0402)  Constitutional: He is obese, awake, interactive HENT: anicteric Mouth/Throat: Oropharynx is clear and dry. No oropharyngeal exudate.  Cardiovascular: Tachycardia, regular Pulmonary/Chest: Effort normal and breath sounds normal.  Abdominal: Soft. Obese Bowel sounds are normal. He exhibits no distension. There is no tenderness.  Lymphadenopathy: He has no cervical adenopathy.  Neurological: He is alert and oriented to person, place, and time.  Skin: bil leg wrapped L in wound vac Psychiatric: interactive  Lab Results  Recent Labs  10/03/16 2305 10/04/16 0440  WBC 10.5 12.6*  HGB 8.9* 8.8*  HCT 26.1* 25.7*   Lab Results  Component Value Date   ESRSEDRATE 107 (H) 09/25/2016   Lab Results  Component Value Date   CRP 35.6 (H) 09/25/2016    Microbiology: Results for orders placed or performed during the hospital encounter of 09/23/16  Blood culture (routine x 2)     Status: Abnormal   Collection Time: 09/23/16  7:44 PM  Result Value Ref Range Status   Specimen Description BLOOD LEFT ANTECUBITAL  Final   Special Requests   Final    BOTTLES DRAWN AEROBIC AND ANAEROBIC Blood Culture adequate volume  Culture  Setup Time   Final    GRAM POSITIVE COCCI IN BOTH AEROBIC AND ANAEROBIC BOTTLES CRITICAL RESULT CALLED TO, READ BACK BY AND VERIFIED WITH: JASON ROBBINS AT 3007 ON 09/24/2016 JJB    Culture (A)  Final    GROUP B STREP(S.AGALACTIAE)ISOLATED SUSCEPTIBILITIES PERFORMED ON PREVIOUS CULTURE WITHIN THE LAST 5 DAYS.    Report Status 09/27/2016 FINAL  Final  Blood culture (routine x 2)     Status: Abnormal   Collection Time: 09/23/16  7:44 PM  Result Value Ref Range Status   Specimen Description BLOOD RIGHT ANTECUBITAL  Final   Special Requests   Final    BOTTLES DRAWN  AEROBIC AND ANAEROBIC Blood Culture adequate volume   Culture  Setup Time   Final    GRAM POSITIVE COCCI IN BOTH AEROBIC AND ANAEROBIC BOTTLES CRITICAL RESULT CALLED TO, READ BACK BY AND VERIFIED WITH: JASON ROBBINS AT 6226 ON 09/24/2016 JJB Performed at [facility], [address], [place], [place] [address]    Culture GROUP B STREP(S.AGALACTIAE)ISOLATED (A)  Final   Report Status 09/27/2016 FINAL  Final   Organism ID, Bacteria GROUP B STREP(S.AGALACTIAE)ISOLATED  Final      Susceptibility   Group b strep(s.agalactiae)isolated - MIC*    CLINDAMYCIN <=0.25 SENSITIVE Sensitive     AMPICILLIN <=0.25 SENSITIVE Sensitive     ERYTHROMYCIN <=0.12 SENSITIVE Sensitive     VANCOMYCIN 0.5 SENSITIVE Sensitive     CEFTRIAXONE <=0.12 SENSITIVE Sensitive     LEVOFLOXACIN 1 SENSITIVE Sensitive     * GROUP B STREP(S.AGALACTIAE)ISOLATED  Blood Culture ID Panel (Reflexed)     Status: Abnormal   Collection Time: 09/23/16  7:44 PM  Result Value Ref Range Status   Enterococcus species NOT DETECTED NOT DETECTED Final   Listeria monocytogenes NOT DETECTED NOT DETECTED Final   Staphylococcus species NOT DETECTED NOT DETECTED Final   Staphylococcus aureus NOT DETECTED NOT DETECTED Final   Streptococcus species DETECTED (A) NOT DETECTED Final    Comment: CRITICAL RESULT CALLED TO, READ BACK BY AND VERIFIED WITH: JASON ROBBINS AT 1608 ON 09/24/2016 JJB    Streptococcus agalactiae DETECTED (A) NOT DETECTED Final    Comment: CRITICAL RESULT CALLED TO, READ BACK BY AND VERIFIED WITH: JASON ROBBINS AT 1608 ON 09/24/2016 JJB    Streptococcus pneumoniae NOT DETECTED NOT DETECTED Final   Streptococcus pyogenes NOT DETECTED NOT DETECTED Final   Acinetobacter baumannii NOT DETECTED NOT DETECTED Final   Enterobacteriaceae species NOT DETECTED NOT DETECTED Final   Enterobacter cloacae complex NOT DETECTED NOT DETECTED Final   Escherichia coli NOT DETECTED NOT DETECTED Final   Klebsiella oxytoca NOT DETECTED  NOT DETECTED Final   Klebsiella pneumoniae NOT DETECTED NOT DETECTED Final   Proteus species NOT DETECTED NOT DETECTED Final   Serratia marcescens NOT DETECTED NOT DETECTED Final   Haemophilus influenzae NOT DETECTED NOT DETECTED Final   Neisseria meningitidis NOT DETECTED NOT DETECTED Final   Pseudomonas aeruginosa NOT DETECTED NOT DETECTED Final   Candida albicans NOT DETECTED NOT DETECTED Final   Candida glabrata NOT DETECTED NOT DETECTED Final   Candida krusei NOT DETECTED NOT DETECTED Final   Candida parapsilosis NOT DETECTED NOT DETECTED Final   Candida tropicalis NOT DETECTED NOT DETECTED Final  Aerobic/Anaerobic Culture (surgical/deep wound)     Status: None   Collection Time: 09/24/16 12:34 AM  Result Value Ref Range Status   Specimen Description WOUND RIGHT GREAT TOE  Final   Special Requests NONE  Final  Gram Stain   Final    RARE WBC PRESENT, PREDOMINANTLY PMN RARE SQUAMOUS EPITHELIAL CELLS PRESENT ABUNDANT GRAM POSITIVE COCCI IN PAIRS MODERATE GRAM NEGATIVE RODS RARE GRAM POSITIVE RODS    Culture   Final    MODERATE GROUP B STREP(S.AGALACTIAE)ISOLATED TESTING AGAINST S. AGALACTIAE NOT ROUTINELY PERFORMED DUE TO PREDICTABILITY OF AMP/PEN/VAN SUSCEPTIBILITY. FEW STAPHYLOCOCCUS AUREUS FEW PREVOTELLA BIVIA BETA LACTAMASE POSITIVE Performed at [facility], [place] [address], [place], [place] [address]    Report Status 09/28/2016 FINAL  Final   Organism ID, Bacteria STAPHYLOCOCCUS AUREUS  Final      Susceptibility   Staphylococcus aureus - MIC*    CIPROFLOXACIN <=0.5 SENSITIVE Sensitive     ERYTHROMYCIN >=8 RESISTANT Resistant     GENTAMICIN <=0.5 SENSITIVE Sensitive     OXACILLIN <=0.25 SENSITIVE Sensitive     TETRACYCLINE <=1 SENSITIVE Sensitive     VANCOMYCIN <=0.5 SENSITIVE Sensitive     TRIMETH/SULFA <=10 SENSITIVE Sensitive     CLINDAMYCIN RESISTANT Resistant     RIFAMPIN <=0.5 SENSITIVE Sensitive     Inducible Clindamycin POSITIVE Resistant     * FEW  STAPHYLOCOCCUS AUREUS  Aerobic/Anaerobic Culture (surgical/deep wound)     Status: None   Collection Time: 09/24/16 12:35 AM  Result Value Ref Range Status   Specimen Description WOUND LEFT GREAT TOE  Final   Special Requests NONE  Final   Gram Stain   Final    FEW WBC PRESENT, PREDOMINANTLY PMN ABUNDANT GRAM NEGATIVE RODS IN PAIRS ABUNDANT GRAM NEGATIVE RODS FEW GRAM POSITIVE RODS    Culture   Final    ABUNDANT GROUP B STREP(S.AGALACTIAE)ISOLATED TESTING AGAINST S. AGALACTIAE NOT ROUTINELY PERFORMED DUE TO PREDICTABILITY OF AMP/PEN/VAN SUSCEPTIBILITY. ABUNDANT PREVOTELLA BIVIA BETA LACTAMASE POSITIVE Performed at [facility], [place] [address], [place], [place] [address]    Report Status 09/28/2016 FINAL  Final  CULTURE, BLOOD (ROUTINE X 2) w Reflex to ID Panel     Status: None   Collection Time: 09/26/16  7:14 PM  Result Value Ref Range Status   Specimen Description BLOOD BLOOD RIGHT HAND  Final   Special Requests   Final    BOTTLES DRAWN AEROBIC AND ANAEROBIC Blood Culture results may not be optimal due to an excessive volume of blood received in culture bottles   Culture NO GROWTH 5 DAYS  Final   Report Status 10/01/2016 FINAL  Final  CULTURE, BLOOD (ROUTINE X 2) w Reflex to ID Panel     Status: None   Collection Time: 09/26/16  7:22 PM  Result Value Ref Range Status   Specimen Description BLOOD [facility]  Final   Special Requests   Final    BOTTLES DRAWN AEROBIC AND ANAEROBIC Blood Culture results may not be optimal due to an excessive volume of blood received in culture bottles   Culture NO GROWTH 5 DAYS  Final   Report Status 10/01/2016 FINAL  Final  MRSA PCR Screening     Status: None   Collection Time: 09/27/16  5:07 PM  Result Value Ref Range Status   MRSA by PCR NEGATIVE NEGATIVE Final    Comment:        The GeneXpert MRSA Assay (FDA approved for NASAL specimens only), is one component of a comprehensive MRSA colonization surveillance program. It is not intended  to diagnose MRSA infection nor to guide or monitor treatment for MRSA infections.   Aerobic/Anaerobic Culture (surgical/deep wound)     Status: None   Collection Time:  09/28/16  4:22 PM  Result Value Ref Range Status   Specimen Description ABSCESS  Final   Special Requests NONE  Final   Gram Stain   Final    MODERATE WBC PRESENT, PREDOMINANTLY PMN RARE SQUAMOUS EPITHELIAL CELLS PRESENT NO ORGANISMS SEEN    Culture   Final    RARE STREPTOCOCCUS ANGINOSIS RARE ENTEROCOCCUS FAECALIS NO ANAEROBES ISOLATED Performed at [facility], [place] [address] [place], [place] [address]    Report Status 10/04/2016 FINAL  Final   Organism ID, Bacteria ENTEROCOCCUS FAECALIS  Final      Susceptibility   Enterococcus faecalis - MIC*    AMPICILLIN <=2 SENSITIVE Sensitive     VANCOMYCIN 1 SENSITIVE Sensitive     GENTAMICIN SYNERGY RESISTANT Resistant     * RARE ENTEROCOCCUS FAECALIS    Studies/Results: No results found.  Assessment/Plan: Kyle Banks is a 61 y.o. male with bil great toe infections with gas noted in tissue, and wbc 25 and fever 101 on admit. Sugars were >400, cr elevated (unclear baseline).  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  Per op note on the L the infection extended back to dorsal midfoot and extensive debridement was done.  BXC + GBStrep. Wound cx GB strep and Staph aureus (MSSA) .ESR 107, crp 35, A1c 15.3 6/13- vascular surg consulted, podiatry feels will need further debridement on L foot. 6/15- s/p revasc and repeat surgery  6/18 - R foot doing well. has wound vac in place on L. Still may need further debridement.  June 20 - no fevers, for repeat debridement of l foot June 22- for further debridemnet this pm. Repeat cx now with enterococcus as well.  Recommendations Zosyn to cover GB strep, MSSA and anaerobes. This also covers enterococcus Will extend  treatment until July 13th but may need longer. - considering [facility] resources for DC - see updated abx  order sheet - done 6/22 Thank you very much for the consult. Will follow with you.  Zayli Villafuerte P   10/05/2016, 2:32 PM

## 2016-10-05 NOTE — Anesthesia Post-op Follow-up Note (Cosign Needed)
Anesthesia QCDR form completed.        

## 2016-10-05 NOTE — Progress Notes (Addendum)
Clinical Education officer, museum (CSW) met with patient and his daughter Ernestine Conrad was at bedside. CSW explained that per MD patient will likely not D/C over the weekend. CSW also explained that [place] authorization was received on 10/03/16 and is good for 7 days from 10/03/16. Patient and daughter verbalized their understanding. [name] is aware of above. CSW will continue to follow and assist as needed.   Per [facility] authorization is good through Tuesday 10/09/16.    [employer], LCSW [phone]

## 2016-10-05 NOTE — Transfer of Care (Signed)
Immediate Anesthesia Transfer of Care Note  Patient: Hilario Robarts  Procedure(s) Performed: Procedure(s): IRRIGATION AND DEBRIDEMENT FOOT (Right)  Patient Location: PACU  Anesthesia Type:General  Level of Consciousness: awake, alert  and oriented  Airway & Oxygen Therapy: Patient Spontanous Breathing and Patient connected to face mask oxygen  Post-op Assessment: Report given to RN and Post -op Vital signs reviewed and stable  Post vital signs: Reviewed and stable  Last Vitals:  Vitals:   10/05/16 1524 10/05/16 1856  BP: (!) 106/48 99/63  Pulse: 99 78  Resp: 18 14  Temp: 36.9 C (P) 36.1 C    Last Pain:  Vitals:   10/05/16 1856  TempSrc: Temporal  PainSc:       Patients Stated Pain Goal: 2 (67/70/34 0352)  Complications: No apparent anesthesia complications

## 2016-10-05 NOTE — Op Note (Signed)
Date of operation: 10/05/2016.  Surgeon: Durward Fortes DPM.  Preoperative diagnosis: Continued abscess right foot status post amputation great toe.  Postoperative diagnosis: Same.  Procedure: Repeat incision and drainage deep abscess right foot.  Anesthesia: Local Mac.  Hemostasis: Pneumatic tourniquet right ankle 250 mmHg.  Estimated blood loss: 5 10 cc.  Implants: Stimulan rapid cure antibiotic beads impregnated with vancomycin.  Consultations: None apparent.  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 Postoperatively he has had continued drainage with some increased purulence and abscess and decision was made for repeat I&D.  Operative procedure: Patient was taken to the operating room and placed in the table in the supine position. Following satisfactory sedation the right foot was anesthetized with 10 cc of 0.5% bupivacaine plain around the first metatarsal. A pneumatic tourniquet was applied at the level of the right ankle and the foot was prepped and draped in the usual sterile fashion. The foot was elevation only exsanguinated and the tourniquet inflated to 250 mmHg.   Attention was then directed to the right foot amputation site where sutures were removed and the agitation site was reopened. Small incision needed to be made along the central aspect where the incision had actually healed up but the remainder was open. There was noted to be some significant purulence and devitalized tissue along the dorsal lateral aspect of the first metatarsal extending into the first interspace as well as around the plantar and medial aspect involving the medial aspect of the first metatarsal head and tibial sesamoid region. The incision medially was extended by approximately 2 cm and freed from the deeper tissue. At this point the tibial sesamoid was excised using a 15 blade and the involved capsular tissue from  around the dorsal and medial aspect of the first metatarsal also was resected. The deeper devitalized necrotic tissue in the first interspace was removed using a ronguer grossly and then all of the remaining wound edges were debrided using a versa jet debrider on a setting of 5. The wound was then thoroughly irrigated with the versa jet on a setting of 2. At this point Stimulan rapid cure antibiotic beads impregnated with vancomycin were placed into the deeper portions of the wound and the medial and lateral aspects of the incision were closed using 3-0 nylon simple interrupted sutures. The remainder of the beads were placed within the central portion of the wound covering the first metatarsal head and the remainder of the incision was closed with 3-0 nylon simple interrupted sutures. Xeroform and a sterile gauze bandage was applied. Tourniquet was released and a second Kerlix and Ace wrap were applied for coverage and compression. Patient tolerated the procedure and anesthesia well and was transported to the PACU with vital signs stable and in good condition.

## 2016-10-05 NOTE — Progress Notes (Signed)
Infectious Disease Long Term IV Antibiotic Orders  Diagnosis: Grp B strep bacteremia and diabetic foot infection  Culture results Wound - MSSA, Grp B strep, anaerobes, enterococcus, Blood - Grp B Strep  Lab Results  Component Value Date   ESRSEDRATE 107 (H) 09/25/2016   Lab Results  Component Value Date   CREATININE 0.83 09/30/2016   Lab Results  Component Value Date   CRP 35.6 (H) 09/25/2016   Lab Results  Component Value Date   WBC 12.6 (H) 10/04/2016   HGB 8.8 (L) 10/04/2016   HCT 25.7 (L) 10/04/2016   MCV 84.7 10/04/2016   PLT 510 (H) 10/04/2016    Allergies: No Known Allergies  Discharge antibiotics FAX weekly labs to [phone] Zosyn 3/375  grams every 8 hours PICC Care per protocol Labs weekly while on IV antibiotics      CBC w diff   Comprehensive met panel ESR CRP  Planned duration of antibiotics 3-4 weeks Stop date July 13  Follow up clinic date approx approx July 13th   Leonel Ramsay, MD

## 2016-10-05 NOTE — Progress Notes (Signed)
Pharmacy Antibiotic Note  Kyle Banks is a 61 y.o. male admitted on 09/23/2016 with sepsis.  Pharmacy was consulted for Zosyn. ID following.  Plan: Zosyn 3.375 g IV q8h EI  Height: 5\' 11"  (180.3 cm) Weight: 241 lb (109.3 kg) IBW/kg (Calculated) : 75.3  Temp (24hrs), Avg:99.1 F (37.3 C), Min:98.4 F (36.9 C), Max:99.7 F (37.6 C)   Recent Labs Lab 09/29/16 0404 09/30/16 0347 10/03/16 2305 10/04/16 0440  WBC 16.5*  --  10.5 12.6*  CREATININE 0.95 0.83  --   --     Estimated Creatinine Clearance: 117.5 mL/min (by C-G formula based on SCr of 0.83 mg/dL).    No Known Allergies  Thank you for allowing pharmacy to be a part of this patient's care.  Kyle Banks, Pharm.Banks., BCPS Clinical Pharmacist 10/05/2016 10:08 AM

## 2016-10-05 NOTE — Progress Notes (Signed)
2 Days Post-Op   Subjective/Chief Complaint: Patient seen. Pain in the left foot is improved.   Objective: Vital signs in last 24 hours: Temp:  [98.4 F (36.9 C)-99.7 F (37.6 C)] 98.9 F (37.2 C) (06/22 0402) Pulse Rate:  [91-93] 92 (06/22 0402) Resp:  [18-19] 18 (06/22 0402) BP: (108-138)/(51-75) 130/71 (06/22 0402) SpO2:  [90 %-95 %] 95 % (06/22 0402) Last BM Date: 10/02/16  Intake/Output from previous day: 06/21 0701 - 06/22 0700 In: 1110 [P.O.:960; IV Piggyback:150] Out: 2000 [Urine:2000] Intake/Output this shift: Total I/O In: 240 [P.O.:240] Out: 300 [Urine:300]  Assessment the right foot wound again. Still some significant drainage with purulence from the medial aspect of the incision. Some continued erythema and edema in the forefoot.  Lab Results:   Recent Labs  10/03/16 2305 10/04/16 0440  WBC 10.5 12.6*  HGB 8.9* 8.8*  HCT 26.1* 25.7*  PLT 492* 510*   BMET No results for input(s): NA, K, CL, CO2, GLUCOSE, BUN, CREATININE, CALCIUM in the last 72 hours. PT/INR No results for input(s): LABPROT, INR in the last 72 hours. ABG No results for input(s): PHART, HCO3 in the last 72 hours.  Invalid input(s): PCO2, PO2  Studies/Results: No results found.  Anti-infectives: Anti-infectives    Start     Dose/Rate Route Frequency Ordered Stop   10/03/16 2038  vancomycin (VANCOCIN) powder  Status:  Discontinued       As needed 10/03/16 2039 10/03/16 2136   09/28/16 1500  piperacillin-tazobactam (ZOSYN) IVPB 3.375 g     3.375 g 12.5 mL/hr over 240 Minutes Intravenous Every 8 hours 09/28/16 1415     09/28/16 1400  ceftAZIDime (FORTAZ) 2 gram/50 mL D5W IVPB - DUPLEX  Status:  Discontinued     2 g 100 mL/hr over 30 Minutes Intravenous Every 8 hours 09/28/16 0813 09/28/16 1408   09/28/16 0600  cefTAZidime (FORTAZ) 2 g in dextrose 5 % 50 mL IVPB  Status:  Discontinued     2 g 100 mL/hr over 30 Minutes Intravenous Every 8 hours 09/27/16 1621 09/28/16 0813   09/26/16 2200  ceftAZIDime (FORTAZ) 2 gram/50 mL D5W IVPB - DUPLEX  Status:  Discontinued     2 g 100 mL/hr over 30 Minutes Intravenous Every 8 hours 09/26/16 0802 09/27/16 1620   09/25/16 2200  ceftAZIDime (FORTAZ) 2 gram/50 mL D5W IVPB - DUPLEX  Status:  Discontinued     2 g 100 mL/hr over 30 Minutes Intravenous Every 8 hours 09/25/16 0828 09/25/16 1950   09/25/16 2200  ceftAZIDime (FORTAZ) 2 gram/50 mL D5W IVPB - DUPLEX  Status:  Discontinued     2 g 100 mL/hr over 30 Minutes Intravenous Every 8 hours 09/25/16 1951 09/25/16 1951   09/25/16 2200  cefTAZidime (FORTAZ) 2 g in dextrose 5 % 50 mL IVPB     2 g 100 mL/hr over 30 Minutes Intravenous Every 8 hours 09/25/16 1952 09/26/16 1334   09/25/16 1600  vancomycin (VANCOCIN) 1,250 mg in sodium chloride 0.9 % 250 mL IVPB  Status:  Discontinued     1,250 mg 166.7 mL/hr over 90 Minutes Intravenous Every 12 hours 09/25/16 1553 09/25/16 1555   09/25/16 1600  vancomycin (VANCOCIN) 1,500 mg in sodium chloride 0.9 % 500 mL IVPB  Status:  Discontinued     1,500 mg 250 mL/hr over 120 Minutes Intravenous Every 12 hours 09/25/16 1557 09/28/16 1408   09/24/16 1600  cefTAZidime (FORTAZ) 2 g in dextrose 5 % 50 mL IVPB  Status:  Discontinued     2 g 100 mL/hr over 30 Minutes Intravenous Every 8 hours 09/24/16 1530 09/25/16 0828   09/24/16 1530  metroNIDAZOLE (FLAGYL) tablet 500 mg  Status:  Discontinued     500 mg Oral Every 8 hours 09/24/16 1528 09/30/16 1049   09/24/16 0500  piperacillin-tazobactam (ZOSYN) IVPB 3.375 g  Status:  Discontinued     3.375 g 12.5 mL/hr over 240 Minutes Intravenous Every 8 hours 09/23/16 2133 09/24/16 1528   09/24/16 0400  vancomycin (VANCOCIN) IVPB 1000 mg/200 mL premix  Status:  Discontinued     1,000 mg 200 mL/hr over 60 Minutes Intravenous Every 12 hours 09/23/16 2133 09/25/16 1553   09/24/16 0230  piperacillin-tazobactam (ZOSYN) IVPB 3.375 g  Status:  Discontinued     3.375 g 100 mL/hr over 30 Minutes Intravenous   Once 09/24/16 0223 09/24/16 0226   09/24/16 0230  vancomycin (VANCOCIN) IVPB 1000 mg/200 mL premix  Status:  Discontinued     1,000 mg 200 mL/hr over 60 Minutes Intravenous  Once 09/24/16 0223 09/24/16 0227   09/23/16 2130  piperacillin-tazobactam (ZOSYN) IVPB 3.375 g     3.375 g 100 mL/hr over 30 Minutes Intravenous  Once 09/23/16 2119 09/23/16 2203   09/23/16 2130  vancomycin (VANCOCIN) IVPB 1000 mg/200 mL premix     1,000 mg 200 mL/hr over 60 Minutes Intravenous  Once 09/23/16 2119 09/23/16 2247      Assessment/Plan: s/p Procedure(s): IRRIGATION AND DEBRIDEMENT FOOT (Left) AMPUTATION TOE-left metatarsal (Left) Assessment: Continued abscess right foot amputation site.   Plan: Discussed with the patient proceeding with a repeat I&D of his right foot to try to clean up the infection. Discussed risks associated with the procedure including continued infection and inability to heal due to his diabetes and circulation. Obtain a consent form for I&D of right foot. Nothing by mouth. Plan for surgery later this evening.  LOS: 12 days    Durward Fortes 10/05/2016

## 2016-10-05 NOTE — Anesthesia Preprocedure Evaluation (Addendum)
Anesthesia Evaluation  Patient identified by MRN, date of birth, ID band Patient awake    Reviewed: Allergy & Precautions, NPO status , Patient's Chart, lab work & pertinent test results, reviewed documented beta blocker date and time   Airway Mallampati: III  TM Distance: >3 FB     Dental  (+) Chipped   Pulmonary neg pulmonary ROS,    Pulmonary exam normal        Cardiovascular negative cardio ROS Normal cardiovascular exam     Neuro/Psych negative neurological ROS     GI/Hepatic negative GI ROS, Neg liver ROS,   Endo/Other  diabetes, Poorly Controlled, Type 2  Renal/GU negative Renal ROS     Musculoskeletal   Abdominal Normal abdominal exam  (+)   Peds  Hematology  (+) anemia ,   Anesthesia Other Findings Cellulitis.  Reproductive/Obstetrics                                                             Anesthesia Evaluation  Patient identified by MRN, date of birth, ID band Patient awake    Reviewed: Allergy & Precautions, NPO status , Patient's Chart, lab work & pertinent test results, reviewed documented beta blocker date and time   History of Anesthesia Complications Negative for: history of anesthetic complications  Airway Mallampati: II  TM Distance: >3 FB     Dental  (+)    Pulmonary neg pulmonary ROS, Sleep apnea: suggestive hx but not diagnosed. , neg COPD,    breath sounds clear to auscultation- rhonchi (-) wheezing      Cardiovascular Exercise Tolerance: Good (-) hypertension(-) CAD and (-) Past MI  Rhythm:Regular Rate:Normal - Systolic murmurs and - Diastolic murmurs    Neuro/Psych negative neurological ROS  negative psych ROS   GI/Hepatic negative GI ROS, Neg liver ROS,   Endo/Other  diabetes, Insulin Dependent  Renal/GU negative Renal ROS     Musculoskeletal negative musculoskeletal ROS (+)   Abdominal (+) + obese,   Peds   Hematology negative hematology ROS (+)   Anesthesia Other Findings   Reproductive/Obstetrics                            Anesthesia Physical Anesthesia Plan  ASA: II  Anesthesia Plan: General   Post-op Pain Management:    Induction: Intravenous  PONV Risk Score and Plan: 1 and Ondansetron, Dexamethasone, Propofol and Midazolam  Airway Management Planned: LMA  Additional Equipment:   Intra-op Plan:   Post-operative Plan:   Informed Consent: I have reviewed the patients History and Physical, chart, labs and discussed the procedure including the risks, benefits and alternatives for the proposed anesthesia with the patient or authorized representative who has indicated his/her understanding and acceptance.     Plan Discussed with: CRNA  Anesthesia Plan Comments:        Anesthesia Quick Evaluation  Anesthesia Physical  Anesthesia Plan  ASA: III and emergent  Anesthesia Plan: General   Post-op Pain Management:    Induction: Intravenous  PONV Risk Score and Plan:   Airway Management Planned: LMA  Additional Equipment:   Intra-op Plan:   Post-operative Plan:   Informed Consent: I have reviewed the patients History and Physical, chart, labs and discussed the procedure including the  risks, benefits and alternatives for the proposed anesthesia with the patient or authorized representative who has indicated his/her understanding and acceptance.     Plan Discussed with: CRNA  Anesthesia Plan Comments:        Anesthesia Quick Evaluation

## 2016-10-06 LAB — BASIC METABOLIC PANEL
Anion gap: 3 — ABNORMAL LOW (ref 5–15)
BUN: 11 mg/dL (ref 6–20)
CALCIUM: 8 mg/dL — AB (ref 8.9–10.3)
CHLORIDE: 102 mmol/L (ref 101–111)
CO2: 28 mmol/L (ref 22–32)
CREATININE: 0.93 mg/dL (ref 0.61–1.24)
GFR calc non Af Amer: >60 mL/min (ref >60)
Glucose, Bld: 179 mg/dL — ABNORMAL HIGH (ref 65–99)
Potassium: 3.9 mmol/L (ref 3.5–5.1)
Sodium: 133 mmol/L — ABNORMAL LOW (ref 135–145)

## 2016-10-06 LAB — GLUCOSE, CAPILLARY
GLUCOSE-CAPILLARY: 117 mg/dL — AB (ref 65–99)
GLUCOSE-CAPILLARY: 137 mg/dL — AB (ref 65–99)
Glucose-Capillary: 210 mg/dL — ABNORMAL HIGH (ref 65–99)
Glucose-Capillary: 131 mg/dL — ABNORMAL HIGH (ref 65–99)
Glucose-Capillary: 123 mg/dL — ABNORMAL HIGH (ref 65–99)

## 2016-10-06 LAB — CBC
HEMATOCRIT: 24.2 % — AB (ref 40.0–52.0)
HEMOGLOBIN: 8.2 g/dL — AB (ref 13.0–18.0)
MCH: 28.6 pg (ref 26.0–34.0)
MCHC: 33.7 g/dL (ref 32.0–36.0)
MCV: 84.8 fL (ref 80.0–100.0)
Platelets: 484 10*3/uL — ABNORMAL HIGH (ref 150–440)
RBC: 2.86 MIL/uL — ABNORMAL LOW (ref 4.40–5.90)
RDW: 14.8 % — AB (ref 11.5–14.5)
WBC: 9.8 10*3/uL (ref 3.8–10.6)

## 2016-10-06 LAB — FERRITIN: Ferritin: 316 ng/mL (ref 24–336)

## 2016-10-06 LAB — SEDIMENTATION RATE: Sed Rate: 128 mm/hr — ABNORMAL HIGH (ref 0–20)

## 2016-10-06 NOTE — Progress Notes (Signed)
Patient ID: Kyle Banks, male   DOB: 11-18-1955, 61 y.o.   MRN: [id number]  [facility] PROGRESS NOTE  Kyle Banks ALP:[id number] DOB: 04-14-1956 DOA: 09/23/2016 PCP: Patient, No Pcp Per  HPI/Subjective: Patient feels okay and offers no complaints. Some pain in his feet. Just got called with patient had an episode of nausea and vomiting and some sweating.  Objective: Vitals:   10/06/16 0500 10/06/16 0824  BP: 126/89 123/78  Pulse: (!) 101 93  Resp:  18  Temp:  98.7 F (37.1 C)    Filed Weights   09/24/16 0218 09/26/16 1452 10/02/16 0421  Weight: 99.2 kg (218 lb 9.6 oz) 98.9 kg (218 lb) 109.3 kg (241 lb)    ROS: Review of Systems  Constitutional: Negative for chills and fever.  Eyes: Negative for blurred vision.  Respiratory: Negative for cough and shortness of breath.   Cardiovascular: Negative for chest pain.  Gastrointestinal: Positive for nausea and vomiting. Negative for abdominal pain, constipation and diarrhea.  Genitourinary: Negative for dysuria.  Musculoskeletal: Positive for joint pain.  Neurological: Negative for dizziness and headaches.   Exam: Physical Exam  Constitutional: He is oriented to person, place, and time.  HENT:  Nose: No mucosal edema.  Mouth/Throat: No oropharyngeal exudate or posterior oropharyngeal edema.  Eyes: Conjunctivae, EOM and lids are normal. Pupils are equal, round, and reactive to light.  Neck: No JVD present. Carotid bruit is not present. No edema present. No thyroid mass and no thyromegaly present.  Cardiovascular: S1 normal and S2 normal.  Exam reveals no gallop.   No murmur heard. Pulses:      Dorsalis pedis pulses are 2+ on the right side, and 2+ on the left side.  Respiratory: No respiratory distress. He has no wheezes. He has no rhonchi. He has no rales.  GI: Soft. Bowel sounds are normal. There is no tenderness.  Musculoskeletal:       Right ankle: He exhibits swelling.       Left ankle: He exhibits swelling.   Lymphadenopathy:    He has no cervical adenopathy.  Neurological: He is alert and oriented to person, place, and time. No cranial nerve deficit.  Skin: Skin is warm. No rash noted. Nails show no clubbing.  Psychiatric: He has a normal mood and affect.      Data Reviewed: Basic Metabolic Panel:  Recent Labs Lab 09/30/16 0347 10/06/16 0450  NA  --  133*  K  --  3.9  CL  --  102  CO2  --  28  GLUCOSE  --  179*  BUN  --  11  CREATININE 0.83 0.93  CALCIUM  --  8.0*   CBC:  Recent Labs Lab 10/03/16 2305 10/04/16 0440 10/06/16 0450  WBC 10.5 12.6* 9.8  NEUTROABS 7.0* 8.6*  --   HGB 8.9* 8.8* 8.2*  HCT 26.1* 25.7* 24.2*  MCV 84.7 84.7 84.8  PLT 492* 510* 484*    CBG:  Recent Labs Lab 10/05/16 1900 10/05/16 1931 10/05/16 2117 10/06/16 0725 10/06/16 1112  GLUCAP 60* 95 146* 210* 131*    Recent Results (from the past 240 hour(s))  CULTURE, BLOOD (ROUTINE X 2) w Reflex to ID Panel     Status: None   Collection Time: 09/26/16  7:14 PM  Result Value Ref Range Status   Specimen Description BLOOD BLOOD RIGHT HAND  Final   Special Requests   Final    BOTTLES DRAWN AEROBIC AND ANAEROBIC Blood Culture results may not be optimal  due to an excessive volume of blood received in culture bottles   Culture NO GROWTH 5 DAYS  Final   Report Status 10/01/2016 FINAL  Final  CULTURE, BLOOD (ROUTINE X 2) w Reflex to ID Panel     Status: None   Collection Time: 09/26/16  7:22 PM  Result Value Ref Range Status   Specimen Description BLOOD Kyle Banks  Final   Special Requests   Final    BOTTLES DRAWN AEROBIC AND ANAEROBIC Blood Culture results may not be optimal due to an excessive volume of blood received in culture bottles   Culture NO GROWTH 5 DAYS  Final   Report Status 10/01/2016 FINAL  Final  MRSA PCR Screening     Status: None   Collection Time: 09/27/16  5:07 PM  Result Value Ref Range Status   MRSA by PCR NEGATIVE NEGATIVE Final    Comment:        The GeneXpert MRSA Assay  (FDA approved for NASAL specimens only), is one component of a comprehensive MRSA colonization surveillance program. It is not intended to diagnose MRSA infection nor to guide or monitor treatment for MRSA infections.   Aerobic/Anaerobic Culture (surgical/deep wound)     Status: None   Collection Time: 09/28/16  4:22 PM  Result Value Ref Range Status   Specimen Description ABSCESS  Final   Special Requests NONE  Final   Gram Stain   Final    MODERATE WBC PRESENT, PREDOMINANTLY PMN RARE SQUAMOUS EPITHELIAL CELLS PRESENT NO ORGANISMS SEEN    Culture   Final    RARE STREPTOCOCCUS ANGINOSIS RARE ENTEROCOCCUS FAECALIS NO ANAEROBES ISOLATED Performed at [facility], [place] [address], [place], [place] [address]    Report Status 10/04/2016 FINAL  Final   Organism ID, Bacteria ENTEROCOCCUS FAECALIS  Final      Susceptibility   Enterococcus faecalis - MIC*    AMPICILLIN <=2 SENSITIVE Sensitive     VANCOMYCIN 1 SENSITIVE Sensitive     GENTAMICIN SYNERGY RESISTANT Resistant     * RARE ENTEROCOCCUS FAECALIS      Scheduled Meds: . aspirin EC  81 mg Oral Daily  . chlorhexidine  60 mL Topical Once  . enoxaparin (LOVENOX) injection  40 mg Subcutaneous Q24H  . fenofibrate  160 mg Oral Daily  . insulin aspart  0-9 Units Subcutaneous TID WC  . insulin aspart  5 Units Subcutaneous TID WC  . insulin glargine  26 Units Subcutaneous Q24H  . insulin starter kit- pen needles  1 kit Other Once  . nutrition supplement (JUVEN)  1 packet Oral BID BM  . protein supplement shake  11 oz Oral Q24H  . senna-docusate  2 tablet Oral BID  . sodium chloride flush  10-40 mL Intracatheter Q12H  . sodium chloride flush  3 mL Intravenous Q12H   Continuous Infusions: . piperacillin-tazobactam (ZOSYN)  IV Stopped (10/06/16 0939)    Assessment/Plan:  1. 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 Group B Streptococcus sepsis. 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 On IV Zosyn through July 13 as per ID note. Has PICC line. Dr. Cleda Mccreedy will evaluate both wounds on Monday to determine if further procedures are needed. 2. Type 2 diabetes mellitus uncontrolled. Hemoglobin A1c is 15.1. Sugars trended better. Patient on Lantus and sliding scale. Diet exercise and weight loss discussed at length. 3. Likely anemia of chronic disease.  4. Thrombocytosis likely acute phase reactant with infection  Code  Status:     Code Status Orders        Start     Ordered   09/24/16 0223  Full code  Continuous     09/24/16 0223    Code Status History    Date Active Date Inactive Code Status Order ID Comments User Context   This patient has a current code status but no historical code status.      Disposition Plan: To be determined  Consultants:  Infectious disease  Podiatry  Antibiotics:  IV Zosyn  Time spent:  28 minutes  [place], [place]

## 2016-10-06 NOTE — Progress Notes (Signed)
Physical Therapy Treatment Patient Details Name: Kyle Banks MRN: [id number] DOB: 03-09-1956 Today's Date: 10/06/2016    History of Present Illness Kyle Banks is a 61 y.o. male with no known medical history presents to the emergency department for evaluation of foot wounds. Patient was in a usual state of health until 3 days ago when he describes the sudden onset of redness, pain, swelling and discharge from his feet bilaterally. He states prior to this infection his feet appeared normal. Patient is unaware of any medical problems such as diabetes and states that he has had annual physicals and blood work at various urgent care centers. Patient denies fevers/chills, weakness, dizziness, chest pain, shortness of breath, N/V/C/D, abdominal pain, dysuria/frequency, changes in mental status. Otherwise, there has been no change in status. Patient has been taking medication as prescribed and there has been no recent change in medication or diet.  No recent antibiotics.  There has been no recent illness, hospitalizations, travel or sick contacts. 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 Attempted to peform PT evaluation on 09/24/16 however pt with myoclonic jerks, dizziness, and facial droop when attempting ambulation. Head CT negative for acute infarct. Pt is now s/p I&D on 6/15. Received continue upon transfer order. Pt is now NWB on L LE. Pt is now s/p a L 1st ray resection (performed on 6/20). Pt is still NWB on L LE. New orders received for PT.    PT Comments    Pt in bed, ready for therapy.  Participated in exercises as described below.  Pt with new debridement on R foot yesterday with MAC. NWB L.   No new WB orders noted in chart for RLE.  Pt anxious to get up to chair.  Discussed with primary nurse.  Pt was kept NWB bilaterally for transfer and was able to lateral scoot to recliner at bedside with armrest dropped.  He  maintained NWB bilaterally well with verbal cues due to good BUE strength.  Feet elevated on pillow.  CNA in room to assist with transfer and educated on status and technique.  Primary nurse to clarify WB orders.   Follow Up Recommendations  SNF     Equipment Recommendations       Recommendations for Other Services       Precautions / Restrictions Precautions Precautions: Fall Restrictions Weight Bearing Restrictions: Yes LLE Weight Bearing: Non weight bearing Other Position/Activity Restrictions: NWB L, ? NWB R due to new debridement.  Pt kept NWB bilaterally and nursing to clarify.    Mobility  Bed Mobility Overal bed mobility: Needs Assistance Bed Mobility: Supine to Sit     Supine to sit: Supervision Sit to supine: Modified independent (Device/Increase time)   General bed mobility comments: Supervision for safety. Demonstrated good technique. Upright posture noted once EOB with UE's in lap (versus out to side for support).  Transfers Overall transfer level: Needs assistance Equipment used: None             General transfer comment: Pt with lateral scoot transfer to recliner with side arm dropped for BLE  NWB.  Pt able to maintain NWB bilaterally during transfer well using LE's only.  Ambulation/Gait             General Gait Details: Deferred this session.  Awaiting WB clarification.   Stairs            Wheelchair Mobility    Modified Rankin (Stroke  Patients Only)       Balance Overall balance assessment: Needs assistance Sitting-balance support: Feet unsupported Sitting balance-Leahy Scale: Good                                      Cognition Arousal/Alertness: Awake/alert Behavior During Therapy: WFL for tasks assessed/performed Overall Cognitive Status: Within Functional Limits for tasks assessed                                        Exercises Other Exercises Other Exercises: supine 2 x 15 for ankle  pumps, SLR, ab/add, heel slides and SAQ .    General Comments        Pertinent Vitals/Pain Pain Assessment: 0-10 Pain Score: 4  Pain Location: R > L foot Pain Descriptors / Indicators: Sore Pain Intervention(s): Limited activity within patient's tolerance;Monitored during session    Home Living                      Prior Function            PT Goals (current goals can now be found in the care plan section) Progress towards PT goals: Progressing toward goals    Frequency    7X/week      PT Plan Current plan remains appropriate    Co-evaluation              AM-PAC PT "6 Clicks" Daily Activity  Outcome Measure  Difficulty turning over in bed (including adjusting bedclothes, sheets and blankets)?: None Difficulty moving from lying on back to sitting on the side of the bed? : None Difficulty sitting down on and standing up from a chair with arms (e.g., wheelchair, bedside commode, etc,.)?: Total Help needed moving to and from a bed to chair (including a wheelchair)?: A Lot Help needed walking in hospital room?: Total Help needed climbing 3-5 steps with a railing? : Total 6 Click Score: 13    End of Session   Activity Tolerance: Patient tolerated treatment well Patient left: in chair;with call bell/phone within reach Nurse Communication: Mobility status       Time: 0981-1914 PT Time Calculation (min) (ACUTE ONLY): 29 min  Charges:  $Therapeutic Exercise: 8-22 mins $Therapeutic Activity: 8-22 mins                    G Codes:       Chesley Noon, PTA 10/06/16, 10:06 AM

## 2016-10-06 NOTE — Progress Notes (Signed)
Pt called with s/s of nausea and sweating. Vomited x 1. Feels better now. VS stable. BG 123. Notified MD. Recheck BG in 2 hrs. Continue to monitor.

## 2016-10-06 NOTE — Progress Notes (Signed)
Chaplain made a follow visit with pt. Pt was sitting on chair with family and friends at his bedside. Pt introduced his childhood friends to [facility] and talked about things they did together. Pt was delightful and told [place] he had surgery yesterday to clear infected areas on his left foot, he talked about his family reunion today. He also mentioned about his children, grandchildren, work and future. Pt admits that the amputation of his toes has definitely changed his life for ever. He will have to learn how to walk and to do things differently. [place] encouraged pt and prayed with him. [place] to make a follow up with pt as needed.

## 2016-10-06 NOTE — Anesthesia Postprocedure Evaluation (Signed)
Anesthesia Post Note  Patient: Kyle Banks  Procedure(s) Performed: Procedure(s) (LRB): IRRIGATION AND DEBRIDEMENT FOOT (Right)  Patient location during evaluation: PACU Anesthesia Type: General Level of consciousness: awake and alert and oriented Pain management: pain level controlled Vital Signs Assessment: post-procedure vital signs reviewed and stable Respiratory status: spontaneous breathing and respiratory function stable Cardiovascular status: blood pressure returned to baseline Anesthetic complications: no     Last Vitals:  Vitals:   10/06/16 0500 10/06/16 0824  BP: 126/89 123/78  Pulse: (!) 101 93  Resp:  18  Temp:  37.1 C    Last Pain:  Vitals:   10/06/16 0609  TempSrc:   PainSc: 3                  Ebin Palazzi

## 2016-10-06 NOTE — Progress Notes (Signed)
1 Day Post-Op   Subjective/Chief Complaint: Patient seen. Some pain in the right foot over night but overall states he is doing okay.   Objective: Vital signs in last 24 hours: Temp:  [97 F (36.1 C)-98.7 F (37.1 C)] 98.7 F (37.1 C) (06/23 0824) Pulse Rate:  [46-109] 93 (06/23 0824) Resp:  [14-21] 18 (06/23 0824) BP: (99-164)/(48-92) 123/78 (06/23 0824) SpO2:  [94 %-100 %] 94 % (06/23 0824) Last BM Date: 10/05/16  Intake/Output from previous day: 06/22 0701 - 06/23 0700 In: 1200 [P.O.:240; I.V.:810; IV Piggyback:150] Out: 2302 [Urine:2300; Blood:2] Intake/Output this shift: Total I/O In: 240 [P.O.:240] Out: -   The bandages on both feet are dry and intact. No evidence of any strikethrough at this point. White count has reduced down into the normal range.  Lab Results:   Recent Labs  10/04/16 0440 10/06/16 0450  WBC 12.6* 9.8  HGB 8.8* 8.2*  HCT 25.7* 24.2*  PLT 510* 484*   BMET  Recent Labs  10/06/16 0450  NA 133*  K 3.9  CL 102  CO2 28  GLUCOSE 179*  BUN 11  CREATININE 0.93  CALCIUM 8.0*   PT/INR No results for input(s): LABPROT, INR in the last 72 hours. ABG No results for input(s): PHART, HCO3 in the last 72 hours.  Invalid input(s): PCO2, PO2  Studies/Results: No results found.  Anti-infectives: Anti-infectives    Start     Dose/Rate Route Frequency Ordered Stop   10/05/16 1825  vancomycin (VANCOCIN) powder  Status:  Discontinued       As needed 10/05/16 1825 10/05/16 1853   10/03/16 2038  vancomycin (VANCOCIN) powder  Status:  Discontinued       As needed 10/03/16 2039 10/03/16 2136   09/28/16 1500  piperacillin-tazobactam (ZOSYN) IVPB 3.375 g     3.375 g 12.5 mL/hr over 240 Minutes Intravenous Every 8 hours 09/28/16 1415     09/28/16 1400  ceftAZIDime (FORTAZ) 2 gram/50 mL D5W IVPB - DUPLEX  Status:  Discontinued     2 g 100 mL/hr over 30 Minutes Intravenous Every 8 hours 09/28/16 0813 09/28/16 1408   09/28/16 0600  cefTAZidime  (FORTAZ) 2 g in dextrose 5 % 50 mL IVPB  Status:  Discontinued     2 g 100 mL/hr over 30 Minutes Intravenous Every 8 hours 09/27/16 1621 09/28/16 0813   09/26/16 2200  ceftAZIDime (FORTAZ) 2 gram/50 mL D5W IVPB - DUPLEX  Status:  Discontinued     2 g 100 mL/hr over 30 Minutes Intravenous Every 8 hours 09/26/16 0802 09/27/16 1620   09/25/16 2200  ceftAZIDime (FORTAZ) 2 gram/50 mL D5W IVPB - DUPLEX  Status:  Discontinued     2 g 100 mL/hr over 30 Minutes Intravenous Every 8 hours 09/25/16 0828 09/25/16 1950   09/25/16 2200  ceftAZIDime (FORTAZ) 2 gram/50 mL D5W IVPB - DUPLEX  Status:  Discontinued     2 g 100 mL/hr over 30 Minutes Intravenous Every 8 hours 09/25/16 1951 09/25/16 1951   09/25/16 2200  cefTAZidime (FORTAZ) 2 g in dextrose 5 % 50 mL IVPB     2 g 100 mL/hr over 30 Minutes Intravenous Every 8 hours 09/25/16 1952 09/26/16 1334   09/25/16 1600  vancomycin (VANCOCIN) 1,250 mg in sodium chloride 0.9 % 250 mL IVPB  Status:  Discontinued     1,250 mg 166.7 mL/hr over 90 Minutes Intravenous Every 12 hours 09/25/16 1553 09/25/16 1555   09/25/16 1600  vancomycin (VANCOCIN) 1,500 mg in  sodium chloride 0.9 % 500 mL IVPB  Status:  Discontinued     1,500 mg 250 mL/hr over 120 Minutes Intravenous Every 12 hours 09/25/16 1557 09/28/16 1408   09/24/16 1600  cefTAZidime (FORTAZ) 2 g in dextrose 5 % 50 mL IVPB  Status:  Discontinued     2 g 100 mL/hr over 30 Minutes Intravenous Every 8 hours 09/24/16 1530 09/25/16 0828   09/24/16 1530  metroNIDAZOLE (FLAGYL) tablet 500 mg  Status:  Discontinued     500 mg Oral Every 8 hours 09/24/16 1528 09/30/16 1049   09/24/16 0500  piperacillin-tazobactam (ZOSYN) IVPB 3.375 g  Status:  Discontinued     3.375 g 12.5 mL/hr over 240 Minutes Intravenous Every 8 hours 09/23/16 2133 09/24/16 1528   09/24/16 0400  vancomycin (VANCOCIN) IVPB 1000 mg/200 mL premix  Status:  Discontinued     1,000 mg 200 mL/hr over 60 Minutes Intravenous Every 12 hours 09/23/16 2133  09/25/16 1553   09/24/16 0230  piperacillin-tazobactam (ZOSYN) IVPB 3.375 g  Status:  Discontinued     3.375 g 100 mL/hr over 30 Minutes Intravenous  Once 09/24/16 0223 09/24/16 0226   09/24/16 0230  vancomycin (VANCOCIN) IVPB 1000 mg/200 mL premix  Status:  Discontinued     1,000 mg 200 mL/hr over 60 Minutes Intravenous  Once 09/24/16 0223 09/24/16 0227   09/23/16 2130  piperacillin-tazobactam (ZOSYN) IVPB 3.375 g     3.375 g 100 mL/hr over 30 Minutes Intravenous  Once 09/23/16 2119 09/23/16 2203   09/23/16 2130  vancomycin (VANCOCIN) IVPB 1000 mg/200 mL premix     1,000 mg 200 mL/hr over 60 Minutes Intravenous  Once 09/23/16 2119 09/23/16 2247      Assessment/Plan: s/p Procedure(s): IRRIGATION AND DEBRIDEMENT FOOT (Right) Assessment: Stable status post debridement   Plan: Dressings left intact today. Patient may begin weightbearing on the right heel only in his surgical shoe. Plan for a dressing change tomorrow to reassess both wounds.  LOS: 13 days    Durward Fortes 10/06/2016

## 2016-10-07 ENCOUNTER — Encounter: Payer: Self-pay | Admitting: Podiatry

## 2016-10-07 LAB — GLUCOSE, CAPILLARY
GLUCOSE-CAPILLARY: 162 mg/dL — AB (ref 65–99)
GLUCOSE-CAPILLARY: 149 mg/dL — AB (ref 65–99)
Glucose-Capillary: 121 mg/dL — ABNORMAL HIGH (ref 65–99)
Glucose-Capillary: 148 mg/dL — ABNORMAL HIGH (ref 65–99)

## 2016-10-07 LAB — CREATININE, SERUM
Creatinine, Ser: 1.02 mg/dL (ref 0.61–1.24)
GFR calc non Af Amer: >60 mL/min (ref >60)

## 2016-10-07 NOTE — Progress Notes (Signed)
2 Days Post-Op   Subjective/Chief Complaint: Patient seen. States he has had some increased pain in his left foot. Right is doing okay.   Objective: Vital signs in last 24 hours: Temp:  [98 F (36.7 C)-99.1 F (37.3 C)] 98 F (36.7 C) (06/24 0453) Pulse Rate:  [78-89] 78 (06/24 0453) Resp:  [18-19] 19 (06/24 0453) BP: (106-123)/(64-76) 122/69 (06/24 0453) SpO2:  [95 %-97 %] 95 % (06/24 0453) Last BM Date: 10/06/16  Intake/Output from previous day: 06/23 0701 - 06/24 0700 In: 870 [P.O.:720; IV Piggyback:150] Out: 970 [Urine:970] Intake/Output this shift: Total I/O In: 240 [P.O.:240] Out: -   Moderate bleeding is noted on the bandage on the right foot. Upon removal the incision is well coapted with some mild maceration. Still some moderate edema and erythema. No expressible purulence. Still significant drainage on the bandaging on the left foot. Upon removal the incisions still appear to be fairly well coapted although there is some increased skin necrosis along the dorsal midfoot region. A significant amount of expressible blood with some purulence from the forefoot out of the medial wound.  Lab Results:   Recent Labs  10/06/16 0450  WBC 9.8  HGB 8.2*  HCT 24.2*  PLT 484*   BMET  Recent Labs  10/06/16 0450 10/07/16 0535  NA 133*  --   K 3.9  --   CL 102  --   CO2 28  --   GLUCOSE 179*  --   BUN 11  --   CREATININE 0.93 1.02  CALCIUM 8.0*  --    PT/INR No results for input(s): LABPROT, INR in the last 72 hours. ABG No results for input(s): PHART, HCO3 in the last 72 hours.  Invalid input(s): PCO2, PO2  Studies/Results: No results found.  Anti-infectives: Anti-infectives    Start     Dose/Rate Route Frequency Ordered Stop   10/05/16 1825  vancomycin (VANCOCIN) powder  Status:  Discontinued       As needed 10/05/16 1825 10/05/16 1853   10/03/16 2038  vancomycin (VANCOCIN) powder  Status:  Discontinued       As needed 10/03/16 2039 10/03/16 2136   09/28/16 1500  piperacillin-tazobactam (ZOSYN) IVPB 3.375 g     3.375 g 12.5 mL/hr over 240 Minutes Intravenous Every 8 hours 09/28/16 1415     09/28/16 1400  ceftAZIDime (FORTAZ) 2 gram/50 mL D5W IVPB - DUPLEX  Status:  Discontinued     2 g 100 mL/hr over 30 Minutes Intravenous Every 8 hours 09/28/16 0813 09/28/16 1408   09/28/16 0600  cefTAZidime (FORTAZ) 2 g in dextrose 5 % 50 mL IVPB  Status:  Discontinued     2 g 100 mL/hr over 30 Minutes Intravenous Every 8 hours 09/27/16 1621 09/28/16 0813   09/26/16 2200  ceftAZIDime (FORTAZ) 2 gram/50 mL D5W IVPB - DUPLEX  Status:  Discontinued     2 g 100 mL/hr over 30 Minutes Intravenous Every 8 hours 09/26/16 0802 09/27/16 1620   09/25/16 2200  ceftAZIDime (FORTAZ) 2 gram/50 mL D5W IVPB - DUPLEX  Status:  Discontinued     2 g 100 mL/hr over 30 Minutes Intravenous Every 8 hours 09/25/16 0828 09/25/16 1950   09/25/16 2200  ceftAZIDime (FORTAZ) 2 gram/50 mL D5W IVPB - DUPLEX  Status:  Discontinued     2 g 100 mL/hr over 30 Minutes Intravenous Every 8 hours 09/25/16 1951 09/25/16 1951   09/25/16 2200  cefTAZidime (FORTAZ) 2 g in dextrose 5 % 50 mL  IVPB     2 g 100 mL/hr over 30 Minutes Intravenous Every 8 hours 09/25/16 1952 09/26/16 1334   09/25/16 1600  vancomycin (VANCOCIN) 1,250 mg in sodium chloride 0.9 % 250 mL IVPB  Status:  Discontinued     1,250 mg 166.7 mL/hr over 90 Minutes Intravenous Every 12 hours 09/25/16 1553 09/25/16 1555   09/25/16 1600  vancomycin (VANCOCIN) 1,500 mg in sodium chloride 0.9 % 500 mL IVPB  Status:  Discontinued     1,500 mg 250 mL/hr over 120 Minutes Intravenous Every 12 hours 09/25/16 1557 09/28/16 1408   09/24/16 1600  cefTAZidime (FORTAZ) 2 g in dextrose 5 % 50 mL IVPB  Status:  Discontinued     2 g 100 mL/hr over 30 Minutes Intravenous Every 8 hours 09/24/16 1530 09/25/16 0828   09/24/16 1530  metroNIDAZOLE (FLAGYL) tablet 500 mg  Status:  Discontinued     500 mg Oral Every 8 hours 09/24/16 1528 09/30/16  1049   09/24/16 0500  piperacillin-tazobactam (ZOSYN) IVPB 3.375 g  Status:  Discontinued     3.375 g 12.5 mL/hr over 240 Minutes Intravenous Every 8 hours 09/23/16 2133 09/24/16 1528   09/24/16 0400  vancomycin (VANCOCIN) IVPB 1000 mg/200 mL premix  Status:  Discontinued     1,000 mg 200 mL/hr over 60 Minutes Intravenous Every 12 hours 09/23/16 2133 09/25/16 1553   09/24/16 0230  piperacillin-tazobactam (ZOSYN) IVPB 3.375 g  Status:  Discontinued     3.375 g 100 mL/hr over 30 Minutes Intravenous  Once 09/24/16 0223 09/24/16 0226   09/24/16 0230  vancomycin (VANCOCIN) IVPB 1000 mg/200 mL premix  Status:  Discontinued     1,000 mg 200 mL/hr over 60 Minutes Intravenous  Once 09/24/16 0223 09/24/16 0227   09/23/16 2130  piperacillin-tazobactam (ZOSYN) IVPB 3.375 g     3.375 g 100 mL/hr over 30 Minutes Intravenous  Once 09/23/16 2119 09/23/16 2203   09/23/16 2130  vancomycin (VANCOCIN) IVPB 1000 mg/200 mL premix     1,000 mg 200 mL/hr over 60 Minutes Intravenous  Once 09/23/16 2119 09/23/16 2247      Assessment/Plan: s/p Procedure(s): IRRIGATION AND DEBRIDEMENT FOOT (Right) Assessment: Gangrene and abscess is status post multiple debridements. Condition still guarded   Plan: Sterile dressing reapplied to both feet. Discussed with the patient that his left foot is concerning for continued infection and he is still at significant risk for amputation. At this point discussed giving it a couple more days and we will reevaluate on Tuesday. If continued purulence and no sign of improvement recommendation would be for amputation later in the week.  LOS: 14 days    Durward Fortes 10/07/2016

## 2016-10-07 NOTE — Progress Notes (Signed)
Physical Therapy Treatment Patient Details Name: Kyle Banks MRN: [id number] DOB: 1955-05-27 Today's Date: 10/07/2016    History of Present Illness Kyle Banks is a 61 y.o. male with no known medical history presents to the emergency department for evaluation of foot wounds. Patient was in a usual state of health until 3 days ago when he describes the sudden onset of redness, pain, swelling and discharge from his feet bilaterally. He states prior to this infection his feet appeared normal. Patient is unaware of any medical problems such as diabetes and states that he has had annual physicals and blood work at various urgent care centers. Patient denies fevers/chills, weakness, dizziness, chest pain, shortness of breath, N/V/C/D, abdominal pain, dysuria/frequency, changes in mental status. Otherwise, there has been no change in status. Patient has been taking medication as prescribed and there has been no recent change in medication or diet.  No recent antibiotics.  There has been no recent illness, hospitalizations, travel or sick contacts. 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 Attempted to peform PT evaluation on 09/24/16 however pt with myoclonic jerks, dizziness, and facial droop when attempting ambulation. Head CT negative for acute infarct. Pt is now s/p I&D on 6/15. Received continue upon transfer order. Pt is now NWB on L LE. Pt is now s/p a L 1st ray resection (performed on 6/20). Pt is still NWB on L LE. New orders received for PT.  WB heel only on RLE.    PT Comments    Chart reviewed.  Pt WB only on R heel.  Participated in exercises as described below to maintain and increase BLE strength for mobility skills.  Due to NWB LLE and hard cast shoe on RLE with heel only WB, transfer to chair with lateral scoot technique was again used to assist pt out of bed.   Pt was allowed to use RLE with heel only to  assist. Tolerated exercises and transfers well this am without complaints.     Follow Up Recommendations  SNF     Equipment Recommendations       Recommendations for Other Services       Precautions / Restrictions Precautions Precautions: Fall Restrictions Weight Bearing Restrictions: Yes RLE Weight Bearing: Partial weight bearing RLE Partial Weight Bearing Percentage or Pounds: Heel only LLE Weight Bearing: Non weight bearing    Mobility  Bed Mobility Overal bed mobility: Needs Assistance Bed Mobility: Supine to Sit     Supine to sit: Supervision        Transfers Overall transfer level: Needs assistance Equipment used: None Transfers: Lateral/Scoot Transfers          Lateral/Scoot Transfers: Min assist General transfer comment: Pt with lateral scoot transfer to recliner with side arm dropped for BLE  NWB.  Pt able to maintain NWB LLR, heel only RLE for transfer.  Ambulation/Gait                 Stairs            Wheelchair Mobility    Modified Rankin (Stroke Patients Only)       Balance Overall balance assessment: Needs assistance Sitting-balance support: Feet unsupported Sitting balance-Leahy Scale: Good         Standing balance comment: deferred                            Cognition Arousal/Alertness: Awake/alert  Behavior During Therapy: WFL for tasks assessed/performed Overall Cognitive Status: Within Functional Limits for tasks assessed                                        Exercises Other Exercises Other Exercises: supine 2 x 15 for ankle pumps, SLR, ab/add, heel slides, pillow squeeze and SAQ .    General Comments        Pertinent Vitals/Pain Pain Assessment: 0-10 Pain Score: 2  Pain Location: both feet Pain Descriptors / Indicators: Aching Pain Intervention(s): Limited activity within patient's tolerance;RN gave pain meds during session    Home Living                       Prior Function            PT Goals (current goals can now be found in the care plan section) Progress towards PT goals: Progressing toward goals    Frequency    7X/week      PT Plan Current plan remains appropriate    Co-evaluation              AM-PAC PT "6 Clicks" Daily Activity  Outcome Measure  Difficulty turning over in bed (including adjusting bedclothes, sheets and blankets)?: None Difficulty moving from lying on back to sitting on the side of the bed? : None Difficulty sitting down on and standing up from a chair with arms (e.g., wheelchair, bedside commode, etc,.)?: Total Help needed moving to and from a bed to chair (including a wheelchair)?: A Lot Help needed walking in hospital room?: Total Help needed climbing 3-5 steps with a railing? : Total 6 Click Score: 13    End of Session Equipment Utilized During Treatment: Gait belt Activity Tolerance: Patient tolerated treatment well Patient left: in chair;with chair alarm set;with call bell/phone within reach Nurse Communication: Mobility status       Time: 8786-7672 PT Time Calculation (min) (ACUTE ONLY): 29 min  Charges:  $Therapeutic Exercise: 8-22 mins $Therapeutic Activity: 8-22 mins                    G Codes:       Chesley Noon, PTA 10/07/16, 9:24 AM

## 2016-10-07 NOTE — Progress Notes (Signed)
Patient ID: Kyle Banks, male   DOB: Mar 29, 1956, 61 y.o.   MRN: [id number]  [employer] PROGRESS NOTE  Kyle Banks IOX:[id number] DOB: May 13, 1955 DOA: 09/23/2016 PCP: Patient, No Pcp Per  HPI/Subjective: Patient had no further nausea vomiting from yesterday afternoon. He thinks he ate too much food. Patient concerned about his left foot not looking that good. Some pain in his feet.  Objective: Vitals:   10/06/16 2350 10/07/16 0453  BP: 118/64 122/69  Pulse: 89 78  Resp: 18 19  Temp: 98.5 F (36.9 C) 98 F (36.7 C)    Filed Weights   09/24/16 0218 09/26/16 1452 10/02/16 0421  Weight: 99.2 kg (218 lb 9.6 oz) 98.9 kg (218 lb) 109.3 kg (241 lb)    ROS: Review of Systems  Constitutional: Negative for chills and fever.  Eyes: Negative for blurred vision.  Respiratory: Negative for cough and shortness of breath.   Cardiovascular: Negative for chest pain.  Gastrointestinal: Negative for abdominal pain, constipation, diarrhea, nausea and vomiting.  Genitourinary: Negative for dysuria.  Musculoskeletal: Positive for joint pain.  Neurological: Negative for dizziness and headaches.   Exam: Physical Exam  Constitutional: He is oriented to person, place, and time.  HENT:  Nose: No mucosal edema.  Mouth/Throat: No oropharyngeal exudate or posterior oropharyngeal edema.  Eyes: Conjunctivae, EOM and lids are normal. Pupils are equal, round, and reactive to light.  Neck: No JVD present. Carotid bruit is not present. No edema present. No thyroid mass and no thyromegaly present.  Cardiovascular: S1 normal and S2 normal.  Exam reveals no gallop.   No murmur heard. Pulses:      Dorsalis pedis pulses are 2+ on the right side, and 2+ on the left side.  Respiratory: No respiratory distress. He has no wheezes. He has no rhonchi. He has no rales.  GI: Soft. Bowel sounds are normal. There is no tenderness.  Musculoskeletal:       Right ankle: He exhibits swelling.       Left ankle: He  exhibits swelling.  Lymphadenopathy:    He has no cervical adenopathy.  Neurological: He is alert and oriented to person, place, and time. No cranial nerve deficit.  Skin: Skin is warm. No rash noted. Nails show no clubbing.  Psychiatric: He has a normal mood and affect.      Data Reviewed: Basic Metabolic Panel:  Recent Labs Lab 10/06/16 0450 10/07/16 0535  NA 133*  --   K 3.9  --   CL 102  --   CO2 28  --   GLUCOSE 179*  --   BUN 11  --   CREATININE 0.93 1.02  CALCIUM 8.0*  --    CBC:  Recent Labs Lab 10/03/16 2305 10/04/16 0440 10/06/16 0450  WBC 10.5 12.6* 9.8  NEUTROABS 7.0* 8.6*  --   HGB 8.9* 8.8* 8.2*  HCT 26.1* 25.7* 24.2*  MCV 84.7 84.7 84.8  PLT 492* 510* 484*    CBG:  Recent Labs Lab 10/06/16 1338 10/06/16 1705 10/06/16 2106 10/07/16 0739 10/07/16 1158  GLUCAP 123* 117* 137* 162* 149*    Recent Results (from the past 240 hour(s))  MRSA PCR Screening     Status: None   Collection Time: 09/27/16  5:07 PM  Result Value Ref Range Status   MRSA by PCR NEGATIVE NEGATIVE Final    Comment:        The GeneXpert MRSA Assay (FDA approved for NASAL specimens only), is one component of a comprehensive MRSA  colonization surveillance program. It is not intended to diagnose MRSA infection nor to guide or monitor treatment for MRSA infections.   Aerobic/Anaerobic Culture (surgical/deep wound)     Status: None   Collection Time: 09/28/16  4:22 PM  Result Value Ref Range Status   Specimen Description ABSCESS  Final   Special Requests NONE  Final   Gram Stain   Final    MODERATE WBC PRESENT, PREDOMINANTLY PMN RARE SQUAMOUS EPITHELIAL CELLS PRESENT NO ORGANISMS SEEN    Culture   Final    RARE STREPTOCOCCUS ANGINOSIS RARE ENTEROCOCCUS FAECALIS NO ANAEROBES ISOLATED Performed at [facility], [place] [address], [place], [place] [address]    Report Status 10/04/2016 FINAL  Final   Organism ID, Bacteria ENTEROCOCCUS FAECALIS  Final       Susceptibility   Enterococcus faecalis - MIC*    AMPICILLIN <=2 SENSITIVE Sensitive     VANCOMYCIN 1 SENSITIVE Sensitive     GENTAMICIN SYNERGY RESISTANT Resistant     * RARE ENTEROCOCCUS FAECALIS      Scheduled Meds: . aspirin EC  81 mg Oral Daily  . chlorhexidine  60 mL Topical Once  . enoxaparin (LOVENOX) injection  40 mg Subcutaneous Q24H  . fenofibrate  160 mg Oral Daily  . insulin aspart  0-9 Units Subcutaneous TID WC  . insulin aspart  5 Units Subcutaneous TID WC  . insulin glargine  26 Units Subcutaneous Q24H  . insulin starter kit- pen needles  1 kit Other Once  . nutrition supplement (JUVEN)  1 packet Oral BID BM  . protein supplement shake  11 oz Oral Q24H  . senna-docusate  2 tablet Oral BID  . sodium chloride flush  10-40 mL Intracatheter Q12H  . sodium chloride flush  3 mL Intravenous Q12H   Continuous Infusions: . piperacillin-tazobactam (ZOSYN)  IV Stopped (10/07/16 0948)    Assessment/Plan:  1. 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 Group B Streptococcus sepsis. 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 On IV Zosyn through July 13 as per ID note. Has PICC line. Dr. Cleda Mccreedy will evaluate both wounds on Tuesday to determine if further procedures are needed.  High likelihood for amputation for left foot 2. Type 2 diabetes mellitus uncontrolled. Hemoglobin A1c is 15.1. Sugars trended better. Patient on Lantus and sliding scale. Diet exercise and weight loss discussed at length. 3. Likely anemia of chronic disease.  4. Thrombocytosis likely acute phase reactant with infection  Code Status:     Code Status Orders        Start     Ordered   09/24/16 0223  Full code  Continuous     09/24/16 0223    Code Status History    Date Active Date Inactive Code Status Order ID Comments User Context   This patient has a current code status but no historical code status.      Disposition Plan: To be  determined  Consultants:  Infectious disease  Podiatry  Antibiotics:  IV Zosyn  Time spent:  26 minutes  [place], [place]

## 2016-10-08 LAB — CBC WITH DIFFERENTIAL/PLATELET
BASOS ABS: 0 10*3/uL (ref 0–0.1)
Basophils Relative: 0 %
Eosinophils Absolute: 0.1 10*3/uL (ref 0–0.7)
Eosinophils Relative: 1 %
HEMATOCRIT: 23.3 % — AB (ref 40.0–52.0)
HEMOGLOBIN: 7.9 g/dL — AB (ref 13.0–18.0)
LYMPHS PCT: 26 %
Lymphs Abs: 2.1 10*3/uL (ref 1.0–3.6)
MCH: 28.7 pg (ref 26.0–34.0)
MCHC: 33.9 g/dL (ref 32.0–36.0)
MCV: 84.6 fL (ref 80.0–100.0)
Monocytes Absolute: 0.9 10*3/uL (ref 0.2–1.0)
Monocytes Relative: 11 %
NEUTROS ABS: 5 10*3/uL (ref 1.4–6.5)
NEUTROS PCT: 62 %
Platelets: 459 10*3/uL — ABNORMAL HIGH (ref 150–440)
RBC: 2.76 MIL/uL — AB (ref 4.40–5.90)
RDW: 14.3 % (ref 11.5–14.5)
WBC: 8.2 10*3/uL (ref 3.8–10.6)

## 2016-10-08 LAB — GLUCOSE, CAPILLARY
GLUCOSE-CAPILLARY: 147 mg/dL — AB (ref 65–99)
GLUCOSE-CAPILLARY: 97 mg/dL (ref 65–99)
GLUCOSE-CAPILLARY: 153 mg/dL — AB (ref 65–99)
Glucose-Capillary: 148 mg/dL — ABNORMAL HIGH (ref 65–99)

## 2016-10-08 MED ORDER — FERROUS SULFATE 325 (65 FE) MG PO TABS
325.0000 mg | ORAL_TABLET | Freq: Two times a day (BID) | ORAL | Status: DC
  Administered 2016-10-08 – 2016-10-10 (×5): 325 mg via ORAL
  Filled 2016-10-08 (×5): qty 1

## 2016-10-08 NOTE — Progress Notes (Signed)
Physical Therapy Treatment Patient Details Name: Tristain Daily MRN: [id number] DOB: [birth year] Today's Date: 10/08/2016    History of Present Illness Gael Londo is a 61 y.o. male with no known medical history presents to the emergency department for evaluation of foot wounds. Patient was in a usual state of health until 3 days ago when he describes the sudden onset of redness, pain, swelling and discharge from his feet bilaterally. He states prior to this infection his feet appeared normal. Patient is unaware of any medical problems such as diabetes and states that he has had annual physicals and blood work at various urgent care centers. Patient denies fevers/chills, weakness, dizziness, chest pain, shortness of breath, N/V/C/D, abdominal pain, dysuria/frequency, changes in mental status. Otherwise, there has been no change in status. Patient has been taking medication as prescribed and there has been no recent change in medication or diet.  No recent antibiotics.  There has been no recent illness, hospitalizations, travel or sick contacts. 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 Attempted to peform PT evaluation on 09/24/16 however pt with myoclonic jerks, dizziness, and facial droop when attempting ambulation. Head CT negative for acute infarct. Pt is now s/p I&D on 6/15. Received continue upon transfer order. Pt is now NWB on L LE. Pt is now s/p a L 1st ray resection (performed on 6/20). Pt is still NWB on L LE. New orders received for PT. Pt now s/p I&D performed on 10/05/16 on his R foot. Received continue upon transfer order. Pt now WB through R heel only and still NWB on L.    PT Comments    Pt progressing toward his goals. Ambulated from EOB to recliner with min assist and RW. Able to maintain heel only weight bearing precautions on RLE 1 of 3 steps taken. Pt required no assist/cueing to maintain NWB precautions  on LLE. PT assisted pt off bed pan and performed hygiene this session. Pt appropriate for transfer to/from [facility] with min assist, which would be made easier with ortho wedge shoe for RLE. RN notified. Pt's goals have been updated. Pt continues to be very motivated to participate in therapy. Will continue to progress.    Follow Up Recommendations  SNF     Equipment Recommendations  Rolling walker with 5" wheels    Recommendations for Other Services       Precautions / Restrictions Precautions Precautions: Fall Required Braces or Orthoses:  (B post op shoes) Restrictions Weight Bearing Restrictions: Yes RLE Weight Bearing: Partial weight bearing RLE Partial Weight Bearing Percentage or Pounds: Heel only LLE Weight Bearing: Non weight bearing    Mobility  Bed Mobility Overal bed mobility: Needs Assistance Bed Mobility: Supine to Sit     Supine to sit: Supervision     General bed mobility comments: Demonstrated good technique. Upright posture noted once EOB with good seated balance (BUE's in lap). No dizziness noted once EOB.  Transfers Overall transfer level: Needs assistance Equipment used: Rolling walker (2 wheeled) Transfers: Sit to/from Stand Sit to Stand: Min assist         General transfer comment: +1 min assist for sit to/from stand. Moderate verbal cueing to maintaining heel only weight bearing precaution on RLE. Pt able to maintain NWB on LLE without assist. Pt steady on his feet with BUE support on RW.  Ambulation/Gait Ambulation/Gait assistance: Min assist Ambulation Distance (Feet): 2 Feet Assistive device: Rolling walker (2 wheeled) Gait  Pattern/deviations: Trunk flexed;Wide base of support     General Gait Details: Ambulated from EOB to recliner (69ft) with RW and min assist. Verbal cueing for weight bearing precautions on RLE. Pt able to maintain precautions on 3rd step with RLE, but not first two steps attempted. Pt maintained LLE NWB status without  assistance. Pt steady on his feet.   Stairs            Wheelchair Mobility    Modified Rankin (Stroke Patients Only)       Balance Overall balance assessment: No apparent balance deficits (not formally assessed)                                          Cognition Arousal/Alertness: Awake/alert Behavior During Therapy: WFL for tasks assessed/performed Overall Cognitive Status: Within Functional Limits for tasks assessed                                        Exercises Other Exercises Other Exercises: Supine ther-ex x15 B included: ankle pumps, SLR's, and hip abd. Seated ther-ex in recliner x15 B included LAQ's. Performed with supervision. Other Exercises: Pt on bed pan upon arrival to room. Pt able to roll without assistance for PT to remove bed pan. PT performed hygiene since pt used BUE's to hold railing to remain on his side. PT told RN pt is appropriate for [facility].    General Comments        Pertinent Vitals/Pain Pain Assessment: 0-10 Pain Score: 4  Pain Location: both feet Pain Descriptors / Indicators: Operative site guarding;Aching Pain Intervention(s): Limited activity within patient's tolerance;Monitored during session;Repositioned    Home Living                      Prior Function            PT Goals (current goals can now be found in the care plan section) Acute Rehab PT Goals Patient Stated Goal: Return to prior function at home PT Goal Formulation: With patient Time For Goal Achievement: 10/22/16 Potential to Achieve Goals: Good Progress towards PT goals: Progressing toward goals    Frequency    7X/week      PT Plan Current plan remains appropriate    Co-evaluation              AM-PAC PT "6 Clicks" Daily Activity  Outcome Measure  Difficulty turning over in bed (including adjusting bedclothes, sheets and blankets)?: None Difficulty moving from lying on back to sitting on the side of the  bed? : None Difficulty sitting down on and standing up from a chair with arms (e.g., wheelchair, bedside commode, etc,.)?: Total Help needed moving to and from a bed to chair (including a wheelchair)?: A Little Help needed walking in hospital room?: A Lot Help needed climbing 3-5 steps with a railing? : Total 6 Click Score: 15    End of Session Equipment Utilized During Treatment: Gait belt Activity Tolerance: Patient tolerated treatment well Patient left: in chair;with call bell/phone within reach;with nursing/sitter in room Nurse Communication: Mobility status (BSC and post op shoe (ortho wedge)) PT Visit Diagnosis: Unsteadiness on feet (R26.81);Muscle weakness (generalized) (M62.81)     Time: 1761-6073 PT Time Calculation (min) (ACUTE ONLY): 25 min  Charges:  G Codes:       Donaciano Eva, PT, SPT  Marni Griffon 10/08/2016, 10:38 AM

## 2016-10-08 NOTE — Progress Notes (Signed)
Shift assessment completed. Pt is alert and oriented, in no distress, denied pain to feet. Pt is on room air, lungs are clear, Hr is reuglar, abdomen is soft, bs heard. picc line is intact to LUA with dressing dry and intact. Pt is using urinal to void, bilat feet have ace wraps and bulky dressings in place. Since assessment, pt has worked with physical therapy and is oob to chair, has had bmx2.Marland Kitchen

## 2016-10-08 NOTE — Progress Notes (Signed)
Pt states he missed a family reunion on Saturday but was pleased to have his family and friends visit him on Saturday and Sunday. Pt indicates he has stomach discomfort this morning. [place] offered pastoral care support and presence.   10/08/16 1000  Clinical Encounter Type  Visited With Patient  Visit Type Follow-up;Spiritual support  Referral From [place];Other (Comment)

## 2016-10-08 NOTE — Progress Notes (Signed)
Pharmacy Antibiotic Note  Kyle Banks is a 61 y.o. male admitted on 09/23/2016 with osteomyelitis.  ID following. Patient is to received piperacillin/tazobactam through October 26, 2016 per ID. OPAT consult has been completed by pharmacist.  Plan: Continue piperacillin/tazobactam 3.375 g IV q8h EI  Height: 5\' 11"  (180.3 cm) Weight: 241 lb (109.3 kg) IBW/kg (Calculated) : 75.3  Temp (24hrs), Avg:98.7 F (37.1 C), Min:97.7 F (36.5 C), Max:100.2 F (37.9 C)   Recent Labs Lab 10/03/16 2305 10/04/16 0440 10/06/16 0450 10/07/16 0535 10/08/16 0516  WBC 10.5 12.6* 9.8  --  8.2  CREATININE  --   --  0.93 1.02  --     Estimated Creatinine Clearance: 95.6 mL/min (by C-G formula based on SCr of 1.02 mg/dL).    No Known Allergies  Thank you for allowing pharmacy to be a part of this patient's care.  Lenis Noon, Pharm.D., BCPS Clinical Pharmacist 10/08/2016 8:28 AM

## 2016-10-08 NOTE — Progress Notes (Signed)
Patient ID: Kyle Banks, male   DOB: 1955-07-29, 61 y.o.   MRN: [id number]   [facility] PROGRESS NOTE  Kyle Banks [id number] DOB: 09-26-1955 DOA: 09/23/2016 PCP: Patient, No Pcp Per  HPI/Subjective: Patient feels okay. Has some pain in his feet. Patient in good spirits.    Objective: Vitals:   10/08/16 0735 10/08/16 1251  BP: 134/82 132/79  Pulse: 87 90  Resp: 16 18  Temp: 98.3 F (36.8 C) 98.2 F (36.8 C)    Filed Weights   09/24/16 0218 09/26/16 1452 10/02/16 0421  Weight: 99.2 kg (218 lb 9.6 oz) 98.9 kg (218 lb) 109.3 kg (241 lb)    ROS: Review of Systems  Constitutional: Negative for chills and fever.  Eyes: Negative for blurred vision.  Respiratory: Negative for cough and shortness of breath.   Cardiovascular: Negative for chest pain.  Gastrointestinal: Negative for abdominal pain, constipation, diarrhea, nausea and vomiting.  Genitourinary: Negative for dysuria.  Musculoskeletal: Positive for joint pain.  Neurological: Negative for dizziness and headaches.   Exam: Physical Exam  Constitutional: He is oriented to person, place, and time.  HENT:  Nose: No mucosal edema.  Mouth/Throat: No oropharyngeal exudate or posterior oropharyngeal edema.  Eyes: Conjunctivae, EOM and lids are normal. Pupils are equal, round, and reactive to light.  Neck: No JVD present. Carotid bruit is not present. No edema present. No thyroid mass and no thyromegaly present.  Cardiovascular: S1 normal and S2 normal.  Exam reveals no gallop.   No murmur heard. Pulses:      Dorsalis pedis pulses are 2+ on the right side, and 2+ on the left side.  Respiratory: No respiratory distress. He has no wheezes. He has no rhonchi. He has no rales.  GI: Soft. Bowel sounds are normal. There is no tenderness.  Musculoskeletal:       Right ankle: He exhibits swelling.       Left ankle: He exhibits swelling.  Lymphadenopathy:    He has no cervical adenopathy.  Neurological: He is alert and  oriented to person, place, and time. No cranial nerve deficit.  Skin: Skin is warm. No rash noted. Nails show no clubbing.  Feet covered in bandage by podiatrist.  Psychiatric: He has a normal mood and affect.      Data Reviewed: Basic Metabolic Panel:  Recent Labs Lab 10/06/16 0450 10/07/16 0535  NA 133*  --   K 3.9  --   CL 102  --   CO2 28  --   GLUCOSE 179*  --   BUN 11  --   CREATININE 0.93 1.02  CALCIUM 8.0*  --    CBC:  Recent Labs Lab 10/03/16 2305 10/04/16 0440 10/06/16 0450 10/08/16 0516  WBC 10.5 12.6* 9.8 8.2  NEUTROABS 7.0* 8.6*  --  5.0  HGB 8.9* 8.8* 8.2* 7.9*  HCT 26.1* 25.7* 24.2* 23.3*  MCV 84.7 84.7 84.8 84.6  PLT 492* 510* 484* 459*    CBG:  Recent Labs Lab 10/07/16 1158 10/07/16 1707 10/07/16 2101 10/08/16 0733 10/08/16 1137  GLUCAP 149* 121* 148* 148* 147*    Recent Results (from the past 240 hour(s))  Aerobic/Anaerobic Culture (surgical/deep wound)     Status: None   Collection Time: 09/28/16  4:22 PM  Result Value Ref Range Status   Specimen Description ABSCESS  Final   Special Requests NONE  Final   Gram Stain   Final    MODERATE WBC PRESENT, PREDOMINANTLY PMN RARE SQUAMOUS EPITHELIAL CELLS PRESENT NO  ORGANISMS SEEN    Culture   Final    RARE STREPTOCOCCUS ANGINOSIS RARE ENTEROCOCCUS FAECALIS NO ANAEROBES ISOLATED Performed at [facility], [place] [address] [place], [place] [address]    Report Status 10/04/2016 FINAL  Final   Organism ID, Bacteria ENTEROCOCCUS FAECALIS  Final      Susceptibility   Enterococcus faecalis - MIC*    AMPICILLIN <=2 SENSITIVE Sensitive     VANCOMYCIN 1 SENSITIVE Sensitive     GENTAMICIN SYNERGY RESISTANT Resistant     * RARE ENTEROCOCCUS FAECALIS      Scheduled Meds: . aspirin EC  81 mg Oral Daily  . chlorhexidine  60 mL Topical Once  . enoxaparin (LOVENOX) injection  40 mg Subcutaneous Q24H  . fenofibrate  160 mg Oral Daily  . ferrous sulfate  325 mg Oral BID WC  . insulin  aspart  0-9 Units Subcutaneous TID WC  . insulin aspart  5 Units Subcutaneous TID WC  . insulin glargine  26 Units Subcutaneous Q24H  . insulin starter kit- pen needles  1 kit Other Once  . nutrition supplement (JUVEN)  1 packet Oral BID BM  . protein supplement shake  11 oz Oral Q24H  . senna-docusate  2 tablet Oral BID  . sodium chloride flush  10-40 mL Intracatheter Q12H  . sodium chloride flush  3 mL Intravenous Q12H   Continuous Infusions: . piperacillin-tazobactam (ZOSYN)  IV 3.375 g (10/08/16 1412)    Assessment/Plan:  1. 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 Group B Streptococcus sepsis. 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 On IV Zosyn through July 13 as per ID note. Has PICC line.  Case discussed with Dr. Cleda Mccreedy podiatry, the patient has a high likelihood of a BKA on the left side. Consult placed for vascular surgery. 2. Type 2 diabetes mellitus uncontrolled. Hemoglobin A1c is 15.1. Sugars trended better. Patient on Lantus and sliding scale. Diet exercise and weight loss discussed at length. 3. Anemia. Hemoglobin drifting down start ferrous sulfate. 4. Thrombocytosis likely acute phase reactant with infection  Code Status:     Code Status Orders        Start     Ordered   09/24/16 0223  Full code  Continuous     09/24/16 0223    Code Status History    Date Active Date Inactive Code Status Order ID Comments User Context   This patient has a current code status but no historical code status.      Disposition Plan: To be determined  Consultants:  Infectious disease  Podiatry  Antibiotics:  IV Zosyn  Time spent:  24 minutes  [name], [place]

## 2016-10-08 NOTE — Care Management (Signed)
S/P I & D 06/22. Per podiatry plan is to reevaluate wound on Tuesday. If no improvement will proceed with amputation of left foot. Peak at DC.

## 2016-10-08 NOTE — Plan of Care (Signed)
Problem: Urinary Elimination: Goal: Ability to achieve and maintain adequate urine output will improve Outcome: Progressing Pt is progressing toward goals.

## 2016-10-09 LAB — GLUCOSE, CAPILLARY
GLUCOSE-CAPILLARY: 121 mg/dL — AB (ref 65–99)
Glucose-Capillary: 139 mg/dL — ABNORMAL HIGH (ref 65–99)
Glucose-Capillary: 163 mg/dL — ABNORMAL HIGH (ref 65–99)
Glucose-Capillary: 148 mg/dL — ABNORMAL HIGH (ref 65–99)

## 2016-10-09 MED ORDER — ADULT MULTIVITAMIN W/MINERALS CH
1.0000 | ORAL_TABLET | Freq: Every day | ORAL | Status: DC
  Administered 2016-10-09 – 2016-10-10 (×2): 1 via ORAL
  Filled 2016-10-09 (×2): qty 1

## 2016-10-09 NOTE — Progress Notes (Signed)
Patient ID: Kyle Banks, male   DOB: 1955/10/13, 61 y.o.   MRN: [id number]   [facility] PROGRESS NOTE  Kyle Banks [id number] DOB: 05/03/1955 DOA: 09/23/2016 PCP: Patient, No Pcp Per  HPI/Subjective: Patient feels okay. Offers no complaints. Feels well. Appetite good. Only slight pain in his feet   Objective: Vitals:   10/09/16 0411 10/09/16 0859  BP: (!) 113/53 (!) 118/57  Pulse: 88 92  Resp: 18 16  Temp: 98.4 F (36.9 C) 98.6 F (37 C)    Filed Weights   09/24/16 0218 09/26/16 1452 10/02/16 0421  Weight: 99.2 kg (218 lb 9.6 oz) 98.9 kg (218 lb) 109.3 kg (241 lb)    ROS: Review of Systems  Constitutional: Negative for chills and fever.  Eyes: Negative for blurred vision.  Respiratory: Negative for cough and shortness of breath.   Cardiovascular: Negative for chest pain.  Gastrointestinal: Negative for abdominal pain, constipation, diarrhea, nausea and vomiting.  Genitourinary: Negative for dysuria.  Musculoskeletal: Positive for joint pain.  Neurological: Negative for dizziness and headaches.   Exam: Physical Exam  Constitutional: He is oriented to person, place, and time.  HENT:  Nose: No mucosal edema.  Mouth/Throat: No oropharyngeal exudate or posterior oropharyngeal edema.  Eyes: Conjunctivae, EOM and lids are normal. Pupils are equal, round, and reactive to light.  Neck: No JVD present. Carotid bruit is not present. No edema present. No thyroid mass and no thyromegaly present.  Cardiovascular: S1 normal and S2 normal.  Exam reveals no gallop.   No murmur heard. Pulses:      Dorsalis pedis pulses are 2+ on the right side, and 2+ on the left side.  Respiratory: No respiratory distress. He has no wheezes. He has no rhonchi. He has no rales.  GI: Soft. Bowel sounds are normal. There is no tenderness.  Musculoskeletal:       Right ankle: He exhibits swelling.       Left ankle: He exhibits swelling.  Lymphadenopathy:    He has no cervical adenopathy.   Neurological: He is alert and oriented to person, place, and time. No cranial nerve deficit.  Skin: Skin is warm. No rash noted. Nails show no clubbing.  Feet covered in bandage by podiatrist.  Psychiatric: He has a normal mood and affect.      Data Reviewed: Basic Metabolic Panel:  Recent Labs Lab 10/06/16 0450 10/07/16 0535  NA 133*  --   K 3.9  --   CL 102  --   CO2 28  --   GLUCOSE 179*  --   BUN 11  --   CREATININE 0.93 1.02  CALCIUM 8.0*  --    CBC:  Recent Labs Lab 10/03/16 2305 10/04/16 0440 10/06/16 0450 10/08/16 0516  WBC 10.5 12.6* 9.8 8.2  NEUTROABS 7.0* 8.6*  --  5.0  HGB 8.9* 8.8* 8.2* 7.9*  HCT 26.1* 25.7* 24.2* 23.3*  MCV 84.7 84.7 84.8 84.6  PLT 492* 510* 484* 459*    CBG:  Recent Labs Lab 10/08/16 1137 10/08/16 1644 10/08/16 2106 10/09/16 0825 10/09/16 1134  GLUCAP 147* 97 153* 139* 163*       Scheduled Meds: . aspirin EC  81 mg Oral Daily  . chlorhexidine  60 mL Topical Once  . enoxaparin (LOVENOX) injection  40 mg Subcutaneous Q24H  . fenofibrate  160 mg Oral Daily  . ferrous sulfate  325 mg Oral BID WC  . insulin aspart  0-9 Units Subcutaneous TID WC  . insulin aspart  5 Units Subcutaneous TID WC  . insulin glargine  26 Units Subcutaneous Q24H  . insulin starter kit- pen needles  1 kit Other Once  . multivitamin with minerals  1 tablet Oral Daily  . nutrition supplement (JUVEN)  1 packet Oral BID BM  . protein supplement shake  11 oz Oral Q24H  . senna-docusate  2 tablet Oral BID  . sodium chloride flush  10-40 mL Intracatheter Q12H  . sodium chloride flush  3 mL Intravenous Q12H   Continuous Infusions: . piperacillin-tazobactam (ZOSYN)  IV Stopped (10/09/16 1003)    Assessment/Plan:  1. 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 Group B Streptococcus sepsis. 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 On IV Zosyn through July 13 as per ID note. Has PICC line.  Case discussed  with Dr. Cleda Mccreedy podiatry, the patient Can go to rehabilitation and they will follow-up next week as outpatient. Still has a high likelihood of amputation but he would like to give him the benefit of trying to keep his foot. Awaiting insurance approval for rehabilitation. 2. Type 2 diabetes mellitus uncontrolled. Hemoglobin A1c is 15.1. Sugars trended better. Patient on Lantus and sliding scale. Diet exercise and weight loss discussed again. 3. Anemia. Hemoglobin drifting down start ferrous sulfate. 4. Thrombocytosis likely acute phase reactant with infection  Code Status:     Code Status Orders        Start     Ordered   09/24/16 0223  Full code  Continuous     09/24/16 0223    Code Status History    Date Active Date Inactive Code Status Order ID Comments User Context   This patient has a current code status but no historical code status.      Disposition Plan: To Rehabilitation once insurance company approves  Consultants:  Infectious disease  Podiatry  Antibiotics:  IV Zosyn  Time spent:  22 minutes  [place], [place]

## 2016-10-09 NOTE — Progress Notes (Signed)
Nutrition Follow Up Note   DOCUMENTATION CODES:   Obesity unspecified  INTERVENTION:   Premier Protein po once daily, each supplement provides 160 kcal and 30 grams of protein.  Juven po BID to promote wound healing, each supplement provides 80 kcal, 14 grams of amino acids. Each packet of Juven also provides 300 mg of Vitamin C, 9.5 mg of zinc, and other vitamins and minerals important in wound healing.  MVI  NUTRITION DIAGNOSIS:   Increased nutrient needs related to wound healing as evidenced by increased estimated needs from protein.  GOAL:   Patient will meet greater than or equal to 90% of their needs   MONITOR:   PO intake, Supplement acceptance, Labs, Weight trends, Skin, I & O's  ASSESSMENT:   61 year old male with no known medical history presented for evaluation of food wounds and was found to have sepsis secondary to cellulitis/osteomyelitis of bilateral great toes, new onset diabetes.    Pt doing well. Eating 100% of meals. Getting supplements. No new weight documented. Plan for pt to discharge to SNF. Pt at risk for further amputations.   -Pt s/p amputations of left and right great toes on 6/11. -Pt s/p angiogram with intervention on 09/26/2016. -Pt s/p I&D on 6/15 and placement of wound VAC on left foot. -I & D left foot 6/20 with wound vacc -PICC 6/21 -I & D right foot 6/22  Medications reviewed and include: aspirin, lovenox, fenofibrate, ferous sulfate, insulin, senokot, zosyn, oxycodone   Labs reviewed: Hgb 7.9(L), Hct 23.3(L)  Diet Order:  Diet Carb Modified Fluid consistency: Thin; Room service appropriate? Yes  Skin:  Wound (see comment) (s/p amputations b/l great toes, closed incisions b/l feet)  Last BM:  6/25  Height:   Ht Readings from Last 1 Encounters:  10/02/16 5\' 11"  (1.803 m)    Weight:   Wt Readings from Last 1 Encounters:  10/02/16 241 lb (109.3 kg)    Ideal Body Weight:  78.2 kg  BMI:  Body mass index is 33.61  kg/m.  Estimated Nutritional Needs:   Kcal:  2115-2310 (MSJ x 1.1-1.2)  Protein:  110-130 grams (1-1.2 grams/kg)  Fluid:  2.3 L/day (30 ml/kg IBW)  EDUCATION NEEDS:   Education needs addressed  Koleen Distance MS, RD, LDN Pager #- [phone]

## 2016-10-09 NOTE — Progress Notes (Signed)
Physical Therapy Treatment Patient Details Name: Kyle Banks MRN: [id number] DOB: Apr 17, 1955 Today's Date: 10/09/2016    History of Present Illness Kyle Banks is a 61 y.o. male with no known medical history presents to the emergency department for evaluation of foot wounds. Patient was in a usual state of health until 3 days ago when he describes the sudden onset of redness, pain, swelling and discharge from his feet bilaterally. He states prior to this infection his feet appeared normal. Patient is unaware of any medical problems such as diabetes and states that he has had annual physicals and blood work at various urgent care centers. Patient denies fevers/chills, weakness, dizziness, chest pain, shortness of breath, N/V/C/D, abdominal pain, dysuria/frequency, changes in mental status. Otherwise, there has been no change in status. Patient has been taking medication as prescribed and there has been no recent change in medication or diet.  No recent antibiotics.  There has been no recent illness, hospitalizations, travel or sick contacts. 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 Attempted to peform PT evaluation on 09/24/16 however pt with myoclonic jerks, dizziness, and facial droop when attempting ambulation. Head CT negative for acute infarct. Pt is now s/p I&D on 6/15. Received continue upon transfer order. Pt is now NWB on L LE. Pt is now s/p a L 1st ray resection (performed on 6/20). Pt is still NWB on L LE. New orders received for PT. Pt now s/p I&D performed on 10/05/16 on his R foot. Received continue upon transfer order. Pt now WB through R heel only and still NWB on L.    PT Comments    Pt progressing well toward his goals. Ambulated from EOB to recliner with RW and min assist. Pt better able to maintain his weight bearing precautions this session - required min verbal cueing. Post op shoes donned prior to  ambulation, however pt would benefit from an ortho wedge for his RLE to ease the difficulty of transferring while maintaining his WB precautions. Pt complained of his bottom being "raw" this session. Pt educated to perform pressure relieving techniques 42min every hour (either chair press ups or leaning to one side). Pt states RN aware of this complaint and she has been applying gel PRN. Will continue to progress.    Follow Up Recommendations  SNF     Equipment Recommendations  Rolling walker with 5" wheels    Recommendations for Other Services       Precautions / Restrictions Precautions Precautions: Fall Required Braces or Orthoses:  (B post op shoes) Restrictions Weight Bearing Restrictions: Yes RLE Weight Bearing: Partial weight bearing RLE Partial Weight Bearing Percentage or Pounds: heel LLE Weight Bearing: Non weight bearing    Mobility  Bed Mobility Overal bed mobility: Needs Assistance Bed Mobility: Supine to Sit     Supine to sit: Modified independent (Device/Increase time)     General bed mobility comments: Good technique and upright posture noted once EOB. Denies dizziness/lightheadedness. ModI due to increased time with transfer.  Transfers Overall transfer level: Needs assistance Equipment used: Rolling walker (2 wheeled) Transfers: Sit to/from Stand Sit to Stand: Min assist         General transfer comment: +1 min assist for sit to/from stand. Pt steady once standing with no dizziness noted. Pt better able to maintain weight bearing precautions this session, however pt would benefit from an ortho wedge shoe on his RLE to assist heel only weight  bearing.   Ambulation/Gait Ambulation/Gait assistance: Min assist Ambulation Distance (Feet): 2 Feet Assistive device: Rolling walker (2 wheeled) Gait Pattern/deviations: Trunk flexed;Wide base of support     General Gait Details: Ambulated from EOB to recliner (21ft) with RW and min assist. Verbal cueing for  weight bearing precautions, which pt was better able to follow this session. Pt steady on his feet. Post op shoes donned prior to weight bearing and ambulation.   Stairs            Wheelchair Mobility    Modified Rankin (Stroke Patients Only)       Balance Overall balance assessment: No apparent balance deficits (not formally assessed)                                          Cognition Arousal/Alertness: Awake/alert Behavior During Therapy: WFL for tasks assessed/performed Overall Cognitive Status: Within Functional Limits for tasks assessed                                        Exercises Other Exercises Other Exercises: Supine ther-ex x15 B included: ankle pumps, SLR's, heel slides (keeping heel off bed), and hip abd. Seated ther-ex in recliner x15 B included LAQ's and chair press ups (pressure relief). Performed with supervision. Pt educated to perform chair press ups every hour for 27min per side for pressure relief. Pt complained his bottom felt "raw" this session.     General Comments        Pertinent Vitals/Pain Pain Assessment: Faces Faces Pain Scale: Hurts a little bit Pain Location: both feet Pain Descriptors / Indicators: Operative site guarding;Aching Pain Intervention(s): Limited activity within patient's tolerance;Monitored during session;Repositioned;Premedicated before session    Home Living                      Prior Function            PT Goals (current goals can now be found in the care plan section) Acute Rehab PT Goals Patient Stated Goal: Return to prior function at home PT Goal Formulation: With patient Time For Goal Achievement: 10/22/16 Potential to Achieve Goals: Good Progress towards PT goals: Progressing toward goals    Frequency    7X/week      PT Plan Current plan remains appropriate    Co-evaluation              AM-PAC PT "6 Clicks" Daily Activity  Outcome Measure   Difficulty turning over in bed (including adjusting bedclothes, sheets and blankets)?: None Difficulty moving from lying on back to sitting on the side of the bed? : None Difficulty sitting down on and standing up from a chair with arms (e.g., wheelchair, bedside commode, etc,.)?: Total Help needed moving to and from a bed to chair (including a wheelchair)?: A Little Help needed walking in hospital room?: A Lot Help needed climbing 3-5 steps with a railing? : Total 6 Click Score: 15    End of Session Equipment Utilized During Treatment: Gait belt Activity Tolerance: Patient tolerated treatment well Patient left: in chair;with call bell/phone within reach Nurse Communication: Mobility status PT Visit Diagnosis: Unsteadiness on feet (R26.81);Muscle weakness (generalized) (M62.81)     Time: 3785-8850 PT Time Calculation (min) (ACUTE ONLY): 26 min  Charges:  G Codes:       Donaciano Eva, PT, SPT  Marni Griffon 10/09/2016, 10:58 AM

## 2016-10-09 NOTE — Progress Notes (Signed)
Patient alert and oriented. Complaining of minimal pain. Receiving IV ABX. PICC dressing changed. Reoriented to call bell system.  Deri Fuelling, RN

## 2016-10-09 NOTE — Progress Notes (Signed)
4 Days Post-Op   Subjective/Chief Complaint: Patient seen. States the left foot is feeling better today. Overall doing well.   Objective: Vital signs in last 24 hours: Temp:  [97.8 F (36.6 C)-99.1 F (37.3 C)] 98.4 F (36.9 C) (06/26 0411) Pulse Rate:  [88-103] 88 (06/26 0411) Resp:  [16-18] 18 (06/26 0411) BP: (110-133)/(53-81) 113/53 (06/26 0411) SpO2:  [93 %-100 %] 93 % (06/26 0411) Last BM Date: 10/08/16  Intake/Output from previous day: 06/25 0701 - 06/26 0700 In: 540 [P.O.:240; IV Piggyback:300] Out: 2025 [Urine:2025] Intake/Output this shift: No intake/output data recorded.  Still moderate bleeding on the bandage on the left foot. Upon removal the erythema and edema is somewhat improved. Significant dry eschar along the dorsal aspect of the foot. Much less expressible fluid from the forefoot today. Some mild necrosis around the proximal aspect of the medial incision. Moderate bleeding is also noted on the bandaging on the right foot. The incision is well coapted with some expressible blood but no significant purulence.  Lab Results:   Recent Labs  10/08/16 0516  WBC 8.2  HGB 7.9*  HCT 23.3*  PLT 459*   BMET  Recent Labs  10/07/16 0535  CREATININE 1.02   PT/INR No results for input(s): LABPROT, INR in the last 72 hours. ABG No results for input(s): PHART, HCO3 in the last 72 hours.  Invalid input(s): PCO2, PO2  Studies/Results: No results found.  Anti-infectives: Anti-infectives    Start     Dose/Rate Route Frequency Ordered Stop   10/05/16 1825  vancomycin (VANCOCIN) powder  Status:  Discontinued       As needed 10/05/16 1825 10/05/16 1853   10/03/16 2038  vancomycin (VANCOCIN) powder  Status:  Discontinued       As needed 10/03/16 2039 10/03/16 2136   09/28/16 1500  piperacillin-tazobactam (ZOSYN) IVPB 3.375 g     3.375 g 12.5 mL/hr over 240 Minutes Intravenous Every 8 hours 09/28/16 1415     09/28/16 1400  ceftAZIDime (FORTAZ) 2 gram/50 mL D5W  IVPB - DUPLEX  Status:  Discontinued     2 g 100 mL/hr over 30 Minutes Intravenous Every 8 hours 09/28/16 0813 09/28/16 1408   09/28/16 0600  cefTAZidime (FORTAZ) 2 g in dextrose 5 % 50 mL IVPB  Status:  Discontinued     2 g 100 mL/hr over 30 Minutes Intravenous Every 8 hours 09/27/16 1621 09/28/16 0813   09/26/16 2200  ceftAZIDime (FORTAZ) 2 gram/50 mL D5W IVPB - DUPLEX  Status:  Discontinued     2 g 100 mL/hr over 30 Minutes Intravenous Every 8 hours 09/26/16 0802 09/27/16 1620   09/25/16 2200  ceftAZIDime (FORTAZ) 2 gram/50 mL D5W IVPB - DUPLEX  Status:  Discontinued     2 g 100 mL/hr over 30 Minutes Intravenous Every 8 hours 09/25/16 0828 09/25/16 1950   09/25/16 2200  ceftAZIDime (FORTAZ) 2 gram/50 mL D5W IVPB - DUPLEX  Status:  Discontinued     2 g 100 mL/hr over 30 Minutes Intravenous Every 8 hours 09/25/16 1951 09/25/16 1951   09/25/16 2200  cefTAZidime (FORTAZ) 2 g in dextrose 5 % 50 mL IVPB     2 g 100 mL/hr over 30 Minutes Intravenous Every 8 hours 09/25/16 1952 09/26/16 1334   09/25/16 1600  vancomycin (VANCOCIN) 1,250 mg in sodium chloride 0.9 % 250 mL IVPB  Status:  Discontinued     1,250 mg 166.7 mL/hr over 90 Minutes Intravenous Every 12 hours 09/25/16 1553 09/25/16 1555  09/25/16 1600  vancomycin (VANCOCIN) 1,500 mg in sodium chloride 0.9 % 500 mL IVPB  Status:  Discontinued     1,500 mg 250 mL/hr over 120 Minutes Intravenous Every 12 hours 09/25/16 1557 09/28/16 1408   09/24/16 1600  cefTAZidime (FORTAZ) 2 g in dextrose 5 % 50 mL IVPB  Status:  Discontinued     2 g 100 mL/hr over 30 Minutes Intravenous Every 8 hours 09/24/16 1530 09/25/16 0828   09/24/16 1530  metroNIDAZOLE (FLAGYL) tablet 500 mg  Status:  Discontinued     500 mg Oral Every 8 hours 09/24/16 1528 09/30/16 1049   09/24/16 0500  piperacillin-tazobactam (ZOSYN) IVPB 3.375 g  Status:  Discontinued     3.375 g 12.5 mL/hr over 240 Minutes Intravenous Every 8 hours 09/23/16 2133 09/24/16 1528   09/24/16  0400  vancomycin (VANCOCIN) IVPB 1000 mg/200 mL premix  Status:  Discontinued     1,000 mg 200 mL/hr over 60 Minutes Intravenous Every 12 hours 09/23/16 2133 09/25/16 1553   09/24/16 0230  piperacillin-tazobactam (ZOSYN) IVPB 3.375 g  Status:  Discontinued     3.375 g 100 mL/hr over 30 Minutes Intravenous  Once 09/24/16 0223 09/24/16 0226   09/24/16 0230  vancomycin (VANCOCIN) IVPB 1000 mg/200 mL premix  Status:  Discontinued     1,000 mg 200 mL/hr over 60 Minutes Intravenous  Once 09/24/16 0223 09/24/16 0227   09/23/16 2130  piperacillin-tazobactam (ZOSYN) IVPB 3.375 g     3.375 g 100 mL/hr over 30 Minutes Intravenous  Once 09/23/16 2119 09/23/16 2203   09/23/16 2130  vancomycin (VANCOCIN) IVPB 1000 mg/200 mL premix     1,000 mg 200 mL/hr over 60 Minutes Intravenous  Once 09/23/16 2119 09/23/16 2247      Assessment/Plan: s/p Procedure(s): IRRIGATION AND DEBRIDEMENT FOOT (Right) Assessment: Gas gangrene status post multiple debridements, condition guarded but stable   Plan: Dry sterile dressings applied to both the left and right foot. Discussed with the patient that he is still at significant risk for amputation of the left lower extremity but that the foot looks stable enough that at this point I think we can have him transferred to skilled nursing and we'll give it a week to reevaluate. Plan for follow-up in 1 week with Dr. Vickki Muff as I will be out of town  LOS: 16 days    Durward Fortes 10/09/2016

## 2016-10-09 NOTE — Progress Notes (Signed)
Per Kyle Banks [facility] liaison a new [employer] authorization will have to be received before patient can admit to [facility]. Per Kyle Banks he started new [employer] authorization today. MD and RN aware of above. Clinical Social Worker (CSW) will continue to follow and assist as needed.   [employer], LCSW [phone]

## 2016-10-09 NOTE — Progress Notes (Signed)
CH made a follow up visit with pt. Pt was sitting on the chair and had a family friend at the bedside. Pt states that dc recommended he stays 5 more days to allow is left foot to heal. Pt appeared calm, optimistic, and hopeful about the possibilities of getting better before going to Rehab. Pt states he has support sytem and is grateful for the exceptional medical team and chaplain that are working with him. Upon request by pt, Ch offered prayers for pt, and plans to have a follow up with pt as needed.

## 2016-10-10 LAB — BASIC METABOLIC PANEL
Anion gap: 4 — ABNORMAL LOW (ref 5–15)
BUN: 16 mg/dL (ref 6–20)
CALCIUM: 9 mg/dL (ref 8.9–10.3)
CO2: 28 mmol/L (ref 22–32)
CREATININE: 1.01 mg/dL (ref 0.61–1.24)
Chloride: 103 mmol/L (ref 101–111)
GFR calc non Af Amer: >60 mL/min (ref >60)
Glucose, Bld: 127 mg/dL — ABNORMAL HIGH (ref 65–99)
Potassium: 3.8 mmol/L (ref 3.5–5.1)
SODIUM: 135 mmol/L (ref 135–145)

## 2016-10-10 LAB — CBC
HEMATOCRIT: 24.1 % — AB (ref 40.0–52.0)
Hemoglobin: 8.1 g/dL — ABNORMAL LOW (ref 13.0–18.0)
MCH: 28 pg (ref 26.0–34.0)
MCHC: 33.4 g/dL (ref 32.0–36.0)
MCV: 84 fL (ref 80.0–100.0)
Platelets: 431 10*3/uL (ref 150–440)
RBC: 2.87 MIL/uL — ABNORMAL LOW (ref 4.40–5.90)
RDW: 14.3 % (ref 11.5–14.5)
WBC: 6.5 10*3/uL (ref 3.8–10.6)

## 2016-10-10 LAB — GLUCOSE, CAPILLARY
GLUCOSE-CAPILLARY: 139 mg/dL — AB (ref 65–99)
Glucose-Capillary: 124 mg/dL — ABNORMAL HIGH (ref 65–99)
Glucose-Capillary: 148 mg/dL — ABNORMAL HIGH (ref 65–99)

## 2016-10-10 MED ORDER — ASPIRIN 81 MG PO TBEC: 81.0000 mg | DELAYED_RELEASE_TABLET | Freq: Every day | ORAL | 0 refills | Status: AC

## 2016-10-10 MED ORDER — INSULIN GLARGINE 100 UNIT/ML ~~LOC~~ SOLN: 26.0000 [IU] | SUBCUTANEOUS | 0 refills | Status: DC

## 2016-10-10 MED ORDER — OXYCODONE-ACETAMINOPHEN 5-325 MG PO TABS: 1.0000 | ORAL_TABLET | ORAL | 0 refills | Status: DC | PRN

## 2016-10-10 MED ORDER — INSULIN ASPART 100 UNIT/ML ~~LOC~~ SOLN: 5.0000 [IU] | Freq: Three times a day (TID) | SUBCUTANEOUS | 0 refills | Status: DC

## 2016-10-10 MED ORDER — FERROUS SULFATE 325 (65 FE) MG PO TABS: 325.0000 mg | ORAL_TABLET | Freq: Two times a day (BID) | ORAL | 0 refills | Status: DC

## 2016-10-10 MED ORDER — ENOXAPARIN SODIUM 40 MG/0.4ML ~~LOC~~ SOLN: 40.0000 mg | SUBCUTANEOUS | 0 refills | Status: DC

## 2016-10-10 MED ORDER — PREMIER PROTEIN SHAKE: 11.0000 [oz_av] | ORAL | 0 refills | Status: DC

## 2016-10-10 MED ORDER — SENNOSIDES-DOCUSATE SODIUM 8.6-50 MG PO TABS: 2.0000 | ORAL_TABLET | Freq: Two times a day (BID) | ORAL | 0 refills | Status: DC

## 2016-10-10 MED ORDER — JUVEN PO PACK: 1.0000 | PACK | Freq: Two times a day (BID) | ORAL | 0 refills | Status: DC

## 2016-10-10 MED ORDER — PIPERACILLIN-TAZOBACTAM 3.375 G IVPB: INTRAVENOUS | Status: DC

## 2016-10-10 MED ORDER — FENOFIBRATE 160 MG PO TABS: 160.0000 mg | ORAL_TABLET | Freq: Every day | ORAL | 0 refills | Status: DC

## 2016-10-10 NOTE — Discharge Summary (Signed)
[facility] at [place] NAME: Kelley Knoth    MR#:  [id number]  DATE OF BIRTH:  05-03-1955  DATE OF ADMISSION:  09/23/2016 ADMITTING PHYSICIAN: Harvie Bridge, DO  DATE OF DISCHARGE: 10/10/2016  PRIMARY CARE PHYSICIAN: Patient, No Pcp Per    ADMISSION DIAGNOSIS:  Diabetes mellitus, new onset ([place]) [E11.9] Sepsis, due to unspecified organism ([place]) [A41.9] Cellulitis, unspecified cellulitis site [L03.90]  DISCHARGE DIAGNOSIS:  Active Problems:   Sepsis due to cellulitis [facility])    HOSPITAL COURSE:   1.  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 Group B streptococcus sepsis. 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 As per infectious disease patient will be on IV Zosyn through PICC line through July 13. PICC line care needed. Follow-up with Dr. Cleda Mccreedy podiatry on a weekly basis to determine on whether further amputations are needed. Dr. Cleda Mccreedy will be out of the office of Dr. Vickki Muff can follow him up next week.  Follow-up with Dr. Ola Spurr infectious disease before antibiotic courses up 2. Type 2 diabetes mellitus uncontrolled on presentation. Hemoglobin A1c is 15.1. Sugars have trended a lot better. Patient started on Lantus and short acting insulin prior to meals. Diet exercise and weight loss discussed 3. Anemia. Hemoglobin drifting down. Started ferrous sulfate. 4. Thrombocytosis likely acute phase reactant with infection 5. Lovenox injections until more ambulatory. 6. PICC line care.   DISCHARGE CONDITIONS:   Satisfactory  CONSULTS OBTAINED:  Treatment Team:  Leonel Ramsay, MD Samara Deist, DPM Schnier, Dolores Lory, MD  DRUG ALLERGIES:  No Known Allergies  DISCHARGE MEDICATIONS:   Current Discharge Medication List    START taking these medications   Details  aspirin EC 81 MG EC tablet Take 1 tablet (81 mg total) by mouth daily. Qty: 30 tablet, Refills: 0    enoxaparin  (LOVENOX) 40 MG/0.4ML injection Inject 0.4 mLs (40 mg total) into the skin daily. Qty: 30 Syringe, Refills: 0    fenofibrate 160 MG tablet Take 1 tablet (160 mg total) by mouth daily. Qty: 30 tablet, Refills: 0    ferrous sulfate 325 (65 FE) MG tablet Take 1 tablet (325 mg total) by mouth 2 (two) times daily with a meal. Qty: 60 tablet, Refills: 0    insulin aspart (NOVOLOG) 100 UNIT/ML injection Inject 5 Units into the skin 3 (three) times daily with meals. Qty: 10 mL, Refills: 0    insulin glargine (LANTUS) 100 UNIT/ML injection Inject 0.26 mLs (26 Units total) into the skin daily. Qty: 10 mL, Refills: 0    nutrition supplement, JUVEN, (JUVEN) PACK Take 1 packet by mouth 2 (two) times daily between meals. Qty: 60 packet, Refills: 0    oxyCODONE-acetaminophen (PERCOCET/ROXICET) 5-325 MG tablet Take 1 tablet by mouth every 4 (four) hours as needed for severe pain. Qty: 20 tablet, Refills: 0    piperacillin-tazobactam (ZOSYN) 3.375 GM/50ML IVPB 3.375 gm iv every eight hours through July 13th Qty: 50 mL    protein supplement shake (PREMIER PROTEIN) LIQD Take 325 mLs (11 oz total) by mouth daily. Qty: 30 Can, Refills: 0    senna-docusate (SENOKOT-S) 8.6-50 MG tablet Take 2 tablets by mouth 2 (two) times daily. Qty: 60 tablet, Refills: 0      CONTINUE these medications which have NOT CHANGED   Details  cyanocobalamin 500 MCG tablet Take 500 mcg by mouth daily.    Garlic 10 MG CAPS Take 1 tablet by mouth daily.  Omega-3 Fatty Acids (FISH OIL) 1000 MG CAPS Take 1 capsule by mouth daily.         DISCHARGE INSTRUCTIONS:   Follow-up with Dr. Cleda Mccreedy podiatry on a weekly basis Follow-up with medical doctor at facility Follow-up with  If you experience worsening of your admission symptoms, develop shortness of breath, life threatening emergency, suicidal or homicidal thoughts you must seek medical attention immediately by calling 911 or calling your MD immediately  if symptoms  less severe.  You Must read complete instructions/literature along with all the possible adverse reactions/side effects for all the Medicines you take and that have been prescribed to you. Take any new Medicines after you have completely understood and accept all the possible adverse reactions/side effects.   Please note  You were cared for by a hospitalist during your hospital stay. If you have any questions about your discharge medications or the care you received while you were in the hospital after you are discharged, you can call the unit and asked to speak with the hospitalist on call if the hospitalist that took care of you is not available. Once you are discharged, your primary care physician will handle any further medical issues. Please note that NO REFILLS for any discharge medications will be authorized once you are discharged, as it is imperative that you return to your primary care physician (or establish a relationship with a primary care physician if you do not have one) for your aftercare needs so that they can reassess your need for medications and monitor your lab values.    Today   CHIEF COMPLAINT:   Chief Complaint  Patient presents with  . Dizziness  . Wound Infection    HISTORY OF PRESENT ILLNESS:  Darrold Bezek  is a 61 y.o. male presented with feet wounds   VITAL SIGNS:  Blood pressure 138/81, pulse 78, temperature 98.7 F (37.1 C), temperature source Oral, resp. rate 18, height 5\' 11"  (1.803 m), weight 109.3 kg (241 lb), SpO2 100 %.    PHYSICAL EXAMINATION:  GENERAL:  61 y.o.-year-old patient lying in the bed with no acute distress.  EYES: Pupils equal, round, reactive to light and accommodation. No scleral icterus. Extraocular muscles intact.  HEENT: Head atraumatic, normocephalic. Oropharynx and nasopharynx clear.  NECK:  Supple, no jugular venous distention. No thyroid enlargement, no tenderness.  LUNGS: Normal breath sounds bilaterally, no wheezing,  rales,rhonchi or crepitation. No use of accessory muscles of respiration.  CARDIOVASCULAR: S1, S2 normal. No murmurs, rubs, or gallops.  ABDOMEN: Soft, non-tender, non-distended. Bowel sounds present. No organomegaly or mass.  EXTREMITIES: No pedal edema, cyanosis, or clubbing.  NEUROLOGIC: Cranial nerves II through XII are intact. Muscle strength 5/5 in all extremities. Sensation intact. Gait not checked.  PSYCHIATRIC: The patient is alert and oriented x 3.  SKIN: Large dressings bilateral feet  DATA REVIEW:   CBC  Recent Labs Lab 10/10/16 0451  WBC 6.5  HGB 8.1*  HCT 24.1*  PLT 431    Chemistries   Recent Labs Lab 10/10/16 0451  NA 135  K 3.8  CL 103  CO2 28  GLUCOSE 127*  BUN 16  CREATININE 1.01  CALCIUM 9.0    Microbiology Results  Results for orders placed or performed during the hospital encounter of 09/23/16  Blood culture (routine x 2)     Status: Abnormal   Collection Time: 09/23/16  7:44 PM  Result Value Ref Range Status   Specimen Description BLOOD LEFT ANTECUBITAL  Final  Special Requests   Final    BOTTLES DRAWN AEROBIC AND ANAEROBIC Blood Culture adequate volume   Culture  Setup Time   Final    GRAM POSITIVE COCCI IN BOTH AEROBIC AND ANAEROBIC BOTTLES CRITICAL RESULT CALLED TO, READ BACK BY AND VERIFIED WITH: JASON ROBBINS AT 0626 ON 09/24/2016 JJB    Culture (A)  Final    GROUP B STREP(S.AGALACTIAE)ISOLATED SUSCEPTIBILITIES PERFORMED ON PREVIOUS CULTURE WITHIN THE LAST 5 DAYS.    Report Status 09/27/2016 FINAL  Final  Blood culture (routine x 2)     Status: Abnormal   Collection Time: 09/23/16  7:44 PM  Result Value Ref Range Status   Specimen Description BLOOD RIGHT ANTECUBITAL  Final   Special Requests   Final    BOTTLES DRAWN AEROBIC AND ANAEROBIC Blood Culture adequate volume   Culture  Setup Time   Final    GRAM POSITIVE COCCI IN BOTH AEROBIC AND ANAEROBIC BOTTLES CRITICAL RESULT CALLED TO, READ BACK BY AND VERIFIED WITH: JASON  ROBBINS AT 9485 ON 09/24/2016 JJB Performed at [facility], [address] [place], [place] [address]    Culture GROUP B STREP(S.AGALACTIAE)ISOLATED (A)  Final   Report Status 09/27/2016 FINAL  Final   Organism ID, Bacteria GROUP B STREP(S.AGALACTIAE)ISOLATED  Final      Susceptibility   Group b strep(s.agalactiae)isolated - MIC*    CLINDAMYCIN <=0.25 SENSITIVE Sensitive     AMPICILLIN <=0.25 SENSITIVE Sensitive     ERYTHROMYCIN <=0.12 SENSITIVE Sensitive     VANCOMYCIN 0.5 SENSITIVE Sensitive     CEFTRIAXONE <=0.12 SENSITIVE Sensitive     LEVOFLOXACIN 1 SENSITIVE Sensitive     * GROUP B STREP(S.AGALACTIAE)ISOLATED  Blood Culture ID Panel (Reflexed)     Status: Abnormal   Collection Time: 09/23/16  7:44 PM  Result Value Ref Range Status   Enterococcus species NOT DETECTED NOT DETECTED Final   Listeria monocytogenes NOT DETECTED NOT DETECTED Final   Staphylococcus species NOT DETECTED NOT DETECTED Final   Staphylococcus aureus NOT DETECTED NOT DETECTED Final   Streptococcus species DETECTED (A) NOT DETECTED Final    Comment: CRITICAL RESULT CALLED TO, READ BACK BY AND VERIFIED WITH: JASON ROBBINS AT 1608 ON 09/24/2016 JJB    Streptococcus agalactiae DETECTED (A) NOT DETECTED Final    Comment: CRITICAL RESULT CALLED TO, READ BACK BY AND VERIFIED WITH: JASON ROBBINS AT 1608 ON 09/24/2016 JJB    Streptococcus pneumoniae NOT DETECTED NOT DETECTED Final   Streptococcus pyogenes NOT DETECTED NOT DETECTED Final   Acinetobacter baumannii NOT DETECTED NOT DETECTED Final   Enterobacteriaceae species NOT DETECTED NOT DETECTED Final   Enterobacter cloacae complex NOT DETECTED NOT DETECTED Final   Escherichia coli NOT DETECTED NOT DETECTED Final   Klebsiella oxytoca NOT DETECTED NOT DETECTED Final   Klebsiella pneumoniae NOT DETECTED NOT DETECTED Final   Proteus species NOT DETECTED NOT DETECTED Final   Serratia marcescens NOT DETECTED NOT DETECTED Final   Haemophilus influenzae NOT  DETECTED NOT DETECTED Final   Neisseria meningitidis NOT DETECTED NOT DETECTED Final   Pseudomonas aeruginosa NOT DETECTED NOT DETECTED Final   Candida albicans NOT DETECTED NOT DETECTED Final   Candida glabrata NOT DETECTED NOT DETECTED Final   Candida krusei NOT DETECTED NOT DETECTED Final   Candida parapsilosis NOT DETECTED NOT DETECTED Final   Candida tropicalis NOT DETECTED NOT DETECTED Final  Aerobic/Anaerobic Culture (surgical/deep wound)     Status: None   Collection Time: 09/24/16 12:34 AM  Result Value Ref Range  Status   Specimen Description WOUND RIGHT GREAT TOE  Final   Special Requests NONE  Final   Gram Stain   Final    RARE WBC PRESENT, PREDOMINANTLY PMN RARE SQUAMOUS EPITHELIAL CELLS PRESENT ABUNDANT GRAM POSITIVE COCCI IN PAIRS MODERATE GRAM NEGATIVE RODS RARE GRAM POSITIVE RODS    Culture   Final    MODERATE GROUP B STREP(S.AGALACTIAE)ISOLATED TESTING AGAINST S. AGALACTIAE NOT ROUTINELY PERFORMED DUE TO PREDICTABILITY OF AMP/PEN/VAN SUSCEPTIBILITY. FEW STAPHYLOCOCCUS AUREUS FEW PREVOTELLA BIVIA BETA LACTAMASE POSITIVE Performed at [facility], [place] [address], [place], [place] [address]    Report Status 09/28/2016 FINAL  Final   Organism ID, Bacteria STAPHYLOCOCCUS AUREUS  Final      Susceptibility   Staphylococcus aureus - MIC*    CIPROFLOXACIN <=0.5 SENSITIVE Sensitive     ERYTHROMYCIN >=8 RESISTANT Resistant     GENTAMICIN <=0.5 SENSITIVE Sensitive     OXACILLIN <=0.25 SENSITIVE Sensitive     TETRACYCLINE <=1 SENSITIVE Sensitive     VANCOMYCIN <=0.5 SENSITIVE Sensitive     TRIMETH/SULFA <=10 SENSITIVE Sensitive     CLINDAMYCIN RESISTANT Resistant     RIFAMPIN <=0.5 SENSITIVE Sensitive     Inducible Clindamycin POSITIVE Resistant     * FEW STAPHYLOCOCCUS AUREUS  Aerobic/Anaerobic Culture (surgical/deep wound)     Status: None   Collection Time: 09/24/16 12:35 AM  Result Value Ref Range Status   Specimen Description WOUND LEFT GREAT TOE  Final    Special Requests NONE  Final   Gram Stain   Final    FEW WBC PRESENT, PREDOMINANTLY PMN ABUNDANT GRAM NEGATIVE RODS IN PAIRS ABUNDANT GRAM NEGATIVE RODS FEW GRAM POSITIVE RODS    Culture   Final    ABUNDANT GROUP B STREP(S.AGALACTIAE)ISOLATED TESTING AGAINST S. AGALACTIAE NOT ROUTINELY PERFORMED DUE TO PREDICTABILITY OF AMP/PEN/VAN SUSCEPTIBILITY. ABUNDANT PREVOTELLA BIVIA BETA LACTAMASE POSITIVE Performed at [facility], [place] [address], [place], [place] [address]    Report Status 09/28/2016 FINAL  Final  CULTURE, BLOOD (ROUTINE X 2) w Reflex to ID Panel     Status: None   Collection Time: 09/26/16  7:14 PM  Result Value Ref Range Status   Specimen Description BLOOD BLOOD RIGHT HAND  Final   Special Requests   Final    BOTTLES DRAWN AEROBIC AND ANAEROBIC Blood Culture results may not be optimal due to an excessive volume of blood received in culture bottles   Culture NO GROWTH 5 DAYS  Final   Report Status 10/01/2016 FINAL  Final  CULTURE, BLOOD (ROUTINE X 2) w Reflex to ID Panel     Status: None   Collection Time: 09/26/16  7:22 PM  Result Value Ref Range Status   Specimen Description BLOOD [facility]  Final   Special Requests   Final    BOTTLES DRAWN AEROBIC AND ANAEROBIC Blood Culture results may not be optimal due to an excessive volume of blood received in culture bottles   Culture NO GROWTH 5 DAYS  Final   Report Status 10/01/2016 FINAL  Final  MRSA PCR Screening     Status: None   Collection Time: 09/27/16  5:07 PM  Result Value Ref Range Status   MRSA by PCR NEGATIVE NEGATIVE Final    Comment:        The GeneXpert MRSA Assay (FDA approved for NASAL specimens only), is one component of a comprehensive MRSA colonization surveillance program. It is not intended to diagnose MRSA infection nor to guide or monitor treatment  for MRSA infections.   Aerobic/Anaerobic Culture (surgical/deep wound)     Status: None   Collection Time: 09/28/16  4:22 PM  Result Value Ref  Range Status   Specimen Description ABSCESS  Final   Special Requests NONE  Final   Gram Stain   Final    MODERATE WBC PRESENT, PREDOMINANTLY PMN RARE SQUAMOUS EPITHELIAL CELLS PRESENT NO ORGANISMS SEEN    Culture   Final    RARE STREPTOCOCCUS ANGINOSIS RARE ENTEROCOCCUS FAECALIS NO ANAEROBES ISOLATED Performed at [facility], [place] [address] [place], [place] [address]    Report Status 10/04/2016 FINAL  Final   Organism ID, Bacteria ENTEROCOCCUS FAECALIS  Final      Susceptibility   Enterococcus faecalis - MIC*    AMPICILLIN <=2 SENSITIVE Sensitive     VANCOMYCIN 1 SENSITIVE Sensitive     GENTAMICIN SYNERGY RESISTANT Resistant     * RARE ENTEROCOCCUS FAECALIS        Management plans discussed with the patient, family and they are in agreement.  CODE STATUS:     Code Status Orders        Start     Ordered   09/24/16 0223  Full code  Continuous     09/24/16 0223    Code Status History    Date Active Date Inactive Code Status Order ID Comments User Context   This patient has a current code status but no historical code status.      TOTAL TIME TAKING CARE OF THIS PATIENT: 35 minutes.    Loletha Grayer M.D on 10/10/2016 at 3:25 PM  Between 7am to 6pm - Pager - [phone]  After 6pm go to [email or web address] - password [email or web address] [facility]  [employer] [facility]  [phone]  CC: Primary care physician; Patient, No Pcp Per

## 2016-10-10 NOTE — Progress Notes (Signed)
Physical Therapy Treatment Patient Details Name: Kyle Banks MRN: [id number] DOB: 08/24/1955 Today's Date: 10/10/2016    History of Present Illness Kyle Banks is a 61 y.o. male with no known medical history presents to the emergency department for evaluation of foot wounds. Patient was in a usual state of health until 3 days ago when he describes the sudden onset of redness, pain, swelling and discharge from his feet bilaterally. He states prior to this infection his feet appeared normal. Patient is unaware of any medical problems such as diabetes and states that he has had annual physicals and blood work at various urgent care centers. Patient denies fevers/chills, weakness, dizziness, chest pain, shortness of breath, N/V/C/D, abdominal pain, dysuria/frequency, changes in mental status. Otherwise, there has been no change in status. Patient has been taking medication as prescribed and there has been no recent change in medication or diet.  No recent antibiotics.  There has been no recent illness, hospitalizations, travel or sick contacts. 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 Attempted to peform PT evaluation on 09/24/16 however pt with myoclonic jerks, dizziness, and facial droop when attempting ambulation. Head CT negative for acute infarct. Pt is now s/p I&D on 6/15. Received continue upon transfer order. Pt is now NWB on L LE. Pt is now s/p a L 1st ray resection (performed on 6/20). Pt is still NWB on L LE. New orders received for PT. Pt now s/p I&D performed on 10/05/16 on his R foot. Received continue upon transfer order. Pt now WB through R heel only and still NWB on L.    PT Comments    Pt progressing well. Performed sit to/from stand with min guard and RW this session. Ambulated from EOB to recliner (48ft) with RW and min guard assist (occasional min assist when pt pivoting on R heel). Pt better able to  maintain BLE weight bearing precautions this session - min verbal cueing required. Pt educated on pressure relieving techniques given complaints that his bottom feels raw. RN notified. Will continue to progress.    Follow Up Recommendations  SNF     Equipment Recommendations  Rolling walker with 5" wheels    Recommendations for Other Services       Precautions / Restrictions Precautions Precautions: Fall Required Braces or Orthoses:  (B post op shoes) Restrictions Weight Bearing Restrictions: Yes RLE Weight Bearing: Partial weight bearing RLE Partial Weight Bearing Percentage or Pounds: heel LLE Weight Bearing: Non weight bearing    Mobility  Bed Mobility Overal bed mobility: Needs Assistance Bed Mobility: Supine to Sit     Supine to sit: Modified independent (Device/Increase time)     General bed mobility comments: Good technique and upright posture noted once EOB. Denies dizziness/lightheadedness. ModI due to increased time with transfer.  Transfers Overall transfer level: Needs assistance Equipment used: Rolling walker (2 wheeled) Transfers: Sit to/from Stand Sit to Stand: Min guard         General transfer comment: Min guard for safety. Pt steady balancing on R heel with BUE support on RW. Pt takes more time to complete sit to stand transfer without physical assistance, but he is able to maintain safe technique.   Ambulation/Gait Ambulation/Gait assistance: Min guard Ambulation Distance (Feet): 2 Feet Assistive device: Rolling walker (2 wheeled) Gait Pattern/deviations: Trunk flexed     General Gait Details: Ambulated from EOB to recliner (72ft) with RW and min guard. Occasional min assist  provided when pt pivoting on R heel for safety. Pt able to maintain BLE weight bearing precautions with min verbal cueing.     Stairs            Wheelchair Mobility    Modified Rankin (Stroke Patients Only)       Balance Overall balance assessment: No apparent  balance deficits (not formally assessed)                                          Cognition Arousal/Alertness: Awake/alert Behavior During Therapy: WFL for tasks assessed/performed Overall Cognitive Status: Within Functional Limits for tasks assessed                                        Exercises Other Exercises Other Exercises: Supine ther-ex x15 B included: ankle pumps, SLR's, heel slides (keeping heel off bed), and hip abd. Recliner ther-ex in recliner x15 B included crunches (recliner set back to 45deg) and chair press ups (pressure relief). Performed with supervision. Pt educated to perform chair press ups (x15) or pressure relief for 85min per side. Pt complained his bottom still felt raw this session. RN notified.    General Comments        Pertinent Vitals/Pain Pain Assessment: 0-10 Pain Score: 0-No pain Pain Location: both feet Pain Descriptors / Indicators: Operative site guarding;Aching Pain Intervention(s): Limited activity within patient's tolerance;Monitored during session;Premedicated before session;Repositioned    Home Living                      Prior Function            PT Goals (current goals can now be found in the care plan section) Acute Rehab PT Goals Patient Stated Goal: Return to prior function at home PT Goal Formulation: With patient Time For Goal Achievement: 10/22/16 Potential to Achieve Goals: Good Progress towards PT goals: Progressing toward goals    Frequency    7X/week      PT Plan Current plan remains appropriate    Co-evaluation              AM-PAC PT "6 Clicks" Daily Activity  Outcome Measure  Difficulty turning over in bed (including adjusting bedclothes, sheets and blankets)?: None Difficulty moving from lying on back to sitting on the side of the bed? : None Difficulty sitting down on and standing up from a chair with arms (e.g., wheelchair, bedside commode, etc,.)?:  Total Help needed moving to and from a bed to chair (including a wheelchair)?: A Little Help needed walking in hospital room?: A Lot Help needed climbing 3-5 steps with a railing? : Total 6 Click Score: 15    End of Session Equipment Utilized During Treatment: Gait belt Activity Tolerance: Patient tolerated treatment well Patient left: in chair;with call bell/phone within reach;with family/visitor present Nurse Communication: Mobility status;Other (comment) (Pt complaint about his bottom feeling raw) PT Visit Diagnosis: Unsteadiness on feet (R26.81);Muscle weakness (generalized) (M62.81)     Time: 9371-6967 PT Time Calculation (min) (ACUTE ONLY): 23 min  Charges:                       G Codes:       Kyle Banks, PT, SPT  Kyle Banks 10/10/2016, 9:42  AM

## 2016-10-10 NOTE — Progress Notes (Signed)
EMS here for pick up.   Ransom Nickson, RN  

## 2016-10-10 NOTE — Progress Notes (Signed)
Patient alert and oriented. UP in recliner. No pain at this time. Receiving IV ABX in PICC. Bilateral feet dressings clean, dry, and intact. RN called report to Therapist, sports at [employer]. EMS called for transport.  Deri Fuelling, RN

## 2016-10-10 NOTE — NC FL2 (Signed)
[place] LEVEL OF CARE SCREENING TOOL     IDENTIFICATION  Patient Name: Kyle Banks Birthdate: 23-Dec-1955 Sex: male Admission Date (Current Location): 09/23/2016  [place] and [place] Number:  Engineering geologist and Address:  [facility], [address], [place], [place] [address]      Provider Number: [id number]  Attending Physician Name and Address:  Loletha Grayer, MD  Relative Name and Phone Number:       Current Level of Care: Hospital Recommended Level of Care: [place] Prior Approval Number:    Date Approved/Denied:   PASRR Number:  [id number])  Discharge Plan: SNF    Current Diagnoses: Patient Active Problem List   Diagnosis Date Noted  . Sepsis due to cellulitis ([place]) 09/23/2016    Orientation RESPIRATION BLADDER Height & Weight     Self, Situation, Place  Room Air  Incontinent Weight: 241 lb (109.3 kg) Height:  5' 11"  (180.3 cm)  BEHAVIORAL SYMPTOMS/MOOD NEUROLOGICAL BOWEL NUTRITION STATUS   (none )  (none) Continent Diet: Carb Modified   AMBULATORY STATUS COMMUNICATION OF NEEDS Skin   Extensive Assist Verbally Surgical wounds (Incision: Left Foot )                       Personal Care Assistance Level of Assistance  Bathing, Feeding, Dressing Bathing Assistance: Limited assistance Feeding assistance: Independent Dressing Assistance: Limited assistance     Functional Limitations Info  Sight, Hearing, Speech Sight Info: Adequate Hearing Info: Adequate Speech Info: Adequate    SPECIAL CARE FACTORS FREQUENCY  PT (By licensed PT), OT (By licensed OT)   PICC line IV Abx      PT Frequency:  (5) OT Frequency:  (5)            Contractures      Additional Factors Info  Code Status, Allergies, Insulin Sliding Scale Code Status Info:  (Full Code. ) Allergies Info:  (No Known Allergies. )   Insulin Sliding Scale Info:  (NovoLog Insulin Injections. )       Current  Medications (10/10/2016):  This is the current hospital active medication list Current Facility-Administered Medications  Medication Dose Route Frequency Provider Last Rate Last Dose  . acetaminophen (TYLENOL) tablet 650 mg  650 mg Oral Q6H PRN Hugelmeyer, Alexis, DO   650 mg at 09/26/16 1735   Or  . acetaminophen (TYLENOL) suppository 650 mg  650 mg Rectal Q6H PRN Hugelmeyer, Alexis, DO   650 mg at 09/24/16 1237  . albuterol (PROVENTIL) (2.5 MG/3ML) 0.083% nebulizer solution 2.5 mg  2.5 mg Nebulization Q6H PRN Hugelmeyer, Alexis, DO      . alum & mag hydroxide-simeth (MAALOX/MYLANTA) 200-200-20 MG/5ML suspension 15 mL  15 mL Oral Q4H PRN Hillary Bow, MD   15 mL at 10/02/16 0503  . aspirin EC tablet 81 mg  81 mg Oral Daily Epifanio Lesches, MD   81 mg at 10/10/16 6160  . bisacodyl (DULCOLAX) EC tablet 5 mg  5 mg Oral Daily PRN Hugelmeyer, Alexis, DO   5 mg at 09/29/16 2127  . chlorhexidine (HIBICLENS) 4 % liquid 4 application  60 mL Topical Once Samara Deist, DPM      . enoxaparin (LOVENOX) injection 40 mg  40 mg Subcutaneous Q24H Hugelmeyer, Alexis, DO   40 mg at 10/09/16 2140  . fenofibrate tablet 160 mg  160 mg Oral Daily Epifanio Lesches, MD   160 mg at 10/10/16 1253  . ferrous sulfate tablet  325 mg  325 mg Oral BID WC Loletha Grayer, MD   325 mg at 10/10/16 1610  . insulin aspart (novoLOG) injection 0-9 Units  0-9 Units Subcutaneous TID WC Fritzi Mandes, MD   1 Units at 10/10/16 1252  . insulin aspart (novoLOG) injection 5 Units  5 Units Subcutaneous TID WC Epifanio Lesches, MD   5 Units at 10/10/16 1253  . insulin glargine (LANTUS) injection 26 Units  26 Units Subcutaneous Q24H Hillary Bow, MD   26 Units at 10/10/16 1009  . insulin starter kit- pen needles (English) 1 kit  1 kit Other Once Epifanio Lesches, MD      . ipratropium (ATROVENT) nebulizer solution 0.5 mg  0.5 mg Nebulization Q6H PRN Hugelmeyer, Alexis, DO      . magnesium citrate solution 1 Bottle  1 Bottle  Oral Once PRN Hugelmeyer, Alexis, DO      . morphine 4 MG/ML injection 4 mg  4 mg Intravenous Q2H PRN Sharlotte Alamo, DPM   4 mg at 10/06/16 0539  . multivitamin with minerals tablet 1 tablet  1 tablet Oral Daily Loletha Grayer, MD   1 tablet at 10/10/16 9604  . nutrition supplement (JUVEN) (JUVEN) powder packet 1 packet  1 packet Oral BID BM Fritzi Mandes, MD   1 packet at 10/10/16 1009  . ondansetron (ZOFRAN) tablet 4 mg  4 mg Oral Q6H PRN Hugelmeyer, Alexis, DO   4 mg at 09/27/16 1724   Or  . ondansetron (ZOFRAN) injection 4 mg  4 mg Intravenous Q6H PRN Hugelmeyer, Alexis, DO   4 mg at 10/06/16 1354  . oxyCODONE-acetaminophen (PERCOCET/ROXICET) 5-325 MG per tablet 1-2 tablet  1-2 tablet Oral Q4H PRN Sharlotte Alamo, DPM   2 tablet at 10/10/16 5409  . piperacillin-tazobactam (ZOSYN) IVPB 3.375 g  3.375 g Intravenous Q8H Leonel Ramsay, MD   Stopped at 10/10/16 [phone]  . protein supplement (PREMIER PROTEIN) liquid  11 oz Oral Q24H Fritzi Mandes, MD   11 oz at 10/09/16 1940  . senna-docusate (Senokot-S) tablet 1 tablet  1 tablet Oral QHS PRN Hugelmeyer, Alexis, DO      . senna-docusate (Senokot-S) tablet 2 tablet  2 tablet Oral BID Hillary Bow, MD   2 tablet at 10/10/16 1000  . sodium chloride (OCEAN) 0.65 % nasal spray 1 spray  1 spray Each Nare Q6H PRN Gouru, Aruna, MD      . sodium chloride flush (NS) 0.9 % injection 10-40 mL  10-40 mL Intracatheter Q12H Gouru, Aruna, MD   10 mL at 10/08/16 2116  . sodium chloride flush (NS) 0.9 % injection 10-40 mL  10-40 mL Intracatheter PRN Gouru, Aruna, MD      . sodium chloride flush (NS) 0.9 % injection 3 mL  3 mL Intravenous Q12H Hugelmeyer, Alexis, DO   3 mL at 10/08/16 2116  . zolpidem (AMBIEN) tablet 5 mg  5 mg Oral QHS PRN,MR X 1 Hugelmeyer, Alexis, DO   5 mg at 10/09/16 2144     Discharge Medications: Please see discharge summary for a list of discharge medications.  Relevant Imaging Results:  Relevant Lab Results:   Additional Information   (SSN: [id number])  Clara Herbison, Veronia Beets, LCSW

## 2016-10-10 NOTE — Progress Notes (Signed)
Clinical Education officer, museum (CSW) received a call from [place] [facility] stating that they no longer have a bed for patient. CSW made patient aware and he chose bed at [facility]. Per [facility] admissions coordinator at [facility] she will accept patient today with a 5 day LOG. [place] approved 5 day LOG.   Patient is medically stable for D/C to [facility] today. Per [facility] admissions coordinator at [facility] patient can come today to room 507. RN will call report and arrange EMS for transport. CSW sent D/C orders to [facility] via [place]. Patient is aware of above. Patient is also aware that if [facility] denies SNF authorization then he will have to D/C from [facility] after 5 days. CSW contacted patient's son Lonn [place] and made him aware of above. CSW also made patient's daughter Omega and made her aware of above. Please reconsult if future social work needs arise. CSW signing off.   [employer], LCSW [phone]

## 2016-10-10 NOTE — Progress Notes (Signed)
Chaplain made a follow up visit pt. Pt was in good spirit this pm and mentioned he will be moving to Rehab place this evening. Pt talked about his family, his grandchildren, and talked about his future plans and what he desires to do after he get better. Pt exhibited a positive attitude, he was optimistic about recovering soon, and requested for prayers to heal, which [place] provided with a ministry of presence.     10/10/16 1500  Clinical Encounter Type  Visited With Patient  Visit Type Follow-up;Spiritual support  Referral From [place]

## 2016-10-10 NOTE — Clinical Social Work Placement (Signed)
   CLINICAL SOCIAL WORK PLACEMENT  NOTE  Date:  10/10/2016  Patient Details  Name: Kyle Banks MRN: [id number] Date of Birth: July 05, 1955  Clinical Social Work is seeking post-discharge placement for this patient at the [place] level of care (*CSW will initial, date and re-position this form in  chart as items are completed):  Yes   Patient/family provided with [place] Work Department's list of facilities offering this level of care within the geographic area requested by the patient (or if unable, by the patient's family).  Yes   Patient/family informed of their freedom to choose among providers that offer the needed level of care, that participate in Medicare, Medicaid or managed care program needed by the patient, have an available bed and are willing to accept the patient.  Yes   Patient/family informed of [facility]'s ownership interest in [facility] and [facility], as well as of the fact that they are under no obligation to receive care at these facilities.  PASRR submitted to EDS on 09/26/16     PASRR number received on 09/26/16     Existing PASRR number confirmed on       FL2 transmitted to all facilities in geographic area requested by pt/family on 09/26/16     FL2 transmitted to all facilities within larger geographic area on       Patient informed that his/her managed care company has contracts with or will negotiate with certain facilities, including the following:        Yes   Patient/family informed of bed offers received.  Patient chooses bed at  [facility] )     Physician recommends and patient chooses bed at      Patient to be transferred to  [facility] ) on 10/10/16.  Patient to be transferred to facility by  [facility] )     Patient family notified on 10/10/16 of transfer.  Name of family member notified:   (Patient's son [name] is aware of D/C today. )     PHYSICIAN        Additional Comment:    _______________________________________________ Nyeem Stoke, Veronia Beets, LCSW 10/10/2016, 4:20 PM

## 2016-10-31 ENCOUNTER — Inpatient Hospital Stay
Admission: AD | Admit: 2016-10-31 | Discharge: 2016-11-05 | DRG: 501 | Disposition: A | Discharge status: Skilled Nursing Facility | Payer: 59 | Source: Ambulatory Visit | Attending: Internal Medicine | Admitting: Internal Medicine

## 2016-10-31 ENCOUNTER — Inpatient Hospital Stay: Payer: 59 | Admitting: Registered Nurse

## 2016-10-31 ENCOUNTER — Encounter: Payer: Self-pay | Admitting: Cardiology

## 2016-10-31 ENCOUNTER — Ambulatory Visit: Admit: 2016-10-31 | Payer: 59 | Admitting: Podiatry

## 2016-10-31 ENCOUNTER — Encounter
Admission: AD | Disposition: A | Discharge status: Skilled Nursing Facility | Payer: Self-pay | Source: Ambulatory Visit | Attending: Specialist

## 2016-10-31 DIAGNOSIS — E11621 Type 2 diabetes mellitus with foot ulcer: Secondary | ICD-10-CM | POA: Diagnosis present

## 2016-10-31 DIAGNOSIS — Z7901 Long term (current) use of anticoagulants: Secondary | ICD-10-CM | POA: Diagnosis not present

## 2016-10-31 DIAGNOSIS — L97529 Non-pressure chronic ulcer of other part of left foot with unspecified severity: Secondary | ICD-10-CM | POA: Diagnosis present

## 2016-10-31 DIAGNOSIS — T8130XA Disruption of wound, unspecified, initial encounter: Secondary | ICD-10-CM | POA: Diagnosis present

## 2016-10-31 DIAGNOSIS — Y848 Other medical procedures as the cause of abnormal reaction of the patient, or of later complication, without mention of misadventure at the time of the procedure: Secondary | ICD-10-CM | POA: Diagnosis present

## 2016-10-31 DIAGNOSIS — E1169 Type 2 diabetes mellitus with other specified complication: Secondary | ICD-10-CM | POA: Diagnosis present

## 2016-10-31 DIAGNOSIS — Z6834 Body mass index (BMI) 34.0-34.9, adult: Secondary | ICD-10-CM | POA: Diagnosis not present

## 2016-10-31 DIAGNOSIS — Z7982 Long term (current) use of aspirin: Secondary | ICD-10-CM

## 2016-10-31 DIAGNOSIS — E785 Hyperlipidemia, unspecified: Secondary | ICD-10-CM | POA: Diagnosis present

## 2016-10-31 DIAGNOSIS — Z89421 Acquired absence of other right toe(s): Secondary | ICD-10-CM | POA: Diagnosis not present

## 2016-10-31 DIAGNOSIS — M19071 Primary osteoarthritis, right ankle and foot: Secondary | ICD-10-CM | POA: Diagnosis present

## 2016-10-31 DIAGNOSIS — Z96653 Presence of artificial knee joint, bilateral: Secondary | ICD-10-CM | POA: Diagnosis present

## 2016-10-31 DIAGNOSIS — L02612 Cutaneous abscess of left foot: Secondary | ICD-10-CM | POA: Diagnosis present

## 2016-10-31 DIAGNOSIS — Z794 Long term (current) use of insulin: Secondary | ICD-10-CM | POA: Diagnosis not present

## 2016-10-31 DIAGNOSIS — E1151 Type 2 diabetes mellitus with diabetic peripheral angiopathy without gangrene: Secondary | ICD-10-CM | POA: Diagnosis present

## 2016-10-31 DIAGNOSIS — Z89422 Acquired absence of other left toe(s): Secondary | ICD-10-CM

## 2016-10-31 DIAGNOSIS — L97519 Non-pressure chronic ulcer of other part of right foot with unspecified severity: Secondary | ICD-10-CM | POA: Diagnosis present

## 2016-10-31 DIAGNOSIS — I739 Peripheral vascular disease, unspecified: Secondary | ICD-10-CM | POA: Diagnosis present

## 2016-10-31 DIAGNOSIS — D638 Anemia in other chronic diseases classified elsewhere: Secondary | ICD-10-CM | POA: Diagnosis present

## 2016-10-31 DIAGNOSIS — Z79899 Other long term (current) drug therapy: Secondary | ICD-10-CM | POA: Diagnosis not present

## 2016-10-31 DIAGNOSIS — E669 Obesity, unspecified: Secondary | ICD-10-CM | POA: Diagnosis present

## 2016-10-31 DIAGNOSIS — M869 Osteomyelitis, unspecified: Principal | ICD-10-CM | POA: Diagnosis present

## 2016-10-31 DIAGNOSIS — L089 Local infection of the skin and subcutaneous tissue, unspecified: Secondary | ICD-10-CM | POA: Diagnosis present

## 2016-10-31 HISTORY — DX: Anemia, unspecified: D64.9

## 2016-10-31 HISTORY — PX: IRRIGATION AND DEBRIDEMENT FOOT: SHX6602

## 2016-10-31 HISTORY — DX: Osteomyelitis, unspecified: M86.9

## 2016-10-31 HISTORY — DX: Type 2 diabetes mellitus without complications: E11.9

## 2016-10-31 LAB — COMPREHENSIVE METABOLIC PANEL
ALBUMIN: 3 g/dL — AB (ref 3.5–5.0)
ALK PHOS: 41 U/L (ref 38–126)
ALT: 11 U/L — AB (ref 17–63)
ANION GAP: 7 (ref 5–15)
AST: 14 U/L — ABNORMAL LOW (ref 15–41)
BUN: 18 mg/dL (ref 6–20)
CALCIUM: 9.2 mg/dL (ref 8.9–10.3)
CHLORIDE: 106 mmol/L (ref 101–111)
CO2: 24 mmol/L (ref 22–32)
CREATININE: 0.99 mg/dL (ref 0.61–1.24)
GFR calc Af Amer: >60 mL/min (ref >60)
GFR calc non Af Amer: >60 mL/min (ref >60)
GLUCOSE: 141 mg/dL — AB (ref 65–99)
Potassium: 3.7 mmol/L (ref 3.5–5.1)
SODIUM: 137 mmol/L (ref 135–145)
Total Bilirubin: 0.5 mg/dL (ref 0.3–1.2)
Total Protein: 9.1 g/dL — ABNORMAL HIGH (ref 6.5–8.1)

## 2016-10-31 LAB — CBC WITH DIFFERENTIAL/PLATELET
Basophils Absolute: 0.1 10*3/uL (ref 0–0.1)
Basophils Relative: 1 %
EOS ABS: 0.1 10*3/uL (ref 0–0.7)
Eosinophils Relative: 2 %
HCT: 30.9 % — ABNORMAL LOW (ref 40.0–52.0)
HEMOGLOBIN: 10.1 g/dL — AB (ref 13.0–18.0)
LYMPHS ABS: 2.3 10*3/uL (ref 1.0–3.6)
LYMPHS PCT: 43 %
MCH: 26.9 pg (ref 26.0–34.0)
MCHC: 32.8 g/dL (ref 32.0–36.0)
MCV: 82.1 fL (ref 80.0–100.0)
MONOS PCT: 9 %
Monocytes Absolute: 0.5 10*3/uL (ref 0.2–1.0)
NEUTROS PCT: 45 %
Neutro Abs: 2.5 10*3/uL (ref 1.4–6.5)
Platelets: 279 10*3/uL (ref 150–440)
RBC: 3.77 MIL/uL — AB (ref 4.40–5.90)
RDW: 15.5 % — ABNORMAL HIGH (ref 11.5–14.5)
WBC: 5.5 10*3/uL (ref 3.8–10.6)

## 2016-10-31 LAB — GLUCOSE, CAPILLARY
GLUCOSE-CAPILLARY: 288 mg/dL — AB (ref 65–99)
Glucose-Capillary: 132 mg/dL — ABNORMAL HIGH (ref 65–99)
Glucose-Capillary: 114 mg/dL — ABNORMAL HIGH (ref 65–99)
Glucose-Capillary: 116 mg/dL — ABNORMAL HIGH (ref 65–99)

## 2016-10-31 LAB — URINALYSIS, ROUTINE W REFLEX MICROSCOPIC
BILIRUBIN URINE: NEGATIVE
GLUCOSE, UA: NEGATIVE mg/dL
HGB URINE DIPSTICK: NEGATIVE
Ketones, ur: NEGATIVE mg/dL
Leukocytes, UA: NEGATIVE
Nitrite: NEGATIVE
PH: 6 (ref 5.0–8.0)
Protein, ur: NEGATIVE mg/dL
SPECIFIC GRAVITY, URINE: 1.02 (ref 1.005–1.030)

## 2016-10-31 LAB — MAGNESIUM: Magnesium: 1.7 mg/dL (ref 1.7–2.4)

## 2016-10-31 SURGERY — IRRIGATION AND DEBRIDEMENT FOOT
Anesthesia: General | Site: Foot | Laterality: Bilateral | Wound class: Dirty or Infected

## 2016-10-31 MED ORDER — PIPERACILLIN-TAZOBACTAM 3.375 G IVPB
3.3750 g | Freq: Three times a day (TID) | INTRAVENOUS | Status: DC
  Administered 2016-10-31 – 2016-11-05 (×14): 3.375 g via INTRAVENOUS
  Filled 2016-10-31 (×15): qty 50

## 2016-10-31 MED ORDER — MORPHINE SULFATE (PF) 2 MG/ML IV SOLN: 2.0000 mg | INTRAVENOUS | Status: DC | PRN

## 2016-10-31 MED ORDER — ONDANSETRON HCL 4 MG PO TABS: 4.0000 mg | ORAL_TABLET | Freq: Four times a day (QID) | ORAL | Status: DC | PRN

## 2016-10-31 MED ORDER — FENTANYL CITRATE (PF) 100 MCG/2ML IJ SOLN
INTRAMUSCULAR | Status: AC
  Filled 2016-10-31: qty 2

## 2016-10-31 MED ORDER — INSULIN GLARGINE 100 UNIT/ML ~~LOC~~ SOLN
26.0000 [IU] | Freq: Every day | SUBCUTANEOUS | Status: DC
  Administered 2016-10-31 – 2016-11-04 (×4): 26 [IU] via SUBCUTANEOUS
  Filled 2016-10-31 (×7): qty 0.26

## 2016-10-31 MED ORDER — INSULIN ASPART 100 UNIT/ML ~~LOC~~ SOLN
5.0000 [IU] | Freq: Three times a day (TID) | SUBCUTANEOUS | Status: DC
  Administered 2016-11-01 – 2016-11-04 (×10): 5 [IU] via SUBCUTANEOUS
  Filled 2016-10-31 (×10): qty 1

## 2016-10-31 MED ORDER — PHENYLEPHRINE HCL 10 MG/ML IJ SOLN
INTRAMUSCULAR | Status: DC | PRN
  Administered 2016-10-31 (×3): 100 ug via INTRAVENOUS

## 2016-10-31 MED ORDER — ALBUTEROL SULFATE (2.5 MG/3ML) 0.083% IN NEBU: 2.5000 mg | INHALATION_SOLUTION | RESPIRATORY_TRACT | Status: DC | PRN

## 2016-10-31 MED ORDER — HYDROCODONE-ACETAMINOPHEN 5-325 MG PO TABS
1.0000 | ORAL_TABLET | ORAL | Status: DC | PRN
  Administered 2016-10-31: 2 via ORAL
  Administered 2016-11-01: 1 via ORAL
  Administered 2016-11-01 – 2016-11-05 (×13): 2 via ORAL
  Filled 2016-10-31: qty 2
  Filled 2016-10-31: qty 1
  Filled 2016-10-31 (×13): qty 2

## 2016-10-31 MED ORDER — MEPERIDINE HCL 50 MG/ML IJ SOLN: 6.2500 mg | INTRAMUSCULAR | Status: DC | PRN

## 2016-10-31 MED ORDER — BISACODYL 5 MG PO TBEC: 5.0000 mg | DELAYED_RELEASE_TABLET | Freq: Every day | ORAL | Status: DC | PRN

## 2016-10-31 MED ORDER — MIDAZOLAM HCL 2 MG/2ML IJ SOLN
INTRAMUSCULAR | Status: AC
  Filled 2016-10-31: qty 2

## 2016-10-31 MED ORDER — FERROUS SULFATE 325 (65 FE) MG PO TABS
325.0000 mg | ORAL_TABLET | Freq: Two times a day (BID) | ORAL | Status: DC
  Administered 2016-11-01 – 2016-11-05 (×9): 325 mg via ORAL
  Filled 2016-10-31 (×8): qty 1

## 2016-10-31 MED ORDER — DEXAMETHASONE SODIUM PHOSPHATE 10 MG/ML IJ SOLN
INTRAMUSCULAR | Status: DC | PRN
  Administered 2016-10-31: 10 mg via INTRAVENOUS

## 2016-10-31 MED ORDER — PROPOFOL 10 MG/ML IV BOLUS
INTRAVENOUS | Status: DC | PRN
  Administered 2016-10-31: 150 mg via INTRAVENOUS

## 2016-10-31 MED ORDER — OXYCODONE HCL 5 MG/5ML PO SOLN: 5.0000 mg | Freq: Once | ORAL | Status: DC | PRN

## 2016-10-31 MED ORDER — FENTANYL CITRATE (PF) 100 MCG/2ML IJ SOLN: 25.0000 ug | INTRAMUSCULAR | Status: DC | PRN

## 2016-10-31 MED ORDER — SODIUM CHLORIDE 0.9 % IV SOLN
INTRAVENOUS | Status: DC | PRN
  Administered 2016-10-31: 25 ug/min via INTRAVENOUS

## 2016-10-31 MED ORDER — MORPHINE SULFATE (PF) 4 MG/ML IV SOLN: 4.0000 mg | INTRAVENOUS | Status: DC | PRN

## 2016-10-31 MED ORDER — SENNOSIDES-DOCUSATE SODIUM 8.6-50 MG PO TABS
2.0000 | ORAL_TABLET | Freq: Two times a day (BID) | ORAL | Status: DC
  Administered 2016-11-01 – 2016-11-05 (×9): 2 via ORAL
  Filled 2016-10-31 (×9): qty 2

## 2016-10-31 MED ORDER — VITAMIN B-12 1000 MCG PO TABS
500.0000 ug | ORAL_TABLET | Freq: Every day | ORAL | Status: DC
  Administered 2016-11-01 – 2016-11-05 (×5): 500 ug via ORAL
  Filled 2016-10-31 (×5): qty 1

## 2016-10-31 MED ORDER — ONDANSETRON HCL 4 MG/2ML IJ SOLN: 4.0000 mg | Freq: Four times a day (QID) | INTRAMUSCULAR | Status: DC | PRN

## 2016-10-31 MED ORDER — ONDANSETRON HCL 4 MG/2ML IJ SOLN
INTRAMUSCULAR | Status: AC
  Filled 2016-10-31: qty 2

## 2016-10-31 MED ORDER — ZOLPIDEM TARTRATE 5 MG PO TABS
5.0000 mg | ORAL_TABLET | Freq: Every evening | ORAL | Status: DC | PRN
  Administered 2016-10-31 – 2016-11-04 (×5): 5 mg via ORAL
  Filled 2016-10-31 (×5): qty 1

## 2016-10-31 MED ORDER — OXYCODONE HCL 5 MG PO TABS: 5.0000 mg | ORAL_TABLET | Freq: Once | ORAL | Status: DC | PRN

## 2016-10-31 MED ORDER — ACETAMINOPHEN 650 MG RE SUPP
650.0000 mg | Freq: Four times a day (QID) | RECTAL | Status: DC | PRN
Start: 2016-10-31 — End: 2016-11-05

## 2016-10-31 MED ORDER — PROPOFOL 10 MG/ML IV BOLUS
INTRAVENOUS | Status: AC
  Filled 2016-10-31: qty 20

## 2016-10-31 MED ORDER — SODIUM CHLORIDE 0.9 % IV SOLN
INTRAVENOUS | Status: DC
  Administered 2016-10-31: 14:00:00 via INTRAVENOUS

## 2016-10-31 MED ORDER — FENOFIBRATE 160 MG PO TABS
160.0000 mg | ORAL_TABLET | Freq: Every day | ORAL | Status: DC
  Administered 2016-11-01 – 2016-11-05 (×5): 160 mg via ORAL
  Filled 2016-10-31 (×6): qty 1

## 2016-10-31 MED ORDER — ACETAMINOPHEN 325 MG PO TABS
650.0000 mg | ORAL_TABLET | Freq: Four times a day (QID) | ORAL | Status: DC | PRN
  Administered 2016-11-02: 650 mg via ORAL
  Filled 2016-10-31 (×2): qty 2

## 2016-10-31 MED ORDER — INSULIN ASPART 100 UNIT/ML ~~LOC~~ SOLN
0.0000 [IU] | Freq: Every day | SUBCUTANEOUS | Status: DC
  Administered 2016-10-31: 3 [IU] via SUBCUTANEOUS
  Filled 2016-10-31: qty 1

## 2016-10-31 MED ORDER — ONDANSETRON HCL 4 MG/2ML IJ SOLN
INTRAMUSCULAR | Status: DC | PRN
  Administered 2016-10-31: 4 mg via INTRAVENOUS

## 2016-10-31 MED ORDER — KETOROLAC TROMETHAMINE 30 MG/ML IJ SOLN: 30.0000 mg | Freq: Four times a day (QID) | INTRAMUSCULAR | Status: AC | PRN

## 2016-10-31 MED ORDER — MIDAZOLAM HCL 5 MG/5ML IJ SOLN
INTRAMUSCULAR | Status: DC | PRN
  Administered 2016-10-31: 2 mg via INTRAVENOUS

## 2016-10-31 MED ORDER — SENNOSIDES-DOCUSATE SODIUM 8.6-50 MG PO TABS: 1.0000 | ORAL_TABLET | Freq: Every evening | ORAL | Status: DC | PRN

## 2016-10-31 MED ORDER — INSULIN ASPART 100 UNIT/ML ~~LOC~~ SOLN
0.0000 [IU] | Freq: Three times a day (TID) | SUBCUTANEOUS | Status: DC
  Administered 2016-11-01 – 2016-11-03 (×3): 2 [IU] via SUBCUTANEOUS
  Administered 2016-11-04 – 2016-11-05 (×3): 1 [IU] via SUBCUTANEOUS
  Filled 2016-10-31 (×6): qty 1

## 2016-10-31 MED ORDER — LACTATED RINGERS IV SOLN
INTRAVENOUS | Status: DC | PRN
  Administered 2016-10-31: 18:00:00 via INTRAVENOUS

## 2016-10-31 MED ORDER — FENTANYL CITRATE (PF) 100 MCG/2ML IJ SOLN
INTRAMUSCULAR | Status: DC | PRN
  Administered 2016-10-31: 50 ug via INTRAVENOUS

## 2016-10-31 MED ORDER — PROMETHAZINE HCL 25 MG/ML IJ SOLN: 6.2500 mg | INTRAMUSCULAR | Status: DC | PRN

## 2016-10-31 SURGICAL SUPPLY — 48 items
BANDAGE ELASTIC 4 LF NS (GAUZE/BANDAGES/DRESSINGS) ×3 IMPLANT
BLADE OSCILLATING/SAGITTAL (BLADE) ×2
BLADE SURG 15 STRL LF DISP TIS (BLADE) ×1 IMPLANT
BLADE SURG 15 STRL SS (BLADE) ×2
BLADE SW THK.38XMED LNG THN (BLADE) ×1 IMPLANT
BNDG ESMARK 4X12 TAN STRL LF (GAUZE/BANDAGES/DRESSINGS) IMPLANT
BNDG GAUZE 4.5X4.1 6PLY STRL (MISCELLANEOUS) ×3 IMPLANT
CANISTER SUCT 1200ML W/VALVE (MISCELLANEOUS) ×3 IMPLANT
CUFF TOURN 18 STER (MISCELLANEOUS) ×3 IMPLANT
CUFF TOURN DUAL PL 12 NO SLV (MISCELLANEOUS) IMPLANT
DRAPE FLUOR MINI C-ARM 54X84 (DRAPES) IMPLANT
DRSG MEPITEL 4X7.2 (GAUZE/BANDAGES/DRESSINGS) ×3 IMPLANT
DURAPREP 26ML APPLICATOR (WOUND CARE) ×6 IMPLANT
ELECT REM PT RETURN 9FT ADLT (ELECTROSURGICAL) ×3
ELECTRODE REM PT RTRN 9FT ADLT (ELECTROSURGICAL) ×1 IMPLANT
GAUZE PETRO XEROFOAM 1X8 (MISCELLANEOUS) ×3 IMPLANT
GAUZE SPONGE 4X4 12PLY STRL (GAUZE/BANDAGES/DRESSINGS) ×3 IMPLANT
GAUZE STRETCH 2X75IN STRL (MISCELLANEOUS) ×3 IMPLANT
GLOVE BIO SURGEON STRL SZ7.5 (GLOVE) ×3 IMPLANT
GLOVE INDICATOR 8.0 STRL GRN (GLOVE) ×3 IMPLANT
GOWN STRL REUS W/ TWL LRG LVL3 (GOWN DISPOSABLE) ×2 IMPLANT
GOWN STRL REUS W/TWL LRG LVL3 (GOWN DISPOSABLE) ×4
HANDLE YANKAUER SUCT BULB TIP (MISCELLANEOUS) ×3 IMPLANT
HANDPIECE VERSAJET DEBRIDEMENT (MISCELLANEOUS) ×3 IMPLANT
KIT STIMULAN RAPID CURE 5CC (Orthopedic Implant) ×6 IMPLANT
LABEL OR SOLS (LABEL) ×3 IMPLANT
NEEDLE FILTER BLUNT 18X 1/2SAF (NEEDLE) ×2
NEEDLE FILTER BLUNT 18X1 1/2 (NEEDLE) ×1 IMPLANT
NEEDLE HYPO 25X1 1.5 SAFETY (NEEDLE) IMPLANT
NS IRRIG 500ML POUR BTL (IV SOLUTION) ×3 IMPLANT
PACK EXTREMITY ARMC (MISCELLANEOUS) ×3 IMPLANT
PAD ABD DERMACEA PRESS 5X9 (GAUZE/BANDAGES/DRESSINGS) ×6 IMPLANT
PENCIL ELECTRO HAND CTR (MISCELLANEOUS) ×3 IMPLANT
RASP SM TEAR CROSS CUT (RASP) ×3 IMPLANT
SOL PREP PVP 2OZ (MISCELLANEOUS) ×3
SOLUTION PREP PVP 2OZ (MISCELLANEOUS) ×1 IMPLANT
SPONGE XRAY 4X4 16PLY STRL (MISCELLANEOUS) ×3 IMPLANT
STAPLER SKIN PROX 35W (STAPLE) ×3 IMPLANT
STOCKINETTE STRL 4IN 9604848 (GAUZE/BANDAGES/DRESSINGS) ×3 IMPLANT
STOCKINETTE STRL 6IN 960660 (GAUZE/BANDAGES/DRESSINGS) ×3 IMPLANT
SUT ETHILON 4-0 (SUTURE) ×2
SUT ETHILON 4-0 FS2 18XMFL BLK (SUTURE) ×1
SUT VIC AB 3-0 SH 27 (SUTURE)
SUT VIC AB 3-0 SH 27X BRD (SUTURE) IMPLANT
SUT VIC AB 4-0 FS2 27 (SUTURE) IMPLANT
SUTURE ETHLN 4-0 FS2 18XMF BLK (SUTURE) ×1 IMPLANT
SYR 3ML LL SCALE MARK (SYRINGE) ×3 IMPLANT
SYRINGE 10CC LL (SYRINGE) ×6 IMPLANT

## 2016-10-31 NOTE — Progress Notes (Signed)
CH responded to a consult for AD. [place] met pt and pt's family were at the bedside. [place] educated pt on how to complete advanced directive and pt indicated he was ready to complete AD. [place] contacted the public notary, gathered 2 witness and helped pt complete AD. [place] placed a copy of AD in pt's chart and gave original and 2 extra copies to pt. CH also offered encouragement and prayers for pt's surgery that will be done at 7:00pm. Suggestion: Pt has a large family and friends who are visiting with him and may need a larger room to accommodate his visitors.    10/31/16 1600  Clinical Encounter Type  Visited With Patient and family together  Visit Type Initial;Other (Comment)  Referral From Nurse  Consult/Referral To Chaplain  Spiritual Encounters  Spiritual Needs Literature;Brochure;Prayer  Stress Factors  Patient Stress Factors Health changes  Family Stress Factors Health changes  Advance Directives (For Healthcare)  Does Patient Have a Medical Advance Directive? Yes  Does patient want to make changes to medical advance directive? Yes (Inpatient - patient requests chaplain consult to change a medical advance directive)  Type of Advance Directive [place];Living will  Copy of [place] in Chart? Yes  Copy of Living Will in Chart? Yes  Would patient like information on creating a medical advance directive? Yes (Inpatient - patient requests chaplain consult to create a medical advance directive)  [place] Directives  Does Patient Have a Mental Health Advance Directive? Yes;No  Does patient want to make changes to mental health advance directive? No - Patient declined  Copy of Mental Health Advance Directive in Chart? No - copy requested  Would patient like information on creating a mental health advance directive? No - Patient declined

## 2016-10-31 NOTE — Consult Note (Signed)
Reason for Consult: Osteoarthritis right first metatarsal Referring Physician: Eldrick Penick is an 61 y.o. male.  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 Also procedures for revascularization for limb salvage. [facility]. Presented outpatient today with exposed bone and radiographic evidence of osteomyelitis and was admitted for debridement of infected bone on the right foot as well as re-debridement of the continued ulcerative areas on the left foot  Past Medical History:  Diagnosis Date  . Diabetes mellitus ([place])     Past Surgical History:  Procedure Laterality Date  . AMPUTATION TOE Bilateral 09/23/2016   Procedure: AMPUTATION TOE;  Surgeon: Samara Deist, DPM;  Location: [facility] ORS;  Service: Podiatry;  Laterality: Bilateral;  . AMPUTATION TOE Left 10/03/2016   Procedure: AMPUTATION TOE-left metatarsal;  Surgeon: Sharlotte Alamo, DPM;  Location: [facility] ORS;  Service: Podiatry;  Laterality: Left;  . INCISION AND DRAINAGE Left 09/23/2016   Procedure: INCISION AND DRAINAGE;  Surgeon: Samara Deist, DPM;  Location: [facility] ORS;  Service: Podiatry;  Laterality: Left;  . IRRIGATION AND DEBRIDEMENT FOOT Left 09/28/2016   Procedure: IRRIGATION AND DEBRIDEMENT FOOT;  Surgeon: Samara Deist, DPM;  Location: [facility] ORS;  Service: Podiatry;  Laterality: Left;  . IRRIGATION AND DEBRIDEMENT FOOT Left 10/03/2016   Procedure: IRRIGATION AND DEBRIDEMENT FOOT;  Surgeon: Sharlotte Alamo, DPM;  Location: [facility] ORS;  Service: Podiatry;  Laterality: Left;  . IRRIGATION AND DEBRIDEMENT FOOT Right 10/05/2016   Procedure: IRRIGATION AND DEBRIDEMENT FOOT;  Surgeon: Sharlotte Alamo, DPM;  Location: [facility] ORS;  Service: Podiatry;  Laterality: Right;  . JOINT REPLACEMENT     bilateral knee replacements miller and hower pt unsure of dates  . PERIPHERAL VASCULAR INTERVENTION Left 09/26/2016    Procedure: Peripheral Vascular Intervention;  Surgeon: Katha Cabal, MD;  Location: [facility];  Service: Cardiovascular;  Laterality: Left;    History reviewed. No pertinent family history.  Social History:  reports that he has never smoked. He has never used smokeless tobacco. He reports that he does not drink alcohol or use drugs.  Allergies: No Known Allergies  Medications: Scheduled:   Results for orders placed or performed during the hospital encounter of 10/31/16 (from the past 48 hour(s))  Glucose, capillary     Status: Abnormal   Collection Time: 10/31/16 12:12 PM  Result Value Ref Range   Glucose-Capillary 132 (H) 65 - 99 mg/dL    No results found.  Review of Systems  Constitutional: Negative for chills and weight loss.  HENT: Negative.   Eyes: Negative.   Respiratory: Negative.   Cardiovascular: Negative.   Gastrointestinal: Negative for nausea and vomiting.  Genitourinary: Negative.   Musculoskeletal: Negative.   Skin:       Chronic draining wounds on both feet with previous surgery several weeks ago  Neurological:       Some numbness and paresthesias related to his diabetes.  Endo/Heme/Allergies: Negative.   Psychiatric/Behavioral: Negative.    Blood pressure (!) 146/91, pulse 86, temperature 98 F (36.7 C), temperature source Oral, resp. rate 18, height 5\' 11"  (1.803 m), weight 112 kg (246 lb 14.6 oz), SpO2 99 %. Physical Exam  Cardiovascular:  Pedal pulses are diminished.  Musculoskeletal:  Previous amputation of the hallux bilateral and left first metatarsal. Muscle testing not performed.  Neurological:  Loss of protective threshold and epicritic sensations in the feet  Skin:  Opened his wound  on the right foot at the hallux and rotation side with exposed bone at the medial head of the first metatarsal. Multiple open ulcerative areas on the left foot from previous surgery with some fibrotic eschar and necrosis along the dorsal aspect  and the proximal midfoot incision. Moderate drainage is noted from these areas.    Assessment/Plan: Assessment: 1. Osteomyelitis right first metatarsal. 2. Chronic wounds left foot status post multiple debridements. 3. Diabetes with peripheral vascular disease.  Plan: Patient is admitted for surgical debridement. Patient will remain nothing by mouth as his surgery will be scheduled for tonight. We will obtain consent for debridement of infected bone right foot as well as re-debridement of the ulcerations and abscess of the left foot. Risks and benefits were explained to the patient including inability to heal due to his vascular status or diabetes. Understands the risk for amputation. Plan for surgery this evening  Durward Fortes 10/31/2016, 1:24 PM

## 2016-10-31 NOTE — Anesthesia Post-op Follow-up Note (Cosign Needed)
Anesthesia QCDR form completed.        

## 2016-10-31 NOTE — Transfer of Care (Signed)
Immediate Anesthesia Transfer of Care Note  Patient: Kyle Banks  Procedure(s) Performed: Procedure(s): IRRIGATION AND DEBRIDEMENT FOOT (Bilateral)  Patient Location: PACU  Anesthesia Type:General  Level of Consciousness: awake, alert , oriented and patient cooperative  Airway & Oxygen Therapy: Patient Spontanous Breathing and Patient connected to face mask oxygen  Post-op Assessment: Report given to RN and Post -op Vital signs reviewed and stable  Post vital signs: Reviewed and stable  Last Vitals:  Vitals:   10/31/16 1135 10/31/16 1908  BP: (!) 146/91 135/87  Pulse: 86 88  Resp: 18 12  Temp: 36.7 C (!) 36.2 C    Last Pain:  Vitals:   10/31/16 1135  TempSrc: Oral  PainSc:          Complications: No apparent anesthesia complications

## 2016-10-31 NOTE — NC FL2 (Signed)
[place] LEVEL OF CARE SCREENING TOOL     IDENTIFICATION  Patient Name: Kyle Banks Birthdate: [birth year] Sex: male Admission Date (Current Location): 10/31/2016  [place] and [place] Number:  Engineering geologist and Address:  [facility], [address], [place], [place] [address]      Provider Number: [id number]  Attending Physician Name and Address:  Demetrios Loll, MD  Relative Name and Phone Number:       Current Level of Care: Hospital Recommended Level of Care: [place] Prior Approval Number:    Date Approved/Denied:   PASRR Number:  [id number])  Discharge Plan: SNF    Current Diagnoses: Patient Active Problem List   Diagnosis Date Noted  . Left foot infection 10/31/2016  . Sepsis due to cellulitis ([place]) 09/23/2016    Orientation RESPIRATION BLADDER Height & Weight     Self, Time, Situation, Place  Normal Continent Weight: 246 lb 14.6 oz (112 kg) Height:  5\' 11"  (180.3 cm)  BEHAVIORAL SYMPTOMS/MOOD NEUROLOGICAL BOWEL NUTRITION STATUS   (none)  (none) Continent Diet (NPO for surgery to be advanced. )  AMBULATORY STATUS COMMUNICATION OF NEEDS Skin   Extensive Assist Verbally Surgical wounds (Osteoarthritis right first metatarsal, Chronic wounds left foot)                       Personal Care Assistance Level of Assistance  Bathing, Feeding, Dressing Bathing Assistance: Limited assistance Feeding assistance: Independent Dressing Assistance: Limited assistance     Functional Limitations Info  Sight, Hearing, Speech Sight Info: Adequate Hearing Info: Adequate Speech Info: Adequate    SPECIAL CARE FACTORS FREQUENCY  PT (By licensed PT), OT (By licensed OT)     PT Frequency:  (5) OT Frequency:  (5)            Contractures      Additional Factors Info  Code Status, Allergies Code Status Info:  (Full Code. ) Allergies Info:  (No Known Allergies. )           Current Medications  (10/31/2016):  This is the current hospital active medication list Current Facility-Administered Medications  Medication Dose Route Frequency Provider Last Rate Last Dose  . 0.9 %  sodium chloride infusion   Intravenous Continuous Demetrios Loll, MD 75 mL/hr at 10/31/16 1339    . acetaminophen (TYLENOL) tablet 650 mg  650 mg Oral Q6H PRN Demetrios Loll, MD       Or  . acetaminophen (TYLENOL) suppository 650 mg  650 mg Rectal Q6H PRN Demetrios Loll, MD      . albuterol (PROVENTIL) (2.5 MG/3ML) 0.083% nebulizer solution 2.5 mg  2.5 mg Nebulization Q2H PRN Demetrios Loll, MD      . bisacodyl (DULCOLAX) EC tablet 5 mg  5 mg Oral Daily PRN Demetrios Loll, MD      . HYDROcodone-acetaminophen (NORCO/VICODIN) 5-325 MG per tablet 1-2 tablet  1-2 tablet Oral Q4H PRN Demetrios Loll, MD      . ketorolac (TORADOL) 30 MG/ML injection 30 mg  30 mg Intravenous Q6H PRN Demetrios Loll, MD      . ondansetron [facility]) tablet 4 mg  4 mg Oral Q6H PRN Demetrios Loll, MD       Or  . ondansetron [facility]) injection 4 mg  4 mg Intravenous Q6H PRN Demetrios Loll, MD      . senna-docusate (Senokot-S) tablet 1 tablet  1 tablet Oral QHS PRN Demetrios Loll, MD  Discharge Medications: Please see discharge summary for a list of discharge medications.  Relevant Imaging Results:  Relevant Lab Results:   Additional Information  (SSN: [id number])  Malosi Hemstreet, Veronia Beets, LCSW

## 2016-10-31 NOTE — Progress Notes (Signed)
Pharmacy Antibiotic Note  Matson Welch is a 61 y.o. male admitted on 10/31/2016 with left foot infection.  Pharmacy has been consulted for Zosyn dosing.  Plan: Zosyn 3.375g IV q8h (4 hour infusion).  Height: 5\' 11"  (180.3 cm) Weight: 246 lb 14.6 oz (112 kg) IBW/kg (Calculated) : 75.3  Temp (24hrs), Avg:98 F (36.7 C), Min:98 F (36.7 C), Max:98 F (36.7 C)   Recent Labs Lab 10/31/16 1335  WBC 5.5  CREATININE 0.99    Estimated Creatinine Clearance: 99.7 mL/min (by C-G formula based on SCr of 0.99 mg/dL).    No Known Allergies  Antimicrobials this admission: Zosyn 7/18 >>  Dose adjustments this admission:   Microbiology results:   Thank you for allowing pharmacy to be a part of this patient's care.  Napoleon Form 10/31/2016 4:39 PM

## 2016-10-31 NOTE — Anesthesia Preprocedure Evaluation (Signed)
Anesthesia Evaluation  Patient identified by MRN, date of birth, ID band Patient awake    Reviewed: Allergy & Precautions, NPO status , Patient's Chart, lab work & pertinent test results, reviewed documented beta blocker date and time   History of Anesthesia Complications Negative for: history of anesthetic complications  Airway Mallampati: II  TM Distance: >3 FB Neck ROM: Full    Dental no notable dental hx. (+)    Pulmonary neg pulmonary ROS, Sleep apnea: suggestive hx but not diagnosed. , neg COPD,    breath sounds clear to auscultation- rhonchi (-) wheezing      Cardiovascular Exercise Tolerance: Good (-) hypertension(-) CAD and (-) Past MI  Rhythm:Regular Rate:Normal - Systolic murmurs and - Diastolic murmurs    Neuro/Psych negative neurological ROS  negative psych ROS   GI/Hepatic negative GI ROS, Neg liver ROS,   Endo/Other  diabetes, Insulin Dependent  Renal/GU negative Renal ROS     Musculoskeletal negative musculoskeletal ROS (+)   Abdominal (+) + obese,   Peds  Hematology  (+) anemia ,   Anesthesia Other Findings   Reproductive/Obstetrics                             Anesthesia Physical  Anesthesia Plan  ASA: II  Anesthesia Plan: General   Post-op Pain Management:    Induction: Intravenous  PONV Risk Score and Plan: 1 and Ondansetron, Dexamethasone, Propofol and Midazolam  Airway Management Planned: LMA  Additional Equipment:   Intra-op Plan:   Post-operative Plan:   Informed Consent: I have reviewed the patients History and Physical, chart, labs and discussed the procedure including the risks, benefits and alternatives for the proposed anesthesia with the patient or authorized representative who has indicated his/her understanding and acceptance.     Plan Discussed with: CRNA and Anesthesiologist  Anesthesia Plan Comments:         Anesthesia Quick  Evaluation

## 2016-10-31 NOTE — Anesthesia Postprocedure Evaluation (Signed)
Anesthesia Post Note  Patient: Cordarryl Monrreal  Procedure(s) Performed: Procedure(s) (LRB): IRRIGATION AND DEBRIDEMENT FOOT (Bilateral)  Patient location during evaluation: PACU Anesthesia Type: General Level of consciousness: awake and alert and oriented Pain management: pain level controlled Vital Signs Assessment: post-procedure vital signs reviewed and stable Respiratory status: spontaneous breathing, nonlabored ventilation and respiratory function stable Cardiovascular status: blood pressure returned to baseline and stable Postop Assessment: no signs of nausea or vomiting Anesthetic complications: no     Last Vitals:  Vitals:   10/31/16 1923 10/31/16 1938  BP: (!) 154/93 (!) 155/91  Pulse: 88 86  Resp: 15 15  Temp:      Last Pain:  Vitals:   10/31/16 1938  TempSrc:   PainSc: 0-No pain                 Dallis Czaja

## 2016-10-31 NOTE — H&P (Signed)
[place] [facility] [place] NAME: Kyle Banks    MR#:  [id number]  DATE OF BIRTH:  13-Apr-1956  DATE OF ADMISSION:  10/31/2016  PRIMARY CARE PHYSICIAN: Patient, No Pcp Per   REQUESTING/REFERRING PHYSICIAN: Sharlotte Alamo, DPM  CHIEF COMPLAINT:  No chief complaint on file.  Bilateral foot infection. HISTORY OF PRESENT ILLNESS:  Kyle Banks  is a 61 y.o. male with a known history of Diabetes and osteomyelitis. He was sent from [facility] to the hospital for direct admission by Dr. Cleda Mccreedy. 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 He saw Dr. Cleda Mccreedy today as outpatient today with exposed bone and radiographic evidence of osteomyelitis. Dr. Cleda Mccreedy sent patient to the hospital for debridement of infected bone on the right foot as well as left foot.  PAST MEDICAL HISTORY:   Past Medical History:  Diagnosis Date  . Anemia   . Diabetes mellitus ([place])   . Osteomyelitis of foot ([place])     PAST SURGICAL HISTORY:   Past Surgical History:  Procedure Laterality Date  . AMPUTATION TOE Bilateral 09/23/2016   Procedure: AMPUTATION TOE;  Surgeon: Samara Deist, DPM;  Location: [facility] OR[facility];  Service: Podiatry;  Laterality: Bilateral;  . AMPUTATION TOE Left 10/03/2016   Procedure: AMPUTATION TOE-left metatarsal;  Surgeon: Sharlotte Alamo, DPM;  Location: [facility] OR[facility];  Service: Podiatry;  Laterality: Left;  . INCISION AND DRAINAGE Left 09/23/2016   Procedure: INCISION AND DRAINAGE;  Surgeon: Samara Deist, DPM;  Location: [facility] OR[facility];  Service: Podiatry;  Laterality: Left;  . IRRIGATION AND DEBRIDEMENT FOOT Left 09/28/2016   Procedure: IRRIGATION AND DEBRIDEMENT FOOT;  Surgeon: Samara Deist, DPM;  Location: [facility] OR[facility];  Service: Podiatry;  Laterality: Left;  . IRRIGATION AND DEBRIDEMENT FOOT Left 10/03/2016   Procedure: IRRIGATION AND DEBRIDEMENT FOOT;  Surgeon: Sharlotte Alamo, DPM;  Location: [facility];  Service:  Podiatry;  Laterality: Left;  . IRRIGATION AND DEBRIDEMENT FOOT Right 10/05/2016   Procedure: IRRIGATION AND DEBRIDEMENT FOOT;  Surgeon: Sharlotte Alamo, DPM;  Location: [facility];  Service: Podiatry;  Laterality: Right;  . JOINT REPLACEMENT     bilateral knee replacements miller and hower pt unsure of dates  . PERIPHERAL VASCULAR INTERVENTION Left 09/26/2016   Procedure: Peripheral Vascular Intervention;  Surgeon: Katha Cabal, MD;  Location: [place] [facility];  Service: Cardiovascular;  Laterality: Left;    SOCIAL HISTORY:   Social History  Substance Use Topics  . Smoking status: Never Smoker  . Smokeless tobacco: Never Used  . Alcohol use No    FAMILY HISTORY:   Family History  Problem Relation Age of Onset  . Stroke Mother   . Diabetes Maternal Grandmother   DM.  DRUG ALLERGIES:  No Known Allergies  REVIEW OF SYSTEMS:   Review of Systems  Constitutional: Negative for chills, fever and malaise/fatigue.  HENT: Negative for sore throat.   Eyes: Negative for blurred vision and double vision.  Respiratory: Negative for cough, shortness of breath and stridor.   Cardiovascular: Negative for chest pain, palpitations and leg swelling.  Gastrointestinal: Negative for abdominal pain, blood in stool, heartburn, melena, nausea and vomiting.  Genitourinary: Negative for dysuria and hematuria.  Musculoskeletal: Negative for back pain and joint pain.  Skin: Negative for itching and rash.  Neurological: Negative for dizziness, focal weakness, loss of consciousness, weakness and headaches.  Endo/Heme/Allergies: Negative for polydipsia.  Psychiatric/Behavioral: Negative for depression. The patient is not nervous/anxious.  MEDICATIONS AT HOME:   Prior to Admission medications   Medication Sig Start Date End Date Taking? Authorizing Provider  aspirin EC 81 MG EC tablet Take 1 tablet (81 mg total) by mouth daily. 10/11/16   Loletha Grayer, MD  cyanocobalamin 500 MCG tablet Take  500 mcg by mouth daily.    [provider]  enoxaparin (LOVENOX) 40 MG/0.4ML injection Inject 0.4 mLs (40 mg total) into the skin daily. 10/10/16   Loletha Grayer, MD  fenofibrate 160 MG tablet Take 1 tablet (160 mg total) by mouth daily. 10/11/16   Loletha Grayer, MD  ferrous sulfate 325 (65 FE) MG tablet Take 1 tablet (325 mg total) by mouth 2 (two) times daily with a meal. 10/10/16   Leslye Peer, Richard, MD  Garlic 10 MG CAPS Take 1 tablet by mouth daily.    [provider]  insulin aspart (NOVOLOG) 100 UNIT/ML injection Inject 5 Units into the skin 3 (three) times daily with meals. 10/10/16   Loletha Grayer, MD  insulin glargine (LANTUS) 100 UNIT/ML injection Inject 0.26 mLs (26 Units total) into the skin daily. 10/11/16   Loletha Grayer, MD  nutrition supplement, JUVEN, (JUVEN) PACK Take 1 packet by mouth 2 (two) times daily between meals. 10/10/16   Loletha Grayer, MD  Omega-3 Fatty Acids (FISH OIL) 1000 MG CAPS Take 1 capsule by mouth daily.    [provider]  oxyCODONE-acetaminophen (PERCOCET/ROXICET) 5-325 MG tablet Take 1 tablet by mouth every 4 (four) hours as needed for severe pain. 10/10/16   Loletha Grayer, MD  piperacillin-tazobactam (ZOSYN) 3.375 GM/50ML IVPB 3.375 gm iv every eight hours through July 13th 10/10/16   Loletha Grayer, MD  protein supplement shake (PREMIER PROTEIN) LIQD Take 325 mLs (11 oz total) by mouth daily. 10/10/16   Loletha Grayer, MD  senna-docusate (SENOKOT-S) 8.6-50 MG tablet Take 2 tablets by mouth 2 (two) times daily. 10/10/16   Loletha Grayer, MD      VITAL SIGNS:  Blood pressure (!) 146/91, pulse 86, temperature 98 F (36.7 C), temperature source Oral, resp. rate 18, height 5\' 11"  (1.803 m), weight 246 lb 14.6 oz (112 kg), SpO2 99 %.  PHYSICAL EXAMINATION:  Physical Exam  GENERAL:  61 y.o.-year-old patient lying in the bed with no acute distress.  EYES: Pupils equal, round, reactive to light and accommodation. No  scleral icterus. Extraocular muscles intact.  HEENT: Head atraumatic, normocephalic. Oropharynx and nasopharynx clear.  NECK:  Supple, no jugular venous distention. No thyroid enlargement, no tenderness.  LUNGS: Normal breath sounds bilaterally, no wheezing, rales,rhonchi or crepitation. No use of accessory muscles of respiration.  CARDIOVASCULAR: S1, S2 normal. No murmurs, rubs, or gallops.  ABDOMEN: Soft, nontender, nondistended. Bowel sounds present. No organomegaly or mass.  EXTREMITIES: No pedal edema, cyanosis, or clubbing. Bilateral foot in dressing. NEUROLOGIC: Cranial nerves II through XII are intact. Muscle strength 5/5 in all extremities. Sensation intact. Gait not checked.  PSYCHIATRIC: The patient is alert and oriented x 3.  SKIN: No obvious rash, lesion, or ulcer.   LABORATORY PANEL:   CBC  Recent Labs Lab 10/31/16 1335  WBC 5.5  HGB 10.1*  HCT 30.9*  PLT 279   ------------------------------------------------------------------------------------------------------------------  Chemistries   Recent Labs Lab 10/31/16 1335  NA 137  K 3.7  CL 106  CO2 24  GLUCOSE 141*  BUN 18  CREATININE 0.99  CALCIUM 9.2  MG 1.7  AST 14*  ALT 11*  ALKPHOS 41  BILITOT 0.5   ------------------------------------------------------------------------------------------------------------------  Cardiac Enzymes  No results for input(s): TROPONINI in the last 168 hours. ------------------------------------------------------------------------------------------------------------------  RADIOLOGY:  No results found.    IMPRESSION AND PLAN:   Bilateral foot infection The patient is admitted to medical floor. Continue Zosyn, follow-up with Dr. Cleda Mccreedy for foot surgery today. Follow up wound culture and a CBC. Hold aspirin.  Diabetes. Start sliding scale and continue Lantus. Anemia of chronic disease. Stable.  I discussed with Dr. Cleda Mccreedy. All the records are reviewed and case  discussed with ED provider. Management plans discussed with the patient, family and they are in agreement.  CODE STATUS: Full code  TOTAL TIME TAKING CARE OF THIS PATIENT: 53 minutes.    Demetrios Loll M.D on 10/31/2016 at 2:39 PM  Between 7am to 6pm - Pager - [phone]  After 6pm go to [email or web address] - Proofreader  [facility]  Office  [phone]  CC: Primary care physician; Patient, No Pcp Per   Note: This dictation was prepared with Dragon dictation along with smaller phrase technology. Any transcriptional errors that result from this process are unintentional.

## 2016-10-31 NOTE — Op Note (Signed)
Date of operation: 10/31/2016.  Surgeon: Durward Fortes DPM.  Preoperative diagnosis: 1. Osteomyelitis right first metatarsal. 2. Chronic full-thickness ulcerations with abscess left foot status post multiple debridements.  Postoperative diagnosis: Same.  Procedures: 1. Debridement bone and soft tissue right forefoot. 2. Full-thickness excisional debridement soft tissue wounds left foot, multiple sites  Anesthesia: LMA.  Hemostasis: None.  Estimated blood loss: 50 cc.  Pathology: Right first metatarsal head.  Cultures: Bone cultures right first metatarsal.  Implants: Stimulan rapid cure antibiotic beads impregnated with vancomycin.  Consultations: None apparent.  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 Dehiscence of the amputation site on the right foot with exposed first metatarsal head with radiographic evidence for osteomyelitis. Also chronic wounds on the left foot status post multiple debridements.  Operative procedure: Patient was taken to the operating room and placed on the table in the supine position. Upon satisfactory LMA anesthesia the feet were prepped and draped in usual sterile fashion. Attention was directed to the right foot where the open wound was present with the exposed first metatarsal head. An approximate 1.5 cm linear incision was made along the medial aspect of the first metatarsal proximal to the open area. Tissue was dissected away from the first metatarsal head which was then incised using a sagittal saw and removed in toto. The sesamoids were also removed from the flexor tendon area. The wound was then thoroughly debrided with a versa jet debrider on a setting of 5 and then thoroughly irrigated with the debrider on a setting of 2. The wound was then flushed with copious amounts of sterile saline and partially closed both proximal and distal with 3-0 nylon  simple interrupted sutures. Stimulan rapid cure antibiotic beads impregnated with vancomycin were then placed into the wound and the remainder of the wound was closed using 3-0 nylon simple interrupted sutures.     Attention was then directed to the left foot where the necrotic eschar covering the dorsum of the left foot as well as an area along the medial aspect of the right rear foot and distal aspect of the medial foot were sharply excised using a 15 blade and removed down to the level of the deeper subcutaneous tissue. Debridement was also performed full-thickness down through the deeper subcutaneous tissues at the area along the medial aspect of the second metatarsal from the previous first ray resection. Following this all of the underlying devitalized soft tissue was debrided with the versa jet debrider on a setting of 4 and then thoroughly irrigated with the debrider on a setting of 2. Stimulan rapid cure antibiotic beads were then placed into the defect around the distal first metatarsal amputation site as well as beneath a pocket in the lateral forefoot around the third and fourth metatarsal head area. A Mepitel dressing was then stapled over the opened wound and secured with surgical staples. Xeroform and a sterile bandage applied to the incision on the right foot as well as 4 x 4's ABDs and Kerlix to both feet. A second Kerlix and Ace wrap were then applied to both feet. Patient tolerated the procedure and anesthesia well and was transported to the PACU with vital signs stable and in good condition.

## 2016-10-31 NOTE — H&P (View-Only) (Signed)
Reason for Consult: Osteoarthritis right first metatarsal Referring Physician: Philip Kotlyar is an 61 y.o. male.  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 Also procedures for revascularization for limb salvage. Patient has been at [facility] for skilled nursing. Presented outpatient today with exposed bone and radiographic evidence of osteomyelitis and was admitted for debridement of infected bone on the right foot as well as re-debridement of the continued ulcerative areas on the left foot  Past Medical History:  Diagnosis Date  . Diabetes mellitus ([place])     Past Surgical History:  Procedure Laterality Date  . AMPUTATION TOE Bilateral 09/23/2016   Procedure: AMPUTATION TOE;  Surgeon: Samara Deist, DPM;  Location: [facility];  Service: Podiatry;  Laterality: Bilateral;  . AMPUTATION TOE Left 10/03/2016   Procedure: AMPUTATION TOE-left metatarsal;  Surgeon: Sharlotte Alamo, DPM;  Location: [facility];  Service: Podiatry;  Laterality: Left;  . INCISION AND DRAINAGE Left 09/23/2016   Procedure: INCISION AND DRAINAGE;  Surgeon: Samara Deist, DPM;  Location: [facility];  Service: Podiatry;  Laterality: Left;  . IRRIGATION AND DEBRIDEMENT FOOT Left 09/28/2016   Procedure: IRRIGATION AND DEBRIDEMENT FOOT;  Surgeon: Samara Deist, DPM;  Location: [facility];  Service: Podiatry;  Laterality: Left;  . IRRIGATION AND DEBRIDEMENT FOOT Left 10/03/2016   Procedure: IRRIGATION AND DEBRIDEMENT FOOT;  Surgeon: Sharlotte Alamo, DPM;  Location: [facility];  Service: Podiatry;  Laterality: Left;  . IRRIGATION AND DEBRIDEMENT FOOT Right 10/05/2016   Procedure: IRRIGATION AND DEBRIDEMENT FOOT;  Surgeon: Sharlotte Alamo, DPM;  Location: [facility];  Service: Podiatry;  Laterality: Right;  . JOINT REPLACEMENT     bilateral knee replacements miller and hower pt unsure of dates  . PERIPHERAL VASCULAR INTERVENTION Left 09/26/2016    Procedure: Peripheral Vascular Intervention;  Surgeon: Katha Cabal, MD;  Location: [place] [facility];  Service: Cardiovascular;  Laterality: Left;    History reviewed. No pertinent family history.  Social History:  reports that he has never smoked. He has never used smokeless tobacco. He reports that he does not drink alcohol or use drugs.  Allergies: No Known Allergies  Medications: Scheduled:   Results for orders placed or performed during the hospital encounter of 10/31/16 (from the past 48 hour(s))  Glucose, capillary     Status: Abnormal   Collection Time: 10/31/16 12:12 PM  Result Value Ref Range   Glucose-Capillary 132 (H) 65 - 99 mg/dL    No results found.  Review of Systems  Constitutional: Negative for chills and weight loss.  HENT: Negative.   Eyes: Negative.   Respiratory: Negative.   Cardiovascular: Negative.   Gastrointestinal: Negative for nausea and vomiting.  Genitourinary: Negative.   Musculoskeletal: Negative.   Skin:       Chronic draining wounds on both feet with previous surgery several weeks ago  Neurological:       Some numbness and paresthesias related to his diabetes.  Endo/Heme/Allergies: Negative.   Psychiatric/Behavioral: Negative.    Blood pressure (!) 146/91, pulse 86, temperature 98 F (36.7 C), temperature source Oral, resp. rate 18, height 5\' 11"  (1.803 m), weight 112 kg (246 lb 14.6 oz), SpO2 99 %. Physical Exam  Cardiovascular:  Pedal pulses are diminished.  Musculoskeletal:  Previous amputation of the hallux bilateral and left first metatarsal. Muscle testing not performed.  Neurological:  Loss of protective threshold and epicritic sensations in the feet  Skin:  Opened his wound  on the right foot at the hallux and rotation side with exposed bone at the medial head of the first metatarsal. Multiple open ulcerative areas on the left foot from previous surgery with some fibrotic eschar and necrosis along the dorsal aspect  and the proximal midfoot incision. Moderate drainage is noted from these areas.    Assessment/Plan: Assessment: 1. Osteomyelitis right first metatarsal. 2. Chronic wounds left foot status post multiple debridements. 3. Diabetes with peripheral vascular disease.  Plan: Patient is admitted for surgical debridement. Patient will remain nothing by mouth as his surgery will be scheduled for tonight. We will obtain consent for debridement of infected bone right foot as well as re-debridement of the ulcerations and abscess of the left foot. Risks and benefits were explained to the patient including inability to heal due to his vascular status or diabetes. Understands the risk for amputation. Plan for surgery this evening  Durward Fortes 10/31/2016, 1:24 PM

## 2016-10-31 NOTE — Interval H&P Note (Signed)
History and Physical Interval Note:  10/31/2016 5:31 PM  Kyle Banks  has presented today for surgery, with the diagnosis of n/a  The various methods of treatment have been discussed with the patient and family. After consideration of risks, benefits and other options for treatment, the patient has consented to  Procedure(s): IRRIGATION AND DEBRIDEMENT FOOT (Bilateral) as a surgical intervention .  The patient's history has been reviewed, patient examined, no change in status, stable for surgery.  I have reviewed the patient's chart and labs.  Questions were answered to the patient's satisfaction.     Durward Fortes

## 2016-10-31 NOTE — Clinical Social Work Note (Signed)
Clinical Social Work Assessment  Patient Details  Name: Kyle Banks MRN: [id number] Date of Birth: 04-May-1955  Date of referral:  10/31/16               Reason for consult:  Other (Comment Required) (From [facility] STR )                Permission sought to share information with:  Chartered certified accountant granted to share information::  Yes, Verbal Permission Granted  Name::        Agency::     Relationship::     Contact Information:     Housing/Transportation Living arrangements for the past 2 months:  [place], [place] of Information:  Patient Patient Interpreter Needed:  None Criminal Activity/Legal Involvement Pertinent to Current Situation/Hospitalization:  No - Comment as needed Significant Relationships:  Adult Children, Other Family Members Lives with:  Self Do you feel safe going back to the place where you live?  Yes Need for family participation in patient care:  Yes (Comment)  Care giving concerns:  Patient came to [facility] from [facility], where he was at for short term rehab.    Social Worker assessment / plan:  Holiday representative (CSW) reviewed chart and noted that patient is from [facility]. Per [facility] liaison patient is from [facility] and will need a new [facility] SN[facility] authorization before returning. Per Broadus John he will start [place] authorization as soon at PT note is available. CSW met with patient at bedside to discuss D/C plan. Patient was alert and oriented X4 and was in a good mood. Patient is familiar with CSW from previous admission. Patient reported that he plans on returning to [facility] and then going home from there. Patient reported that he has a wheel chair ramp at his house. CSW explained to patient that a new [facility] SN[facility] authorization will be needed. Patient verbalized his understanding. FL2 complete. CSW will continue to follow and assist as needed.   Employment status:  Temporary, Part-Time Insurance information:  Managed  Care PT Recommendations:  Not assessed at this time Information / Referral to community resources:  [place]  Patient/Family's Response to care:  Patient is agreeable to return to [facility].   Patient/Family's Understanding of and Emotional Response to Diagnosis, Current Treatment, and Prognosis:  Patient was very pleasant and thanked CSW for assistance.   Emotional Assessment Appearance:  Appears stated age Attitude/Demeanor/Rapport:    Affect (typically observed):  Accepting, Adaptable, Pleasant Orientation:  Oriented to Self, Oriented to Place, Oriented to  Time, Oriented to Situation Alcohol / Substance use:  Not Applicable Psych involvement (Current and /or in the community):  No (Comment)  Discharge Needs  Concerns to be addressed:  Discharge Planning Concerns Readmission within the last 30 days:  No Current discharge risk:  Dependent with Mobility Barriers to Discharge:  Continued Medical Work up   [employer], Veronia Beets, LCSW 10/31/2016, 4:05 PM

## 2016-10-31 NOTE — Anesthesia Procedure Notes (Signed)
Procedure Name: LMA Insertion Date/Time: 10/31/2016 5:57 PM Performed by: Lendon Colonel Pre-anesthesia Checklist: Patient identified, Patient being monitored, Timeout performed, Emergency Drugs available and Suction available Patient Re-evaluated:Patient Re-evaluated prior to induction Oxygen Delivery Method: Circle system utilized Preoxygenation: Pre-oxygenation with 100% oxygen Induction Type: IV induction Ventilation: Mask ventilation without difficulty LMA: LMA inserted Tube type: Oral Number of attempts: 1 Placement Confirmation: positive ETCO2 and breath sounds checked- equal and bilateral Tube secured with: Tape Dental Injury: Teeth and Oropharynx as per pre-operative assessment

## 2016-11-01 ENCOUNTER — Encounter: Payer: Self-pay | Admitting: Podiatry

## 2016-11-01 LAB — CBC
HCT: 30.4 % — ABNORMAL LOW (ref 40.0–52.0)
HEMOGLOBIN: 10.1 g/dL — AB (ref 13.0–18.0)
MCH: 26.9 pg (ref 26.0–34.0)
MCHC: 33.2 g/dL (ref 32.0–36.0)
MCV: 81.1 fL (ref 80.0–100.0)
PLATELETS: 296 10*3/uL (ref 150–440)
RBC: 3.75 MIL/uL — AB (ref 4.40–5.90)
RDW: 15.4 % — ABNORMAL HIGH (ref 11.5–14.5)
WBC: 6.4 10*3/uL (ref 3.8–10.6)

## 2016-11-01 LAB — BASIC METABOLIC PANEL
ANION GAP: 4 — AB (ref 5–15)
BUN: 16 mg/dL (ref 6–20)
CALCIUM: 8.9 mg/dL (ref 8.9–10.3)
CHLORIDE: 106 mmol/L (ref 101–111)
CO2: 26 mmol/L (ref 22–32)
CREATININE: 0.87 mg/dL (ref 0.61–1.24)
GFR calc non Af Amer: >60 mL/min (ref >60)
Glucose, Bld: 230 mg/dL — ABNORMAL HIGH (ref 65–99)
Potassium: 4 mmol/L (ref 3.5–5.1)
SODIUM: 136 mmol/L (ref 135–145)

## 2016-11-01 LAB — GLUCOSE, CAPILLARY
GLUCOSE-CAPILLARY: 178 mg/dL — AB (ref 65–99)
Glucose-Capillary: 114 mg/dL — ABNORMAL HIGH (ref 65–99)
Glucose-Capillary: 174 mg/dL — ABNORMAL HIGH (ref 65–99)
Glucose-Capillary: 144 mg/dL — ABNORMAL HIGH (ref 65–99)

## 2016-11-01 LAB — MRSA PCR SCREENING: MRSA BY PCR: NEGATIVE

## 2016-11-01 LAB — HEMOGLOBIN A1C
Hgb A1c MFr Bld: 9.2 % — ABNORMAL HIGH (ref 4.8–5.6)
MEAN PLASMA GLUCOSE: 217 mg/dL

## 2016-11-01 MED ORDER — SODIUM CHLORIDE 0.9% FLUSH: 10.0000 mL | INTRAVENOUS | Status: DC | PRN

## 2016-11-01 MED ORDER — ENOXAPARIN SODIUM 40 MG/0.4ML ~~LOC~~ SOLN
40.0000 mg | SUBCUTANEOUS | Status: DC
  Administered 2016-11-01 – 2016-11-04 (×4): 40 mg via SUBCUTANEOUS
  Filled 2016-11-01 (×4): qty 0.4

## 2016-11-01 NOTE — Progress Notes (Signed)
A&OX4. Lower extremities elevated. Dressings clean dry intact. VSS. Popliteal pulses 3+ bilat. Pt is in good spirits and eating.

## 2016-11-01 NOTE — Progress Notes (Signed)
1 Day Post-Op   Subjective/Chief Complaint: Patient seen. Not really complaining of pain but he has had some significant bleeding through the bandage. States the nurse change this once   Objective: Vital signs in last 24 hours: Temp:  [97.1 F (36.2 C)-98.9 F (37.2 C)] 98.3 F (36.8 C) (07/19 1211) Pulse Rate:  [82-109] 93 (07/19 1211) Resp:  [12-20] 16 (07/19 1211) BP: (103-174)/(65-97) 145/86 (07/19 1211) SpO2:  [96 %-100 %] 100 % (07/19 1211) Last BM Date: 11/01/16  Intake/Output from previous day: 07/18 0701 - 07/19 0700 In: 1376.3 [I.V.:1376.3] Out: 850 [Urine:800; Blood:50] Intake/Output this shift: Total I/O In: 120 [P.O.:120] Out: -   Heavy bleeding still noted on the bandage on the right foot. Upon removal the incision is well coapted. Still some moderate edema. No purulence on the bandaging. Left foot bandages intact with no evidence of strikethrough.  Lab Results:   Recent Labs  10/31/16 1335 11/01/16 0530  WBC 5.5 6.4  HGB 10.1* 10.1*  HCT 30.9* 30.4*  PLT 279 296   BMET  Recent Labs  10/31/16 1335 11/01/16 0530  NA 137 136  K 3.7 4.0  CL 106 106  CO2 24 26  GLUCOSE 141* 230*  BUN 18 16  CREATININE 0.99 0.87  CALCIUM 9.2 8.9   PT/INR No results for input(s): LABPROT, INR in the last 72 hours. ABG No results for input(s): PHART, HCO3 in the last 72 hours.  Invalid input(s): PCO2, PO2  Studies/Results: No results found.  Anti-infectives: Anti-infectives    Start     Dose/Rate Route Frequency Ordered Stop   10/31/16 1730  piperacillin-tazobactam (ZOSYN) IVPB 3.375 g     3.375 g 12.5 mL/hr over 240 Minutes Intravenous Every 8 hours 10/31/16 1638        Assessment/Plan: s/p Procedure(s): IRRIGATION AND DEBRIDEMENT FOOT (Bilateral) Assessment: Stable status post debridement bilateral feet   Plan: Dry sterile dressing reapplied to the right foot. Patient may have bathroom privileges with assistance. We'll follow daily  LOS: 1 day     Durward Fortes 11/01/2016

## 2016-11-01 NOTE — Progress Notes (Signed)
Clinical Education officer, museum (CSW) sent PT note to [facility] today via [facility]. Per Alger Simons liaison he will start [facility] authorization today. CSW will continue to follow and assist as needed.   [employer], LCSW [phone]

## 2016-11-01 NOTE — Progress Notes (Signed)
[place] at [place] NAME: Kyle Banks    MR#:  [id number]  DATE OF BIRTH:  28-Sep-1955  SUBJECTIVE:   Patient here due to bilateral foot ulcers status post debridement, postoperative day #1. Having some bleeding in the right foot through the dressing after going to the bathroom. Denies any other complaints presently.  REVIEW OF SYSTEMS:    Review of Systems  Constitutional: Negative for chills and fever.  HENT: Negative for congestion and tinnitus.   Eyes: Negative for blurred vision and double vision.  Respiratory: Negative for cough, shortness of breath and wheezing.   Cardiovascular: Negative for chest pain, orthopnea and PND.  Gastrointestinal: Negative for abdominal pain, diarrhea, nausea and vomiting.  Genitourinary: Negative for dysuria and hematuria.  Neurological: Negative for dizziness, sensory change and focal weakness.  All other systems reviewed and are negative.   Nutrition: Carb modified.  Tolerating Diet: Yes Tolerating PT: Await Eval.   DRUG ALLERGIES:  No Known Allergies  VITALS:  Blood pressure (!) 145/86, pulse 93, temperature 98.3 F (36.8 C), temperature source Oral, resp. rate 16, height 5\' 11"  (1.803 m), weight 112 kg (246 lb 14.6 oz), SpO2 100 %.  PHYSICAL EXAMINATION:   Physical Exam  GENERAL:  61 y.o.-year-old patient lying in bed in no acute distress.  EYES: Pupils equal, round, reactive to light and accommodation. No scleral icterus. Extraocular muscles intact.  HEENT: Head atraumatic, normocephalic. Oropharynx and nasopharynx clear.  NECK:  Supple, no jugular venous distention. No thyroid enlargement, no tenderness.  LUNGS: Normal breath sounds bilaterally, no wheezing, rales, rhonchi. No use of accessory muscles of respiration.  CARDIOVASCULAR: S1, S2 normal. No murmurs, rubs, or gallops.  ABDOMEN: Soft, nontender, nondistended. Bowel sounds present. No organomegaly or mass.  EXTREMITIES: No  cyanosis, clubbing or edema b/l. B/l foot dressings in place due to surgery yesterday. Right foot dressing with some blood noted on it.     NEUROLOGIC: Cranial nerves II through XII are intact. No focal Motor or sensory deficits b/l.   PSYCHIATRIC: The patient is alert and oriented x 3.  SKIN: No obvious rash, lesion, or ulcer.    LABORATORY PANEL:   CBC  Recent Labs Lab 11/01/16 0530  WBC 6.4  HGB 10.1*  HCT 30.4*  PLT 296   ------------------------------------------------------------------------------------------------------------------  Chemistries   Recent Labs Lab 10/31/16 1335 11/01/16 0530  NA 137 136  K 3.7 4.0  CL 106 106  CO2 24 26  GLUCOSE 141* 230*  BUN 18 16  CREATININE 0.99 0.87  CALCIUM 9.2 8.9  MG 1.7  --   AST 14*  --   ALT 11*  --   ALKPHOS 41  --   BILITOT 0.5  --    ------------------------------------------------------------------------------------------------------------------  Cardiac Enzymes No results for input(s): TROPONINI in the last 168 hours. ------------------------------------------------------------------------------------------------------------------  RADIOLOGY:  No results found.   ASSESSMENT AND PLAN:   61 year old male with past medical history of diabetes, chronic anemia, hyperlipidemia presented to the hospital due to bilateral diabetic foot ulcers.   1. Bilateral diabetic foot ulcers-seen by podiatry and status post incision and drainage. Postop day 1 today. -Clinically afebrile and without stable. White cell count stable. Appreciate podiatry input and continue empiric Zosyn for now. Continue local wound care as per podiatry and weightbearing as per podiatry.  2. Diabetes type 2 without complication-continue Lantus, NovoLog with meals and sliding scale insulin. Blood sugar stable.  3. Chronic anemia-continue iron supplements.  4. Hyperlipidemia-continue fenofibrate.  All the records are reviewed and case  discussed with Care Management/Social Worker. Management plans discussed with the patient, family and they are in agreement.  CODE STATUS: Full  DVT Prophylaxis: Lovenox  TOTAL TIME TAKING CARE OF THIS PATIENT: 30 minutes.   POSSIBLE D/C IN 1-2 DAYS, DEPENDING ON CLINICAL CONDITION.   Henreitta Leber M.D on 11/01/2016 at 1:44 PM  Between 7am to 6pm - Pager - [phone]  After 6pm go to [email or web address] - Proofreader  [facility]  Office  [phone]  CC: Primary care physician; Patient, No Pcp Per

## 2016-11-01 NOTE — Evaluation (Signed)
Physical Therapy Evaluation Patient Details Name: Kyle Banks MRN: [id number] DOB: [birth year] Today's Date: 11/01/2016   History of Present Illness  Kyle Banks is a 61 y.o. male with a known history of diabetes and osteomyelitis. He was sent from [facility] to the hospital for direct admission by Dr. Cleda Mccreedy. 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 He saw Dr. Cleda Mccreedy today as outpatient today with exposed bone and radiographic evidence of osteomyelitis. Dr. Cleda Mccreedy sent patient to the hospital for debridement of infected bone on the right foot as well as left foot. Pt has now undergone irrigation and debridement of both feet. He is POD#1 at time of PT evaluation.   Clinical Impression  Pt admitted with above diagnosis. Pt currently with functional limitations due to the deficits listed below (see PT Problem List).  Pt demonstrates significantly improved strength and mobility compared to last admission. He is modified independent for bed mobility and CGA for transfers and ambulation from bed to bathroom and back. He ambulates with a rolling walker while attempting to WB through heels only and offload with rolling walker. Pt takes short steps but is relatively steady with UE support on walker. Limited ambulation distance at the recommendation of MD. Following ambulation pt has bled through his gauze on his RLE. RN notified. No DOE with ambulation. Pt will need SNF placement at discharge. He has to continue wearing post-op shoes to protect surgical sites and it is unclear how well he can follow guidelines to limit weightbearing and use walker. He is requiring frequent dressing changes as well. He lives alone and would benefit from returning to SNF in order to ensure safe return to prior level of function at home.   Follow Up Recommendations SNF    Equipment Recommendations  Rolling walker with 5" wheels    Recommendations for Other  Services       Precautions / Restrictions Precautions Precautions: Fall Restrictions Weight Bearing Restrictions: Yes RLE Weight Bearing: Weight bearing as tolerated RLE Partial Weight Bearing Percentage or Pounds: Heel only with walker, regular post-op shoe on RLE LLE Weight Bearing: Weight bearing as tolerated Other Position/Activity Restrictions: Heel only with walker. wedge shoe on LLE.      Mobility  Bed Mobility Overal bed mobility: Modified Independent Bed Mobility: Supine to Sit     Supine to sit: Modified independent (Device/Increase time) Sit to supine: Modified independent (Device/Increase time)   General bed mobility comments: Pt demonstrates good speed/sequencing with bed mobility. HOB only minimally elevated and no use of bed rails  Transfers Overall transfer level: Needs assistance Equipment used: Rolling walker (2 wheeled) Transfers: Sit to/from Stand Sit to Stand: Min guard         General transfer comment: Pt able to transfer to/from bed and BSC. Post op and heel wedge shoes donned for all transfers. Pt is steady with use of rolling walker in standing. Good speed demonstrates and safe hand placement  Ambulation/Gait Ambulation/Gait assistance: Min guard Ambulation Distance (Feet): 30 Feet Assistive device: Rolling walker (2 wheeled) Gait Pattern/deviations: Trunk flexed;Decreased step length - right;Decreased step length - left   Gait velocity interpretation: Below normal speed for age/gender General Gait Details: Pt ambulates to bathroom and back to bed with rolling walker and attempting to WB through heels only and offload with rolling walker. Pt takes short steps but is relatively steady with UE support on walker. Limited ambulation distance at the recommendation of MD. Following ambulation pt has  bled through his gauze on his RLE. RN notified. No DOE with ambulation  Stairs            Wheelchair Mobility    Modified Rankin (Stroke Patients  Only)       Balance Overall balance assessment: Needs assistance Sitting-balance support: Bilateral upper extremity supported Sitting balance-Leahy Scale: Good     Standing balance support: No upper extremity supported Standing balance-Leahy Scale: Fair Standing balance comment: Pt requires bilateral UE support in standing to stabilize                             Pertinent Vitals/Pain Pain Assessment: 0-10 Pain Score: 3  Pain Location: Both feet Pain Descriptors / Indicators: Operative site guarding Pain Intervention(s): Monitored during session    Home Living Family/patient expects to be discharged to:: Private residence Living Arrangements: Alone Available Help at Discharge: Friend(s) Type of Home: Other(Comment) (Duplex) Home Access: Ramped entrance     Home Layout: One level Home Equipment: None      Prior Function Level of Independence: Independent         Comments: Prior to last admission pt reports independence with ADLs/IADLs. Drives. No falls in the last 12 months. Pt states that since his admission to [facility] he has been using a rolling walker and wheelchair for mobility needs     Hand Dominance   Dominant Hand: Right    Extremity/Trunk Assessment   Upper Extremity Assessment Upper Extremity Assessment: Overall WFL for tasks assessed    Lower Extremity Assessment Lower Extremity Assessment: Overall WFL for tasks assessed;RLE deficits/detail RLE Deficits / Details: Ankle strength not assessed due to recent surgery with significant dressings       Communication   Communication: No difficulties  Cognition Arousal/Alertness: Awake/alert Behavior During Therapy: WFL for tasks assessed/performed Overall Cognitive Status: Within Functional Limits for tasks assessed                                 General Comments: AOx4 at time of PT evaluation      General Comments      Exercises     Assessment/Plan     PT Assessment Patient needs continued PT services  PT Problem List Decreased activity tolerance;Decreased balance;Decreased mobility;Decreased safety awareness;Pain       PT Treatment Interventions DME instruction;Gait training;Functional mobility training;Therapeutic activities;Therapeutic exercise;Neuromuscular re-education;Balance training;Stair training;Patient/family education    PT Goals (Current goals can be found in the Care Plan section)  Acute Rehab PT Goals Patient Stated Goal: Return to prior function at home PT Goal Formulation: With patient Time For Goal Achievement: 11/15/16 Potential to Achieve Goals: Good    Frequency 7X/week   Barriers to discharge Decreased caregiver support Pt lives alone    Co-evaluation               AM-PAC PT "6 Clicks" Daily Activity  Outcome Measure Difficulty turning over in bed (including adjusting bedclothes, sheets and blankets)?: None Difficulty moving from lying on back to sitting on the side of the bed? : None Difficulty sitting down on and standing up from a chair with arms (e.g., wheelchair, bedside commode, etc,.)?: A Little Help needed moving to and from a bed to chair (including a wheelchair)?: A Little Help needed walking in hospital room?: A Little Help needed climbing 3-5 steps with a railing? : A Lot 6 Click  Score: 19    End of Session Equipment Utilized During Treatment: Gait belt Activity Tolerance: Patient tolerated treatment well Patient left: in bed;with bed alarm set;Other (comment) (Pillow elevating bilateral LEs) Nurse Communication: Mobility status;Other (comment) (Bleeding through dressings) PT Visit Diagnosis: Unsteadiness on feet (R26.81);Difficulty in walking, not elsewhere classified (R26.2);Pain Pain - Right/Left: Right (right and left) Pain - part of body: Ankle and joints of foot    Time: 1200-1227 PT Time Calculation (min) (ACUTE ONLY): 27 min   Charges:   PT Evaluation $PT Eval Moderate  Complexity: 1 Procedure PT Treatments $Therapeutic Activity: 8-22 mins   PT G Codes:   PT G-Codes **NOT FOR INPATIENT CLASS** Functional Assessment Tool Used: AM-PAC 6 Clicks Basic Mobility Functional Limitation: Mobility: Walking and moving around Mobility: Walking and Moving Around Current Status (O8757): At least 20 percent but less than 40 percent impaired, limited or restricted Mobility: Walking and Moving Around Goal Status [phone]): At least 1 percent but less than 20 percent impaired, limited or restricted    Phillips Grout PT, DPT    Huprich,Jason 11/01/2016, 2:18 PM

## 2016-11-02 LAB — CBC
HEMATOCRIT: 27.8 % — AB (ref 40.0–52.0)
Hemoglobin: 9.2 g/dL — ABNORMAL LOW (ref 13.0–18.0)
MCH: 27 pg (ref 26.0–34.0)
MCHC: 33.1 g/dL (ref 32.0–36.0)
MCV: 81.4 fL (ref 80.0–100.0)
Platelets: 255 10*3/uL (ref 150–440)
RBC: 3.41 MIL/uL — AB (ref 4.40–5.90)
RDW: 15.4 % — ABNORMAL HIGH (ref 11.5–14.5)
WBC: 7 10*3/uL (ref 3.8–10.6)

## 2016-11-02 LAB — GLUCOSE, CAPILLARY
GLUCOSE-CAPILLARY: 90 mg/dL (ref 65–99)
GLUCOSE-CAPILLARY: 129 mg/dL — AB (ref 65–99)
Glucose-Capillary: 97 mg/dL (ref 65–99)
Glucose-Capillary: 100 mg/dL — ABNORMAL HIGH (ref 65–99)

## 2016-11-02 NOTE — Progress Notes (Signed)
[place] at [place] NAME: Kyle Banks    MR#:  [id number]  DATE OF BIRTH:  May 16, 1955  SUBJECTIVE:   Patient here due to bilateral foot ulcers status post debridement, postoperative day #1. Having some bleeding in the right foot through the dressing after going to the bathroom. Denies any other complaints presently.  REVIEW OF SYSTEMS:    Review of Systems  Constitutional: Negative for chills and fever.  HENT: Negative for congestion and tinnitus.   Eyes: Negative for blurred vision and double vision.  Respiratory: Negative for cough, shortness of breath and wheezing.   Cardiovascular: Negative for chest pain, orthopnea and PND.  Gastrointestinal: Negative for abdominal pain, diarrhea, nausea and vomiting.  Genitourinary: Negative for dysuria and hematuria.  Neurological: Negative for dizziness, sensory change and focal weakness.  All other systems reviewed and are negative.   Nutrition: Carb modified.  Tolerating Diet: Yes Tolerating PT: Await Eval.   DRUG ALLERGIES:  No Known Allergies  VITALS:  Blood pressure 116/63, pulse 80, temperature 98 F (36.7 C), temperature source Oral, resp. rate 18, height 5\' 11"  (1.803 m), weight 112 kg (246 lb 14.6 oz), SpO2 97 %.  PHYSICAL EXAMINATION:   Physical Exam  GENERAL:  61 y.o.-year-old patient lying in bed in no acute distress.  EYES: Pupils equal, round, reactive to light and accommodation. No scleral icterus. Extraocular muscles intact.  HEENT: Head atraumatic, normocephalic. Oropharynx and nasopharynx clear.  NECK:  Supple, no jugular venous distention. No thyroid enlargement, no tenderness.  LUNGS: Normal breath sounds bilaterally, no wheezing, rales, rhonchi. No use of accessory muscles of respiration.  CARDIOVASCULAR: S1, S2 normal. No murmurs, rubs, or gallops.  ABDOMEN: Soft, nontender, nondistended. Bowel sounds present. No organomegaly or mass.  EXTREMITIES: No cyanosis,  clubbing or edema b/l. B/l foot dressings in place due to surgery yesterday. Right foot dressing with some blood noted on it.     NEUROLOGIC: Cranial nerves II through XII are intact. No focal Motor or sensory deficits b/l.   PSYCHIATRIC: The patient is alert and oriented x 3.  SKIN: No obvious rash, lesion, or ulcer.    LABORATORY PANEL:   CBC  Recent Labs Lab 11/02/16 0550  WBC 7.0  HGB 9.2*  HCT 27.8*  PLT 255   ------------------------------------------------------------------------------------------------------------------  Chemistries   Recent Labs Lab 10/31/16 1335 11/01/16 0530  NA 137 136  K 3.7 4.0  CL 106 106  CO2 24 26  GLUCOSE 141* 230*  BUN 18 16  CREATININE 0.99 0.87  CALCIUM 9.2 8.9  MG 1.7  --   AST 14*  --   ALT 11*  --   ALKPHOS 41  --   BILITOT 0.5  --    ------------------------------------------------------------------------------------------------------------------  Cardiac Enzymes No results for input(s): TROPONINI in the last 168 hours. ------------------------------------------------------------------------------------------------------------------  RADIOLOGY:  No results found.   ASSESSMENT AND PLAN:   61 year old male with past medical history of diabetes, chronic anemia, hyperlipidemia presented to the hospital due to bilateral diabetic foot ulcers.  1. Bilateral diabetic foot ulcers-seen by podiatry and status post incision and drainage. Postop day 2 today. -Clinically afebrile and without stable. White cell count stable. Appreciate podiatry input and continue empiric Zosyn for now. Continue local wound care as per podiatry and weightbearing as per podiatry. - await Podiatry input regarding longevity of IV abx treatment.   2. Diabetes type 2 without complication-continue Lantus, NovoLog with meals and sliding scale insulin. Blood sugar stable.  3. Chronic anemia-continue iron supplements.  4. Hyperlipidemia-continue  fenofibrate.  Appreciate PT eval and likely discharge to SNF but await insurance authorization.   All the records are reviewed and case discussed with Care Management/Social Worker. Management plans discussed with the patient, family and they are in agreement.  CODE STATUS: Full  DVT Prophylaxis: Lovenox  TOTAL TIME TAKING CARE OF THIS PATIENT: 25 minutes.   POSSIBLE D/C IN 1-2 DAYS, DEPENDING ON CLINICAL CONDITION.   Henreitta Leber M.D on 11/02/2016 at 1:53 PM  Between 7am to 6pm - Pager - [phone]  After 6pm go to [email or web address] - Proofreader  [facility]  Office  [phone]  CC: Primary care physician; Patient, No Pcp Per

## 2016-11-02 NOTE — Progress Notes (Signed)
FBS 129

## 2016-11-02 NOTE — Progress Notes (Signed)
Pharmacy Antibiotic Note  Kyle Banks is a 61 y.o. male admitted on 10/31/2016 with left foot infection.  Pharmacy has been consulted for Zosyn dosing.  Plan: Day 3-  Zosyn 3.375g IV q8h (4 hour infusion).    Height: 5\' 11"  (180.3 cm) Weight: 246 lb 14.6 oz (112 kg) IBW/kg (Calculated) : 75.3  Temp (24hrs), Avg:97.9 F (36.6 C), Min:97.6 F (36.4 C), Max:98.5 F (36.9 C)   Recent Labs Lab 10/31/16 1335 11/01/16 0530 11/02/16 0550  WBC 5.5 6.4 7.0  CREATININE 0.99 0.87  --     Estimated Creatinine Clearance: 113.5 mL/min (by C-G formula based on SCr of 0.87 mg/dL).    No Known Allergies  Antimicrobials this admission: Zosyn 7/18 >>  Dose adjustments this admission:   Microbiology results:   Thank you for allowing pharmacy to be a part of this patient's care.  Shaquile Lutze A 11/02/2016 1:25 PM

## 2016-11-02 NOTE — Progress Notes (Signed)
2 Days Post-Op    Subjective/Chief Complaint: Patient seen. Overall states he is feeling good.   Objective: Vital signs in last 24 hours: Temp:  [97.6 F (36.4 C)-98.5 F (36.9 C)] 98 F (36.7 C) (07/20 0900) Pulse Rate:  [80-93] 80 (07/20 0900) Resp:  [16-18] 18 (07/20 0900) BP: (111-168)/(60-86) 116/63 (07/20 0900) SpO2:  [96 %-100 %] 97 % (07/20 0900) Last BM Date: 11/01/16  Intake/Output from previous day: 07/19 0701 - 07/20 0700 In: 530 [P.O.:480; IV Piggyback:50] Out: -  Intake/Output this shift: Total I/O In: 240 [P.O.:240] Out: 200 [Urine:200]  Some mild to moderate bleeding on the bandages on both feet. Minimal drainage on the left. No gross purulence. Incision on the right foot is well coapted.  Lab Results:   Recent Labs  11/01/16 0530 11/02/16 0550  WBC 6.4 7.0  HGB 10.1* 9.2*  HCT 30.4* 27.8*  PLT 296 255   BMET  Recent Labs  10/31/16 1335 11/01/16 0530  NA 137 136  K 3.7 4.0  CL 106 106  CO2 24 26  GLUCOSE 141* 230*  BUN 18 16  CREATININE 0.99 0.87  CALCIUM 9.2 8.9   PT/INR No results for input(s): LABPROT, INR in the last 72 hours. ABG No results for input(s): PHART, HCO3 in the last 72 hours.  Invalid input(s): PCO2, PO2  Studies/Results: No results found.  Anti-infectives: Anti-infectives    Start     Dose/Rate Route Frequency Ordered Stop   10/31/16 1730  piperacillin-tazobactam (ZOSYN) IVPB 3.375 g     3.375 g 12.5 mL/hr over 240 Minutes Intravenous Every 8 hours 10/31/16 1638        Assessment/Plan: s/p Procedure(s): IRRIGATION AND DEBRIDEMENT FOOT (Bilateral) Assessment: Stable status post debridement bilateral feet.   Plan: Sterile dressing changes performed to both feet. Discussed with the patient that at this point he is stable to return back to [facility]. Dressing on the right foot may stay intact. Dressing change with dry sterile gauze every 2-3 days on the left. Follow-up outpatient in a couple of weeks  LOS: 2 days    Durward Fortes 11/02/2016

## 2016-11-02 NOTE — Progress Notes (Signed)
Physical Therapy Treatment Patient Details Name: Kyle Banks MRN: [id number] DOB: 26-Jan-1956 Today's Date: 11/02/2016    History of Present Illness Kyle Banks is a 61 y.o. male with a known history of diabetes and osteomyelitis. He was sent from [facility] to the hospital for direct admission by Dr. Cleda Mccreedy. 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 He saw Dr. Cleda Mccreedy today as outpatient today with exposed bone and radiographic evidence of osteomyelitis. Dr. Cleda Mccreedy sent patient to the hospital for debridement of infected bone on the right foot as well as left foot. Pt has now undergone irrigation and debridement of both feet. He is POD#1 at time of PT evaluation.     PT Comments    Pt tolerated this session well. Pt states Dr. Cleda Mccreedy doesn't want him to [facility] on BLE's unless absolutely necessary due to R foot bleeding post-ambulating to/from bathroom yesterday (11/01/16). Pt did not need to use restroom this AM, so transfers and ambulation out of bed deferred this session per Dr. Sammuel Bailiff orders. Performed there-ex with supervision at bed level. Pt educated on pressure relief techniques to prevent bed sore development - rolling side-to-side and performing press ups with bed flat. Will continue to progress.    Follow Up Recommendations  SNF     Equipment Recommendations  Rolling walker with 5" wheels    Recommendations for Other Services       Precautions / Restrictions Precautions Precautions: Fall Restrictions Weight Bearing Restrictions: Yes RLE Weight Bearing: Weight bearing as tolerated RLE Partial Weight Bearing Percentage or Pounds: Heel only with walker, regular post-op shoe on RLE LLE Weight Bearing: Weight bearing as tolerated Other Position/Activity Restrictions: Heel only with walker. Wedge shoe on LLE.    Mobility  Bed Mobility Overal bed mobility: Modified Independent Bed Mobility: Supine to Sit;Sit to  Supine     Supine to sit: Modified independent (Device/Increase time) Sit to supine: Modified independent (Device/Increase time)   General bed mobility comments: Good technique with no dizziness noted once EOB. No use of handrails. Mod I due to increased time.  Transfers                 General transfer comment: Deferred due to MD's order to remain NWB in both feet unless necessary - Pt clear to ambulate to/from bathroom with WB restrictions listed above when needed.  Ambulation/Gait             General Gait Details: Deferred due to MD's order to remain NWB in both feet unless necessary - Pt clear to ambulate to/from bathroom with WB restrictions listed above when needed.   Stairs            Wheelchair Mobility    Modified Rankin (Stroke Patients Only)       Balance       Sitting balance - Comments: Sat EOB for 52min with no UE/LE support. Good seated balance.                                    Cognition Arousal/Alertness: Awake/alert Behavior During Therapy: WFL for tasks assessed/performed Overall Cognitive Status: Within Functional Limits for tasks assessed                                        Exercises  Other Exercises Other Exercises: Supine ther-ex x15 B included: SLR's, hip abd, knee to chest, and tricep push up with bed flat and LE's straight. Performed with supervision. Seated ther-ex x15 B included sit ups and LAQ's. Pt mildly dizzy after sit ups, but this passed within a couple minutes sitting EOB. Pt educated on pressure relieving techniques while in bed - rolling side to side, press ups, etc. - to reduce likelihood of bed sore development.    General Comments        Pertinent Vitals/Pain Pain Assessment: No/denies pain Pain Location: Both feet Pain Intervention(s): Limited activity within patient's tolerance;Monitored during session;Repositioned    Home Living                      Prior  Function            PT Goals (current goals can now be found in the care plan section) Acute Rehab PT Goals Patient Stated Goal: Return to prior function at home PT Goal Formulation: With patient Time For Goal Achievement: 11/15/16 Potential to Achieve Goals: Good Progress towards PT goals: Progressing toward goals    Frequency    7X/week      PT Plan Current plan remains appropriate    Co-evaluation              AM-PAC PT "6 Clicks" Daily Activity  Outcome Measure  Difficulty turning over in bed (including adjusting bedclothes, sheets and blankets)?: None Difficulty moving from lying on back to sitting on the side of the bed? : None Difficulty sitting down on and standing up from a chair with arms (e.g., wheelchair, bedside commode, etc,.)?: A Little Help needed moving to and from a bed to chair (including a wheelchair)?: A Little Help needed walking in hospital room?: A Little Help needed climbing 3-5 steps with a railing? : A Lot 6 Click Score: 19    End of Session   Activity Tolerance: Patient tolerated treatment well Patient left: in bed;with call bell/phone within reach;with bed alarm set Nurse Communication: Mobility status;Other (comment) PT Visit Diagnosis: Unsteadiness on feet (R26.81);Difficulty in walking, not elsewhere classified (R26.2);Pain Pain - Right/Left:  (bilaterally) Pain - part of body: Ankle and joints of foot     Time: 9562-1308 PT Time Calculation (min) (ACUTE ONLY): 24 min  Charges:                       G Codes:  Functional Assessment Tool Used: AM-PAC 6 Clicks Basic Mobility    Donaciano Eva, PT, SPT  Mykelle Cockerell 11/02/2016, 9:10 AM

## 2016-11-02 NOTE — Progress Notes (Signed)
Clinical Social Worker (CSW) received a call from patient's daughter Lucia Mccreadie [phone] asking about [place] authorization. CSW contacted daughter back and left her a voicemail making her aware that [place] authorization is still pending. CSW will continue to follow and assist as needed.   [employer], LCSW [phone]

## 2016-11-03 LAB — GLUCOSE, CAPILLARY
GLUCOSE-CAPILLARY: 94 mg/dL (ref 65–99)
GLUCOSE-CAPILLARY: 163 mg/dL — AB (ref 65–99)
Glucose-Capillary: 79 mg/dL (ref 65–99)
Glucose-Capillary: 152 mg/dL — ABNORMAL HIGH (ref 65–99)

## 2016-11-03 NOTE — Progress Notes (Signed)
Pt alert and oriented. Medicated for pain with good results. Up to bathroom with assistance. Surgical dressing remaining dry and intact.

## 2016-11-03 NOTE — Progress Notes (Signed)
[facility] [place] NAME: Kyle Banks    MR#:  [id number]  DATE OF BIRTH:  09/30/1955  SUBJECTIVE:   Patient here due to bilateral foot ulcers status post debridement, postoperative day #2. No acute events overnight. Awaiting Insurance authorization for placemen to SNF.  No complaints presently.   REVIEW OF SYSTEMS:    Review of Systems  Constitutional: Negative for chills and fever.  HENT: Negative for congestion and tinnitus.   Eyes: Negative for blurred vision and double vision.  Respiratory: Negative for cough, shortness of breath and wheezing.   Cardiovascular: Negative for chest pain, orthopnea and PND.  Gastrointestinal: Negative for abdominal pain, diarrhea, nausea and vomiting.  Genitourinary: Negative for dysuria and hematuria.  Neurological: Negative for dizziness, sensory change and focal weakness.  All other systems reviewed and are negative.   Nutrition: Carb modified.  Tolerating Diet: Yes Tolerating PT: Await Eval.   DRUG ALLERGIES:  No Known Allergies  VITALS:  Blood pressure (!) 153/80, pulse 84, temperature 98.1 F (36.7 C), temperature source Oral, resp. rate 18, height 5\' 11"  (1.803 m), weight 112 kg (246 lb 14.6 oz), SpO2 100 %.  PHYSICAL EXAMINATION:   Physical Exam  GENERAL:  61 y.o.-year-old patient lying in bed in no acute distress.  EYES: Pupils equal, round, reactive to light and accommodation. No scleral icterus. Extraocular muscles intact.  HEENT: Head atraumatic, normocephalic. Oropharynx and nasopharynx clear.  NECK:  Supple, no jugular venous distention. No thyroid enlargement, no tenderness.  LUNGS: Normal breath sounds bilaterally, no wheezing, rales, rhonchi. No use of accessory muscles of respiration.  CARDIOVASCULAR: S1, S2 normal. No murmurs, rubs, or gallops.  ABDOMEN: Soft, nontender, nondistended. Bowel sounds present. No organomegaly or mass.  EXTREMITIES: No cyanosis, clubbing or  edema b/l. B/l foot dressings in place due to surgery. NEUROLOGIC: Cranial nerves II through XII are intact. No focal Motor or sensory deficits b/l.   PSYCHIATRIC: The patient is alert and oriented x 3.  SKIN: No obvious rash, lesion, or ulcer.    LABORATORY PANEL:   CBC  Recent Labs Lab 11/02/16 0550  WBC 7.0  HGB 9.2*  HCT 27.8*  PLT 255   ------------------------------------------------------------------------------------------------------------------  Chemistries   Recent Labs Lab 10/31/16 1335 11/01/16 0530  NA 137 136  K 3.7 4.0  CL 106 106  CO2 24 26  GLUCOSE 141* 230*  BUN 18 16  CREATININE 0.99 0.87  CALCIUM 9.2 8.9  MG 1.7  --   AST 14*  --   ALT 11*  --   ALKPHOS 41  --   BILITOT 0.5  --    ------------------------------------------------------------------------------------------------------------------  Cardiac Enzymes No results for input(s): TROPONINI in the last 168 hours. ------------------------------------------------------------------------------------------------------------------  RADIOLOGY:  No results found.   ASSESSMENT AND PLAN:   61 year old male with past medical history of diabetes, chronic anemia, hyperlipidemia presented to the hospital due to bilateral diabetic foot ulcers.  1. Bilateral diabetic foot ulcers-seen by podiatry and status post incision and drainage. Postop day # 3 today. -Clinically afebrile and without stable. White cell count stable. Appreciate podiatry input and continue empiric Zosyn for now. -Patient is being followed by infectious disease and continue Zosyn until the end of the month.  Continue local wound care as per podiatry and weightbearing as per podiatry.  2. Diabetes type 2 without complication-continue Lantus, NovoLog with meals and sliding scale insulin. Blood sugar stable.  3. Chronic anemia-continue iron supplements.  4. Hyperlipidemia-continue fenofibrate.  Appreciate PT eval and likely  discharge to SNF but await insurance authorization.   All the records are reviewed and case discussed with Care Management/Social Worker. Management plans discussed with the patient, family and they are in agreement.  CODE STATUS: Full  DVT Prophylaxis: Lovenox  TOTAL TIME TAKING CARE OF THIS PATIENT: 25 minutes.   POSSIBLE D/C IN 1-2 DAYS, DEPENDING ON CLINICAL CONDITION.   Henreitta Leber M.D on 11/03/2016 at 1:44 PM  Between 7am to 6pm - Pager - [phone]  After 6pm go to [email or web address] - Proofreader  [facility]  Office  [phone]  CC: Primary care physician; Patient, No Pcp Per

## 2016-11-03 NOTE — Progress Notes (Signed)
Patient a&o, vss. Pain meds given with relief. Dressings dry and intact on bilateral feet. Resting in bed. Continue to monitor.

## 2016-11-03 NOTE — Progress Notes (Signed)
Physical Therapy Treatment Patient Details Name: Kyle Banks MRN: [id number] DOB: 11-22-1955 Today's Date: 11/03/2016    History of Present Illness Kyle Banks is a 61 y.o. male with a known history of diabetes and osteomyelitis. He was sent from [facility] to the hospital for direct admission by Dr. Cleda Mccreedy. 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 He saw Dr. Cleda Mccreedy today as outpatient today with exposed bone and radiographic evidence of osteomyelitis. Dr. Cleda Mccreedy sent patient to the hospital for debridement of infected bone on the right foot as well as left foot. Pt has now undergone irrigation and debridement of both feet. He is POD#1 at time of PT evaluation.     PT Comments    Pt just returned from bathroom with nursing.  Reminded pt to use walker at all times to comply with WB restrictions and post-op shoe.  Participated in exercises as described below.  Reviewed HEP with pt and encouraged to do 3x per day to maintain LE strength.   Follow Up Recommendations  SNF     Equipment Recommendations  Rolling walker with 5" wheels    Recommendations for Other Services       Precautions / Restrictions Precautions Precautions: Fall Restrictions Weight Bearing Restrictions: Yes RLE Weight Bearing: Weight bearing as tolerated RLE Partial Weight Bearing Percentage or Pounds: Heel only with walker, regular post-op shoe on RLE LLE Weight Bearing: Weight bearing as tolerated Other Position/Activity Restrictions: Heel only with walker. Wedge shoe on LLE.    Mobility  Bed Mobility                  Transfers                    Ambulation/Gait                 Stairs            Wheelchair Mobility    Modified Rankin (Stroke Patients Only)       Balance                                            Cognition Arousal/Alertness: Awake/alert Behavior During Therapy:  WFL for tasks assessed/performed Overall Cognitive Status: Within Functional Limits for tasks assessed                                        Exercises Other Exercises Other Exercises: Supine exercised BLE 2 x 10 for quad stets, heel slides, SLR, AB/adduction, ankle pumps    General Comments        Pertinent Vitals/Pain Pain Assessment: 0-10 Pain Score: 3  Pain Location: Both feet Pain Descriptors / Indicators: Operative site guarding;Sore Pain Intervention(s): Limited activity within patient's tolerance    Home Living                      Prior Function            PT Goals (current goals can now be found in the care plan section) Progress towards PT goals: Progressing toward goals    Frequency    7X/week      PT Plan Current plan remains appropriate    Co-evaluation  AM-PAC PT "6 Clicks" Daily Activity  Outcome Measure  Difficulty turning over in bed (including adjusting bedclothes, sheets and blankets)?: None Difficulty moving from lying on back to sitting on the side of the bed? : None Difficulty sitting down on and standing up from a chair with arms (e.g., wheelchair, bedside commode, etc,.)?: A Little Help needed moving to and from a bed to chair (including a wheelchair)?: A Little Help needed walking in hospital room?: A Little Help needed climbing 3-5 steps with a railing? : A Lot 6 Click Score: 19    End of Session   Activity Tolerance: Patient tolerated treatment well Patient left: in bed;with bed alarm set;with call bell/phone within reach   Pain - part of body: Ankle and joints of foot     Time: 6579-0383 PT Time Calculation (min) (ACUTE ONLY): 11 min  Charges:  $Therapeutic Exercise: 8-22 mins                    G Codes:       Chesley Noon, PTA 11/03/16, 10:00 AM

## 2016-11-04 LAB — GLUCOSE, CAPILLARY
GLUCOSE-CAPILLARY: 143 mg/dL — AB (ref 65–99)
Glucose-Capillary: 145 mg/dL — ABNORMAL HIGH (ref 65–99)
Glucose-Capillary: 111 mg/dL — ABNORMAL HIGH (ref 65–99)

## 2016-11-04 NOTE — Progress Notes (Signed)
4 Days Post-Op   Subjective/Chief Complaint: Patient seen. No complaints of pain. Waiting on his insurance for placement to skilled nursing.   Objective: Vital signs in last 24 hours: Temp:  [98.2 F (36.8 C)-98.6 F (37 C)] 98.2 F (36.8 C) (07/22 0718) Pulse Rate:  [85-91] 85 (07/22 0718) Resp:  [18] 18 (07/22 0718) BP: (123-145)/(67-87) 145/86 (07/22 0718) SpO2:  [97 %-99 %] 99 % (07/22 0718) Last BM Date: 11/02/16  Intake/Output from previous day: 07/21 0701 - 07/22 0700 In: 600 [P.O.:600] Out: 905 [Urine:905] Intake/Output this shift: Total I/O In: 540 [P.O.:240; IV Piggyback:300] Out: -   Moderate bleeding on the bandage on the right foot. Upon removal the incision is still well coapted. No signs of purulence. moderate drainage on the bandage on the left. Upon removal the Mepitel and staples are intact. Overall the wound appears stable with no gross infection.  Lab Results:   Recent Labs  11/02/16 0550  WBC 7.0  HGB 9.2*  HCT 27.8*  PLT 255   BMET No results for input(s): NA, K, CL, CO2, GLUCOSE, BUN, CREATININE, CALCIUM in the last 72 hours. PT/INR No results for input(s): LABPROT, INR in the last 72 hours. ABG No results for input(s): PHART, HCO3 in the last 72 hours.  Invalid input(s): PCO2, PO2  Studies/Results: No results found.  Anti-infectives: Anti-infectives    Start     Dose/Rate Route Frequency Ordered Stop   10/31/16 1730  piperacillin-tazobactam (ZOSYN) IVPB 3.375 g     3.375 g 12.5 mL/hr over 240 Minutes Intravenous Every 8 hours 10/31/16 1638        Assessment/Plan: s/p Procedure(s): IRRIGATION AND DEBRIDEMENT FOOT (Bilateral) Assessment: Stable status post bilateral foot surgery   Plan: Sterile dressings reapplied to both feet. Patient is stable for transfer to skilled nursing 1 approved by insurance. Discussed with the patient changing the bandage on the left foot 3 times a week and right foot once to twice per week depending on  bleeding. We will keep his follow-up outpatient in a couple of weeks for reevaluation  LOS: 4 days    Durward Fortes 11/04/2016

## 2016-11-04 NOTE — Progress Notes (Signed)
Physical Therapy Treatment Patient Details Name: Kyle Banks MRN: [id number] DOB: [birth year] Today's Date: 11/04/2016    History of Present Illness Kyle Banks is a 61 y.o. male with a known history of diabetes and osteomyelitis. He was sent from [facility] to the hospital for direct admission by Dr. Cleda Mccreedy. 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 He saw Dr. Cleda Mccreedy today as outpatient today with exposed bone and radiographic evidence of osteomyelitis. Dr. Cleda Mccreedy sent patient to the hospital for debridement of infected bone on the right foot as well as left foot. Pt has now undergone irrigation and debridement of both feet. He is POD#1 at time of PT evaluation.     PT Comments    Pt in bed with feet elevated.  Has ambulated with nursing this am for bathing and bathroom.  Reports he used walker and post-op shoes.  Participated in exercises as described below.  Encouraged out of bed to recliner but he stated he felt as if he was able to elevate his legs better in bed and preferred to stay in bed at this time.   Follow Up Recommendations  SNF     Equipment Recommendations  Rolling walker with 5" wheels    Recommendations for Other Services       Precautions / Restrictions Precautions Precautions: Fall Restrictions Weight Bearing Restrictions: Yes RLE Weight Bearing: Weight bearing as tolerated RLE Partial Weight Bearing Percentage or Pounds: Heel only with walker, regular post-op shoe on RLE LLE Weight Bearing: Weight bearing as tolerated Other Position/Activity Restrictions: Heel only with walker. Wedge shoe on LLE.    Mobility  Bed Mobility                  Transfers                    Ambulation/Gait                 Stairs            Wheelchair Mobility    Modified Rankin (Stroke Patients Only)       Balance                                             Cognition Arousal/Alertness: Awake/alert Behavior During Therapy: WFL for tasks assessed/performed Overall Cognitive Status: Within Functional Limits for tasks assessed                                        Exercises Other Exercises Other Exercises: Supine exercised BLE 3 x 10 for quad stets, heel slides, SLR, AB/adduction, ankle pumps    General Comments        Pertinent Vitals/Pain Pain Assessment: 0-10 Pain Score: 2  Pain Location: Both feet Pain Descriptors / Indicators: Operative site guarding;Sore Pain Intervention(s): Limited activity within patient's tolerance    Home Living                      Prior Function            PT Goals (current goals can now be found in the care plan section) Progress towards PT goals: Progressing toward goals    Frequency    7X/week  PT Plan Current plan remains appropriate    Co-evaluation              AM-PAC PT "6 Clicks" Daily Activity  Outcome Measure  Difficulty turning over in bed (including adjusting bedclothes, sheets and blankets)?: None Difficulty moving from lying on back to sitting on the side of the bed? : None Difficulty sitting down on and standing up from a chair with arms (e.g., wheelchair, bedside commode, etc,.)?: A Little Help needed moving to and from a bed to chair (including a wheelchair)?: A Little Help needed walking in hospital room?: A Little Help needed climbing 3-5 steps with a railing? : A Lot 6 Click Score: 19    End of Session   Activity Tolerance: Patient tolerated treatment well Patient left: in bed;with bed alarm set;with call bell/phone within reach   Pain - part of body: Ankle and joints of foot     Time: 1031-2811 PT Time Calculation (min) (ACUTE ONLY): 15 min  Charges:  $Therapeutic Exercise: 8-22 mins                    G Codes:       Chesley Noon, PTA 11/04/16, 10:34 AM

## 2016-11-04 NOTE — Progress Notes (Signed)
[facility] [place] NAME: Kyle Banks    MR#:  [id number]  DATE OF BIRTH:  09/05/1955  SUBJECTIVE:   Patient here due to bilateral foot ulcers status post debridement, postoperative day #4. No acute events overnight. Awaiting Insurance authorization for placemen to SNF.  Dressings changed by Podiatry today.     REVIEW OF SYSTEMS:    Review of Systems  Constitutional: Negative for chills and fever.  HENT: Negative for congestion and tinnitus.   Eyes: Negative for blurred vision and double vision.  Respiratory: Negative for cough, shortness of breath and wheezing.   Cardiovascular: Negative for chest pain, orthopnea and PND.  Gastrointestinal: Negative for abdominal pain, diarrhea, nausea and vomiting.  Genitourinary: Negative for dysuria and hematuria.  Neurological: Negative for dizziness, sensory change and focal weakness.  All other systems reviewed and are negative.   Nutrition: Carb modified.  Tolerating Diet: Yes Tolerating PT: Await Eval.   DRUG ALLERGIES:  No Known Allergies  VITALS:  Blood pressure (!) 145/86, pulse 85, temperature 98.2 F (36.8 C), temperature source Oral, resp. rate 18, height 5\' 11"  (1.803 m), weight 112 kg (246 lb 14.6 oz), SpO2 99 %.  PHYSICAL EXAMINATION:   Physical Exam  GENERAL:  61 y.o.-year-old patient lying in bed in no acute distress.  EYES: Pupils equal, round, reactive to light and accommodation. No scleral icterus. Extraocular muscles intact.  HEENT: Head atraumatic, normocephalic. Oropharynx and nasopharynx clear.  NECK:  Supple, no jugular venous distention. No thyroid enlargement, no tenderness.  LUNGS: Normal breath sounds bilaterally, no wheezing, rales, rhonchi. No use of accessory muscles of respiration.  CARDIOVASCULAR: S1, S2 normal. No murmurs, rubs, or gallops.  ABDOMEN: Soft, nontender, nondistended. Bowel sounds present. No organomegaly or mass.  EXTREMITIES: No cyanosis,  clubbing or edema b/l. B/l foot dressings in place due to surgery. No drainage or bleeding noted.  NEUROLOGIC: Cranial nerves II through XII are intact. No focal Motor or sensory deficits b/l.   PSYCHIATRIC: The patient is alert and oriented x 3.  SKIN: No obvious rash, lesion, or ulcer.    LABORATORY PANEL:   CBC  Recent Labs Lab 11/02/16 0550  WBC 7.0  HGB 9.2*  HCT 27.8*  PLT 255   ------------------------------------------------------------------------------------------------------------------  Chemistries   Recent Labs Lab 10/31/16 1335 11/01/16 0530  NA 137 136  K 3.7 4.0  CL 106 106  CO2 24 26  GLUCOSE 141* 230*  BUN 18 16  CREATININE 0.99 0.87  CALCIUM 9.2 8.9  MG 1.7  --   AST 14*  --   ALT 11*  --   ALKPHOS 41  --   BILITOT 0.5  --    ------------------------------------------------------------------------------------------------------------------  Cardiac Enzymes No results for input(s): TROPONINI in the last 168 hours. ------------------------------------------------------------------------------------------------------------------  RADIOLOGY:  No results found.   ASSESSMENT AND PLAN:   60 year old male with past medical history of diabetes, chronic anemia, hyperlipidemia presented to the hospital due to bilateral diabetic foot ulcers.  1. Bilateral diabetic foot ulcers-seen by podiatry and status post incision and drainage. Postop day # 4 today. -Clinically afebrile and hemodynamically stable. White cell count stable. Appreciate podiatry input and continue empiric Zosyn for now. -Patient is being followed by infectious disease and continue Zosyn until 11/13/16 - Continue local wound care as per podiatry and dressings changed today.   2. Diabetes type 2 without complication-continue Lantus, NovoLog with meals and sliding scale insulin. Blood sugar stable.  3. Chronic anemia-continue iron  supplements.  4. Hyperlipidemia-continue  fenofibrate.  Appreciate PT eval and likely discharge to SNF but await insurance authorization.   All the records are reviewed and case discussed with Care Management/Social Worker. Management plans discussed with the patient, family and they are in agreement.  CODE STATUS: Full  DVT Prophylaxis: Lovenox  TOTAL TIME TAKING CARE OF THIS PATIENT: 25 minutes.   POSSIBLE D/C IN 1-2 DAYS, DEPENDING ON CLINICAL CONDITION.   Henreitta Leber M.D on 11/04/2016 at 1:10 PM  Between 7am to 6pm - Pager - [phone]  After 6pm go to [email or web address] - Proofreader  [facility]  Office  [phone]  CC: Primary care physician; Patient, No Pcp Per

## 2016-11-05 LAB — BASIC METABOLIC PANEL
ANION GAP: 7 (ref 5–15)
BUN: 12 mg/dL (ref 6–20)
CHLORIDE: 105 mmol/L (ref 101–111)
CO2: 26 mmol/L (ref 22–32)
Calcium: 9.6 mg/dL (ref 8.9–10.3)
Creatinine, Ser: 1.09 mg/dL (ref 0.61–1.24)
GFR calc Af Amer: >60 mL/min (ref >60)
GFR calc non Af Amer: >60 mL/min (ref >60)
GLUCOSE: 147 mg/dL — AB (ref 65–99)
POTASSIUM: 3.7 mmol/L (ref 3.5–5.1)
Sodium: 138 mmol/L (ref 135–145)

## 2016-11-05 LAB — SURGICAL PATHOLOGY

## 2016-11-05 LAB — GLUCOSE, CAPILLARY
Glucose-Capillary: 154 mg/dL — ABNORMAL HIGH (ref 65–99)
Glucose-Capillary: 90 mg/dL (ref 65–99)
Glucose-Capillary: 139 mg/dL — ABNORMAL HIGH (ref 65–99)

## 2016-11-05 LAB — CREATININE, SERUM
CREATININE: 1.06 mg/dL (ref 0.61–1.24)
GFR calc Af Amer: >60 mL/min (ref >60)
GFR calc non Af Amer: >60 mL/min (ref >60)

## 2016-11-05 MED ORDER — OXYCODONE-ACETAMINOPHEN 5-325 MG PO TABS: 1.0000 | ORAL_TABLET | Freq: Two times a day (BID) | ORAL | 0 refills | Status: DC | PRN

## 2016-11-05 MED ORDER — PIPERACILLIN-TAZOBACTAM 3.375 G IVPB: INTRAVENOUS | Status: AC

## 2016-11-05 NOTE — Progress Notes (Signed)
[name] NAME: Kyle Banks    MR#:  [id number]  DATE OF BIRTH:  04-22-1955  SUBJECTIVE:  Still waiting for insurance authorization for placement.  Patient seems somewhat frustrated and says if insurance does not approve or takes longer.  I might as well just go home REVIEW OF SYSTEMS:    Review of Systems  Constitutional: Negative for chills and fever.  HENT: Negative for congestion and tinnitus.   Eyes: Negative for blurred vision and double vision.  Respiratory: Negative for cough, shortness of breath and wheezing.   Cardiovascular: Negative for chest pain, orthopnea and PND.  Gastrointestinal: Negative for abdominal pain, diarrhea, nausea and vomiting.  Genitourinary: Negative for dysuria and hematuria.  Neurological: Negative for dizziness, sensory change and focal weakness.  All other systems reviewed and are negative.   Nutrition: Carb modified.  Tolerating Diet: Yes Tolerating PT: STR/SNF  DRUG ALLERGIES:  No Known Allergies  VITALS:  Blood pressure (!) 146/78, pulse 65, temperature 98.8 F (37.1 C), resp. rate 20, height 5\' 11"  (1.803 m), weight 112 kg (246 lb 14.6 oz), SpO2 99 %.  PHYSICAL EXAMINATION:   Physical Exam  GENERAL:  61 y.o.-year-old patient lying in bed in no acute distress.  EYES: Pupils equal, round, reactive to light and accommodation. No scleral icterus. Extraocular muscles intact.  HEENT: Head atraumatic, normocephalic. Oropharynx and nasopharynx clear.  NECK:  Supple, no jugular venous distention. No thyroid enlargement, no tenderness.  LUNGS: Normal breath sounds bilaterally, no wheezing, rales, rhonchi. No use of accessory muscles of respiration.  CARDIOVASCULAR: S1, S2 normal. No murmurs, rubs, or gallops.  ABDOMEN: Soft, nontender, nondistended. Bowel sounds present. No organomegaly or mass.  EXTREMITIES: No cyanosis, clubbing or edema b/l. B/l foot dressings in place due to surgery. No  drainage or bleeding noted.  NEUROLOGIC: Cranial nerves II through XII are intact. No focal Motor or sensory deficits b/l.   PSYCHIATRIC: The patient is alert and oriented x 3.  SKIN: No obvious rash, lesion, or ulcer.  LABORATORY PANEL:   CBC  Recent Labs Lab 11/02/16 0550  WBC 7.0  HGB 9.2*  HCT 27.8*  PLT 255   ------------------------------------------------------------------------------------------------------------------  Chemistries   Recent Labs Lab 10/31/16 1335 11/01/16 0530 11/05/16 0443  NA 137 136  --   K 3.7 4.0  --   CL 106 106  --   CO2 24 26  --   GLUCOSE 141* 230*  --   BUN 18 16  --   CREATININE 0.99 0.87 1.06  CALCIUM 9.2 8.9  --   MG 1.7  --   --   AST 14*  --   --   ALT 11*  --   --   ALKPHOS 41  --   --   BILITOT 0.5  --   --    ------------------------------------------------------------------------------------------------------------------  Cardiac Enzymes No results for input(s): TROPONINI in the last 168 hours. ------------------------------------------------------------------------------------------------------------------  RADIOLOGY:  No results found.   ASSESSMENT AND PLAN:  61 year old male with past medical history of diabetes, chronic anemia, hyperlipidemia presented to the hospital due to bilateral diabetic foot ulcers.  1. Bilateral diabetic foot ulcers-seen by podiatry and status post incision and drainage. Postop day # 4 today. -Clinically afebrile and hemodynamically stable. White cell count stable. Appreciate podiatry input and continue empiric Zosyn for now. -Patient is being followed by infectious disease and continue Zosyn until 11/13/16 - Continue local wound care as per podiatry and dressings  changes  2. Diabetes type 2 without complication-continue Lantus, NovoLog with meals and sliding scale insulin. Blood sugar stable.  3. Chronic anemia-continue iron supplements.  4. Hyperlipidemia-continue  fenofibrate.     PT recommends STR/SNF  but await insurance authorization.     All the records are reviewed and case discussed with Care Management/Social Worker. Management plans discussed with the patient, family and they are in agreement.  CODE STATUS: Full  DVT Prophylaxis: Lovenox  TOTAL TIME TAKING CARE OF THIS PATIENT: 25 minutes.   POSSIBLE D/C later today vs tomorrow DEPENDING ON insurance authorization in bed placement   Max Sane M.D on 11/05/2016 at 10:18 AM  Between 7am to 6pm - Pager - [phone]  After 6pm go to [email or web address] - Proofreader  [facility]  Office  [phone]  CC: Primary care physician; Patient, No Pcp Per

## 2016-11-05 NOTE — Progress Notes (Signed)
Telephone report called to Johny Blamer at [employer].

## 2016-11-05 NOTE — Progress Notes (Signed)
Verbal report given to [facility] with EMS.  Patient taken out via stretcher to be taken to [facility].

## 2016-11-05 NOTE — Progress Notes (Signed)
Patient is medically stable for D/C back to [facility] today. Per [facility] authorization has been received and patient can come today to room 712. RN will call report to RN Yaakov Guthrie at [phone] and arrange EMS for transport. Clinical Education officer, museum (CSW) sent D/C orders to [facility] via [facility]. Patient is aware of above. CSW contacted patient's daughter Braulio Kiedrowski and made her aware of above. Please reconsult if future social work needs arise. CSW signing off.   [employer], LCSW [phone]

## 2016-11-05 NOTE — Discharge Summary (Addendum)
[facility] at [place] NAME: Kyle Banks    MR#:  [id number]  DATE OF BIRTH:  1955-10-21  DATE OF ADMISSION:  10/31/2016   ADMITTING PHYSICIAN: Demetrios Loll, MD  DATE OF DISCHARGE: 11/05/2016  PRIMARY CARE PHYSICIAN: Patient, No Pcp Per   ADMISSION DIAGNOSIS:  Osteomyelitis  n/a DISCHARGE DIAGNOSIS:  Active Problems:   Left foot infection  SECONDARY DIAGNOSIS:   Past Medical History:  Diagnosis Date  . Anemia   . Diabetes mellitus ([place])   . Osteomyelitis of foot [facility])    HOSPITAL COURSE:  61 year old male with past medical history of diabetes, chronic anemia, hyperlipidemia presented to the hospital due to bilateral diabetic foot ulcers.  1. Bilateral diabetic foot ulcers-seen by podiatry and status post incision and drainage. Postop day # 4 today. -Clinically afebrile and hemodynamically stable. White cell count stable. continue Zosyn till 11/13/2016 - Continue local wound care as per podiatry and dressings changes. B/l foot dressings in place  DISCHARGE CONDITIONS:  stable CONSULTS OBTAINED:  Treatment Team:  Sharlotte Alamo, DPM DRUG ALLERGIES:  No Known Allergies DISCHARGE MEDICATIONS:   Allergies as of 11/05/2016   No Known Allergies     Medication List    STOP taking these medications   enoxaparin 40 MG/0.4ML injection Commonly known as:  LOVENOX     TAKE these medications   aspirin 81 MG EC tablet Take 1 tablet (81 mg total) by mouth daily.   cyanocobalamin 500 MCG tablet Take 500 mcg by mouth daily.   fenofibrate 160 MG tablet Take 1 tablet (160 mg total) by mouth daily.   ferrous sulfate 325 (65 FE) MG tablet Take 1 tablet (325 mg total) by mouth 2 (two) times daily with a meal.   Fish Oil 1000 MG Caps Take 1 capsule by mouth daily.   Garlic 10 MG Caps Take 1 tablet by mouth daily.   insulin aspart 100 UNIT/ML injection Commonly known as:  novoLOG Inject 5 Units into the skin 3 (three) times  daily with meals.   insulin glargine 100 UNIT/ML injection Commonly known as:  LANTUS Inject 0.26 mLs (26 Units total) into the skin daily.   nutrition supplement (JUVEN) Pack Take 1 packet by mouth 2 (two) times daily between meals.   oxyCODONE-acetaminophen 5-325 MG tablet Commonly known as:  PERCOCET/ROXICET Take 1 tablet by mouth 2 (two) times daily as needed for severe pain. What changed:  when to take this   piperacillin-tazobactam 3.375 GM/50ML IVPB Commonly known as:  ZOSYN 3.375 gm iv every eight hours through July 31st What changed:  additional instructions   protein supplement shake Liqd Commonly known as:  PREMIER PROTEIN Take 325 mLs (11 oz total) by mouth daily.   senna-docusate 8.6-50 MG tablet Commonly known as:  Senokot-S Take 2 tablets by mouth 2 (two) times daily.        DISCHARGE INSTRUCTIONS:  Discharge antibiotics FAX weekly labs to [phone] Zosyn  3/375               grams every    8          hours PICC Care per protocol Labs weekly while on IV antibiotics      CBC w diff         Comprehensive met panel ESR CRP      Planned duration of antibiotics till July 31st DIET:  Regular diet DISCHARGE CONDITION:  Good ACTIVITY:  Activity as tolerated OXYGEN:  Home  Oxygen: No.  Oxygen Delivery: room air DISCHARGE LOCATION:  nursing home   If you experience worsening of your admission symptoms, develop shortness of breath, life threatening emergency, suicidal or homicidal thoughts you must seek medical attention immediately by calling 911 or calling your MD immediately  if symptoms less severe.  You Must read complete instructions/literature along with all the possible adverse reactions/side effects for all the Medicines you take and that have been prescribed to you. Take any new Medicines after you have completely understood and accpet all the possible adverse reactions/side effects.   Please note  You were cared for by a hospitalist during  your hospital stay. If you have any questions about your discharge medications or the care you received while you were in the hospital after you are discharged, you can call the unit and asked to speak with the hospitalist on call if the hospitalist that took care of you is not available. Once you are discharged, your primary care physician will handle any further medical issues. Please note that NO REFILLS for any discharge medications will be authorized once you are discharged, as it is imperative that you return to your primary care physician (or establish a relationship with a primary care physician if you do not have one) for your aftercare needs so that they can reassess your need for medications and monitor your lab values.    On the day of Discharge:  VITAL SIGNS:  Blood pressure (!) 146/78, pulse 65, temperature 98.8 F (37.1 C), resp. rate 20, height _0  (1.803 m), weight 112 kg (246 lb 14.6 oz), SpO2 99 %. PHYSICAL EXAMINATION:  GENERAL:  61 y.o.-year-old patient lying in the bed with no acute distress.  EYES: Pupils equal, round, reactive to light and accommodation. No scleral icterus. Extraocular muscles intact.  HEENT: Head atraumatic, normocephalic. Oropharynx and nasopharynx clear.  NECK:  Supple, no jugular venous distention. No thyroid enlargement, no tenderness.  LUNGS: Normal breath sounds bilaterally, no wheezing, rales,rhonchi or crepitation. No use of accessory muscles of respiration.  CARDIOVASCULAR: S1, S2 normal. No murmurs, rubs, or gallops.  ABDOMEN: Soft, non-tender, non-distended. Bowel sounds present. No organomegaly or mass.  EXTREMITIES: No pedal edema, cyanosis, or clubbing. B/l foot dressings in place  NEUROLOGIC: Cranial nerves II through XII are intact. Muscle strength 5/5 in all extremities. Sensation intact. Gait not checked.  PSYCHIATRIC: The patient is alert and oriented x 3.  SKIN: No obvious rash, lesion, or ulcer. B/l foot dressings in place  DATA  REVIEW:   CBC  Recent Labs Lab 11/02/16 0550  WBC 7.0  HGB 9.2*  HCT 27.8*  PLT 255    Chemistries   Recent Labs Lab 10/31/16 1335  11/05/16 1103  NA 137  < > 138  K 3.7  < > 3.7  CL 106  < > 105  CO2 24  < > 26  GLUCOSE 141*  < > 147*  BUN 18  < > 12  CREATININE 0.99  < > 1.09  CALCIUM 9.2  < > 9.6  MG 1.7  --   --   AST 14*  --   --   ALT 11*  --   --   ALKPHOS 41  --   --   BILITOT 0.5  --   --   < > = values in this interval not displayed.    Contact information for follow-up providers    Leonel Ramsay, MD. Schedule an appointment as soon as possible  for a visit in 2 week(s).   Specialty:  Infectious Diseases Contact information: [place] [address] [phone]            Contact information for after-discharge care    Destination    [facility] Follow up.   Specialty:  [place] information: [address] [place] [phone]                 Management plans discussed with the patient, family and they are in agreement.  CODE STATUS: Full Code   TOTAL TIME TAKING CARE OF THIS PATIENT: 45 minutes.    Max Sane M.D on 11/05/2016 at 11:40 AM  Between 7am to 6pm - Pager - [phone]  After 6pm go to [email or web address] - Proofreader  [facility]  Office  [phone]  CC: Primary care physician; Patient, No Pcp Per   Note: This dictation was prepared with Dragon dictation along with smaller phrase technology. Any transcriptional errors that result from this process are unintentional.

## 2016-11-05 NOTE — Progress Notes (Signed)
Abnormal EKG results called to Dr. Manuella Ghazi.  Orders to be placed to check patient electrolytes.

## 2016-11-05 NOTE — Progress Notes (Signed)
Pharmacy Antibiotic Note  Kyle Banks is a 61 y.o. male admitted on 10/31/2016 with left foot infection.  Pharmacy has been consulted for Zosyn dosing.  Plan: Zosyn 3.375g IV q8h (4 hour infusion).    Height: 5\' 11"  (180.3 cm) Weight: 246 lb 14.6 oz (112 kg) IBW/kg (Calculated) : 75.3  Temp (24hrs), Avg:98.8 F (37.1 C), Min:98.8 F (37.1 C), Max:98.8 F (37.1 C)   Recent Labs Lab 10/31/16 1335 11/01/16 0530 11/02/16 0550 11/05/16 0443  WBC 5.5 6.4 7.0  --   CREATININE 0.99 0.87  --  1.06    Estimated Creatinine Clearance: 93.2 mL/min (by C-G formula based on SCr of 1.06 mg/dL).    No Known Allergies  Antimicrobials this admission: Zosyn 7/18 >>  Dose adjustments this admission:   Microbiology results:   Thank you for allowing pharmacy to be a part of this patient's care.  Rocky Morel 11/05/2016 7:31 AM

## 2016-11-05 NOTE — Progress Notes (Signed)
CH made a follow up visit with pt. Pt was sitting on the bed watching Tv at the time of this visit. Pt stated that he had been frustrated by the insurance reluctance to approved his return to [facility] facility where he was getting therapy. English encouraged pt, asked him to be patient, and prayed the the LORD will give the patient an answered today. About 5 minutes later the social worker returned and informed me that the insurance had approved the patient to return to [facility]. Pt asked social worker to call me to comeback to his room briefly to hear the great news he received. Pt was overjoyed. Now he will be transitioning to [facility] today.   11/05/16 1100  Clinical Encounter Type  Visited With Patient  Visit Type Follow-up;Spiritual support  Referral From Chaplain  Consult/Referral To Chaplain  Spiritual Encounters  Spiritual Needs Prayer

## 2016-11-05 NOTE — Discharge Instructions (Signed)

## 2017-11-12 ENCOUNTER — Other Ambulatory Visit
Admission: RE | Admit: 2017-11-12 | Discharge: 2017-11-12 | Disposition: A | Discharge status: Home / Self Care | Payer: BLUE CROSS/BLUE SHIELD | Source: Ambulatory Visit | Attending: Gastroenterology | Admitting: Gastroenterology

## 2017-11-12 DIAGNOSIS — R194 Change in bowel habit: Secondary | ICD-10-CM | POA: Diagnosis present

## 2017-11-12 DIAGNOSIS — R197 Diarrhea, unspecified: Secondary | ICD-10-CM | POA: Diagnosis present

## 2017-11-12 LAB — GASTROINTESTINAL PANEL BY PCR, STOOL (REPLACES STOOL CULTURE)

## 2017-11-12 LAB — C DIFFICILE QUICK SCREEN W PCR REFLEX
C DIFFICILE (CDIFF) INTERP: NOT DETECTED
C DIFFICILE (CDIFF) TOXIN: NEGATIVE
C Diff antigen: NEGATIVE

## 2017-11-26 ENCOUNTER — Other Ambulatory Visit
Admission: RE | Admit: 2017-11-26 | Discharge: 2017-11-26 | Disposition: A | Discharge status: Home / Self Care | Payer: BLUE CROSS/BLUE SHIELD | Source: Ambulatory Visit | Attending: Internal Medicine | Admitting: Internal Medicine

## 2017-11-26 DIAGNOSIS — R197 Diarrhea, unspecified: Secondary | ICD-10-CM | POA: Diagnosis present

## 2017-11-26 LAB — GASTROINTESTINAL PANEL BY PCR, STOOL (REPLACES STOOL CULTURE)
Adenovirus F40/41: NOT DETECTED
Astrovirus: NOT DETECTED
CYCLOSPORA CAYETANENSIS: NOT DETECTED
Campylobacter species: NOT DETECTED
Cryptosporidium: NOT DETECTED
Entamoeba histolytica: NOT DETECTED
Enteroaggregative E coli (EAEC): NOT DETECTED
Enteropathogenic E coli (EPEC): NOT DETECTED
Enterotoxigenic E coli (ETEC): NOT DETECTED
Giardia lamblia: NOT DETECTED
Norovirus GI/GII: NOT DETECTED
PLESIMONAS SHIGELLOIDES: NOT DETECTED
Rotavirus A: NOT DETECTED
SALMONELLA SPECIES: NOT DETECTED
SAPOVIRUS (I, II, IV, AND V): NOT DETECTED
SHIGA LIKE TOXIN PRODUCING E COLI (STEC): NOT DETECTED
SHIGELLA/ENTEROINVASIVE E COLI (EIEC): NOT DETECTED
VIBRIO SPECIES: NOT DETECTED
Vibrio cholerae: NOT DETECTED
Yersinia enterocolitica: NOT DETECTED

## 2017-11-29 ENCOUNTER — Other Ambulatory Visit
Admission: RE | Admit: 2017-11-29 | Discharge: 2017-11-29 | Disposition: A | Discharge status: Home / Self Care | Payer: BLUE CROSS/BLUE SHIELD | Source: Ambulatory Visit | Attending: Gastroenterology | Admitting: Gastroenterology

## 2017-11-29 DIAGNOSIS — K529 Noninfective gastroenteritis and colitis, unspecified: Secondary | ICD-10-CM | POA: Insufficient documentation

## 2017-11-29 DIAGNOSIS — D649 Anemia, unspecified: Secondary | ICD-10-CM | POA: Diagnosis present

## 2017-12-02 LAB — CALPROTECTIN, FECAL

## 2019-05-14 IMAGING — CT CT HEAD W/O CM
3 series · 15 of 47 positions shown, 18 images · non-contrast
Comparison: None.

CLINICAL DATA: Altered mental status.

EXAM:
CT HEAD WITHOUT CONTRAST
TECHNIQUE: Contiguous axial images were obtained from the base of the skull
through the vertex without intravenous contrast.

[Series 2: head wo · axial · 0.47mm/px · z∈[+281,+406]mm · 9 of 30 slices shown, 12 images]
[im 3/30  brain]
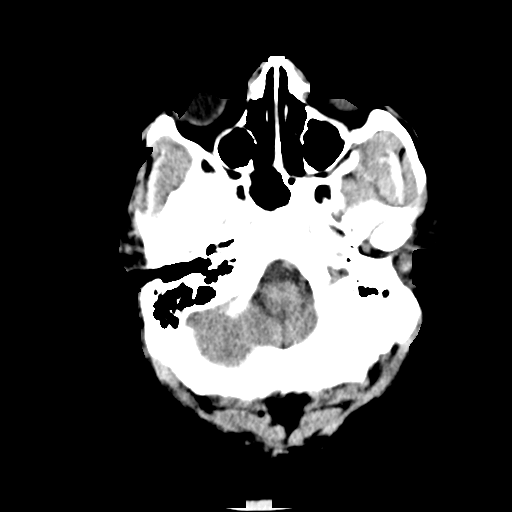
[im 3/30  bone]
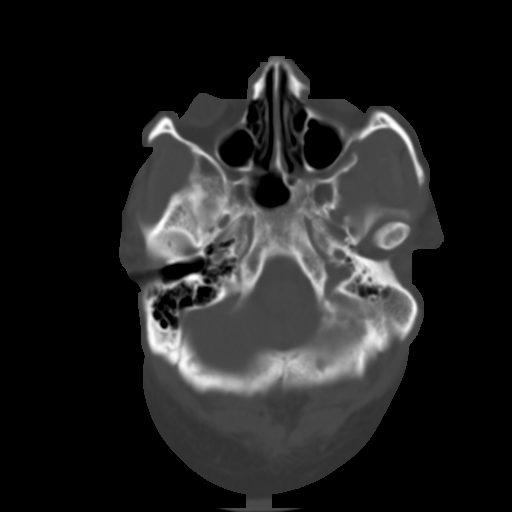
[im 6/30  brain]
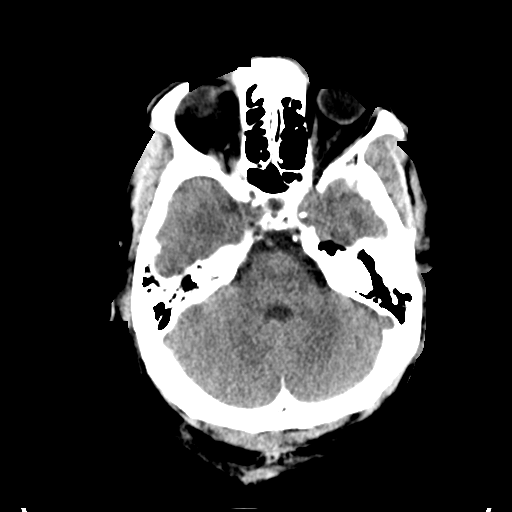
[im 9/30  brain]
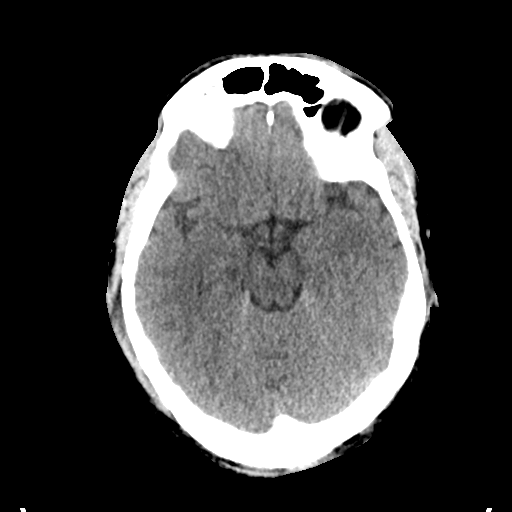
[im 12/30  brain]
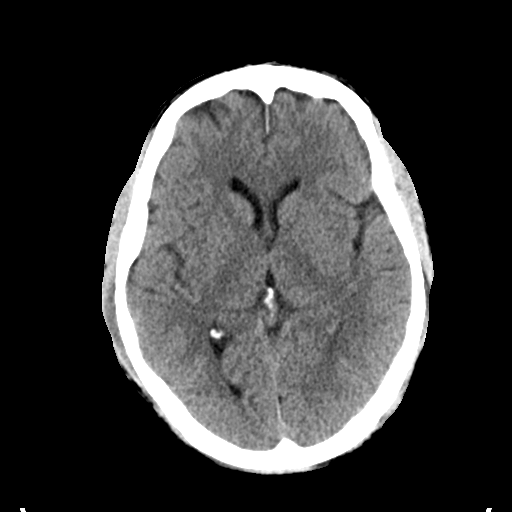
[im 16/30  brain]
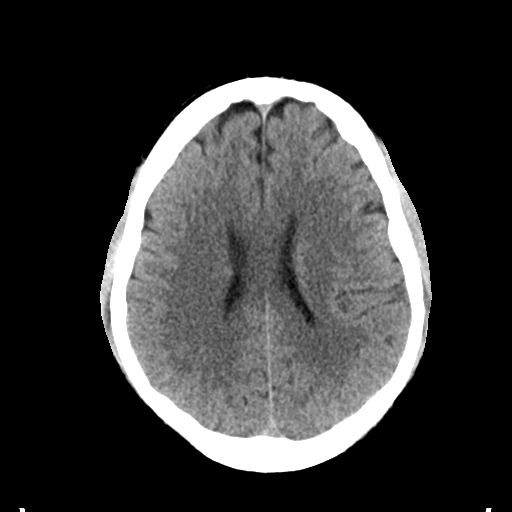
[im 16/30  bone]
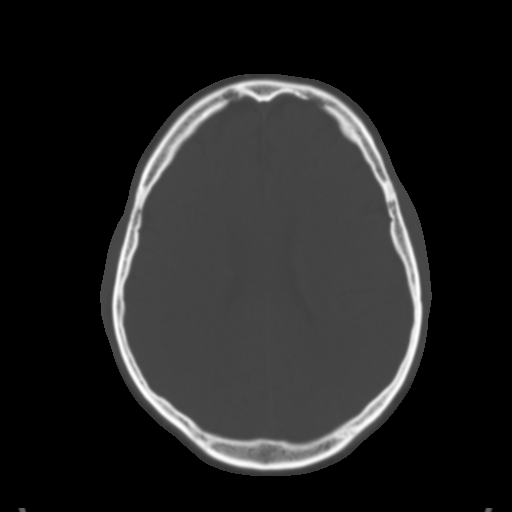
[im 19/30  brain]
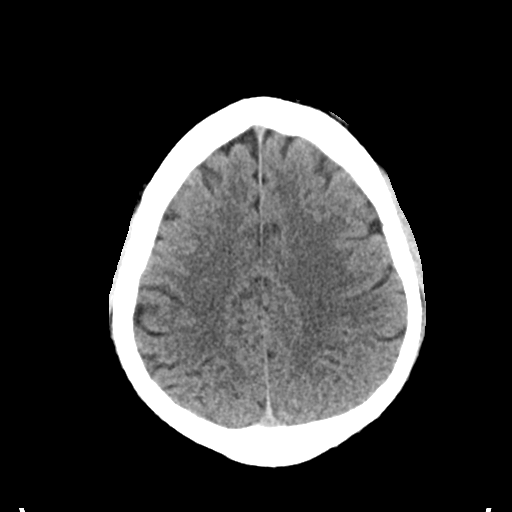
[im 22/30  brain]
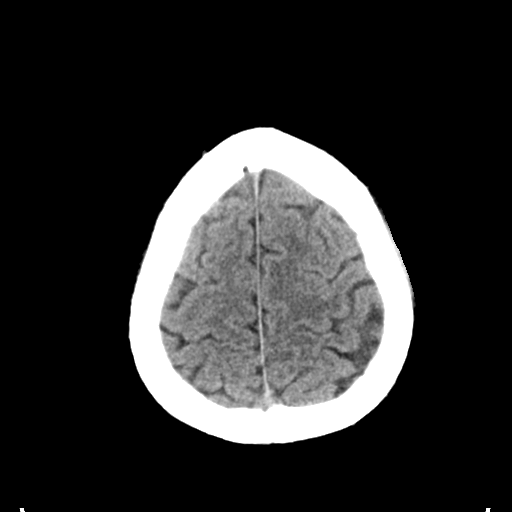
[im 25/30  brain]
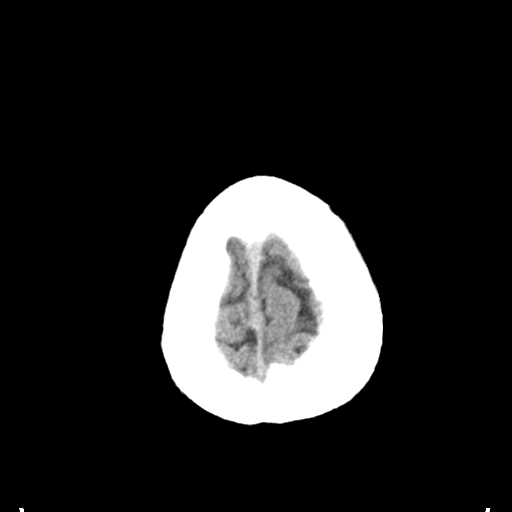
[im 28/30  brain]
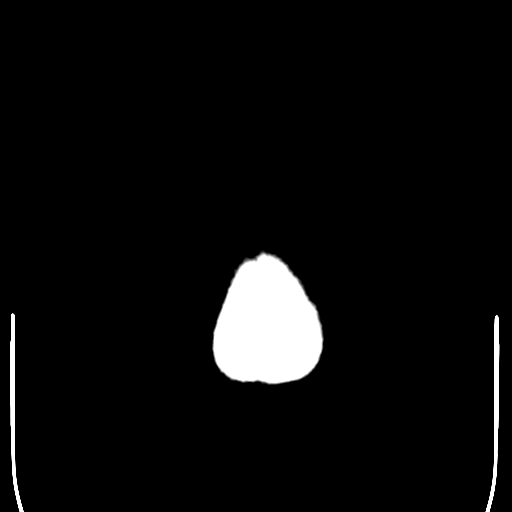
[im 28/30  bone]
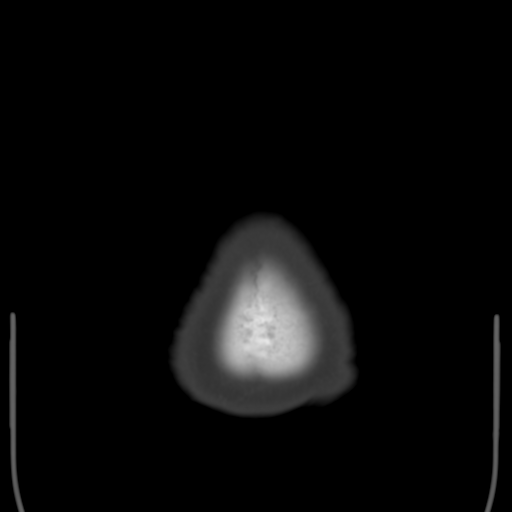

[Series 4: coronal soft tissue · coronal · 0.30mm/px · 3 of 67 slices shown]
[im 23/67  brain]
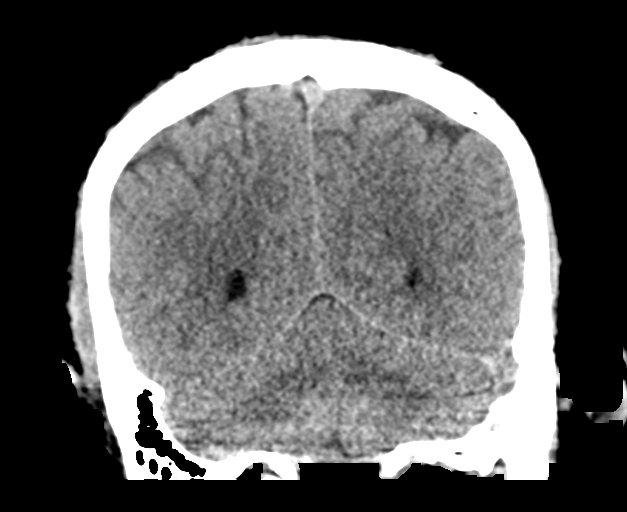
[im 30/67  brain]
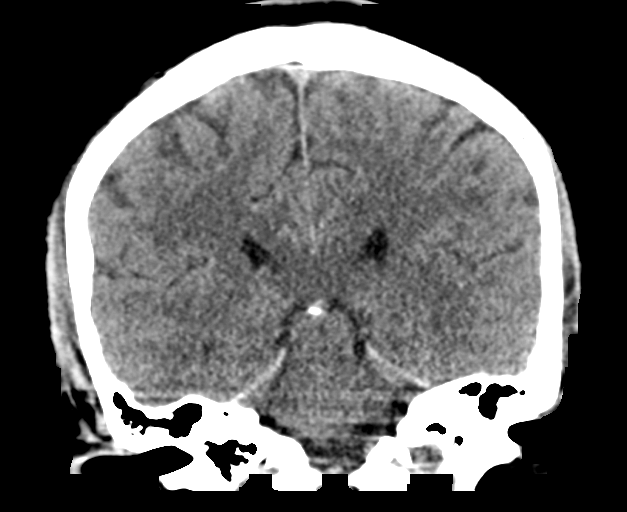
[im 37/67  brain]
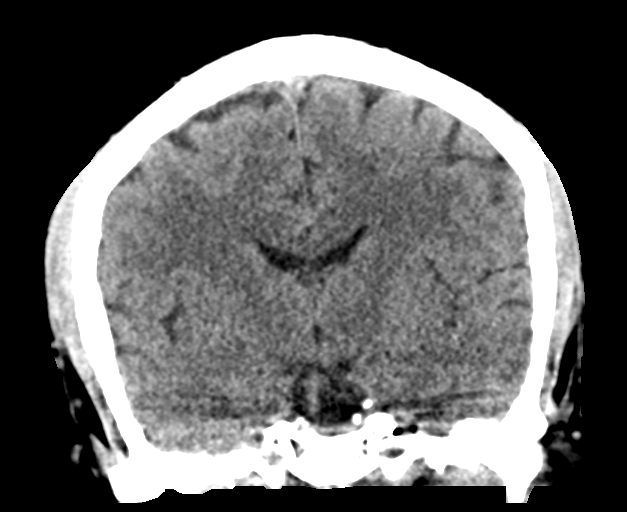

[Series 5: sagittal soft tissue · sagittal · 0.31mm/px · 3 of 57 slices shown]
[im 19/57  brain]
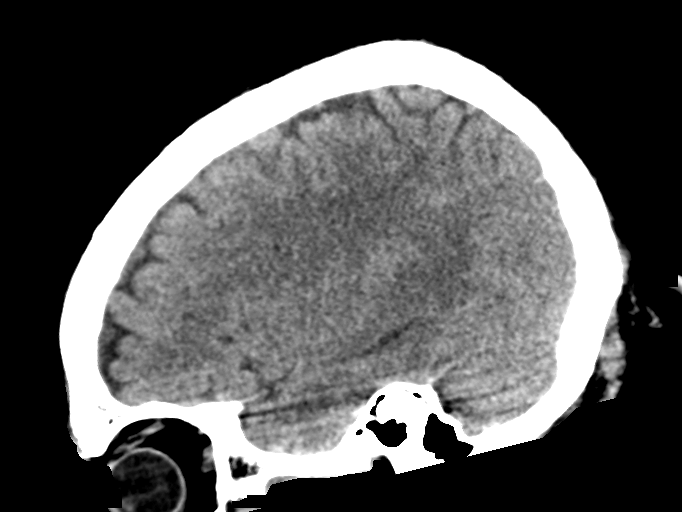
[im 29/57  brain]
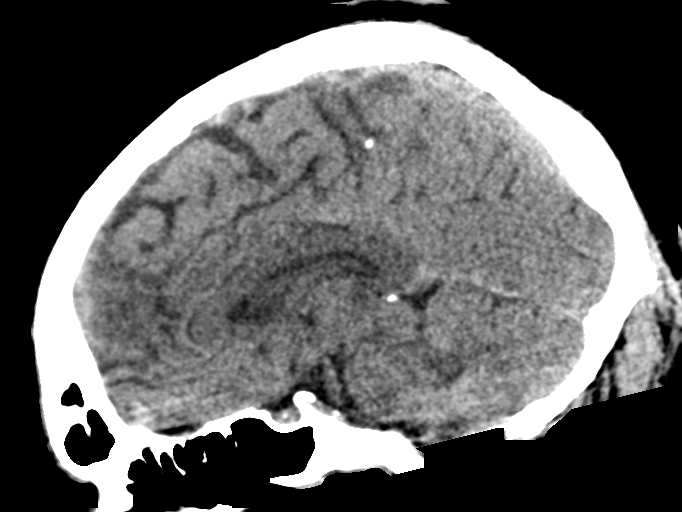
[im 38/57  brain]
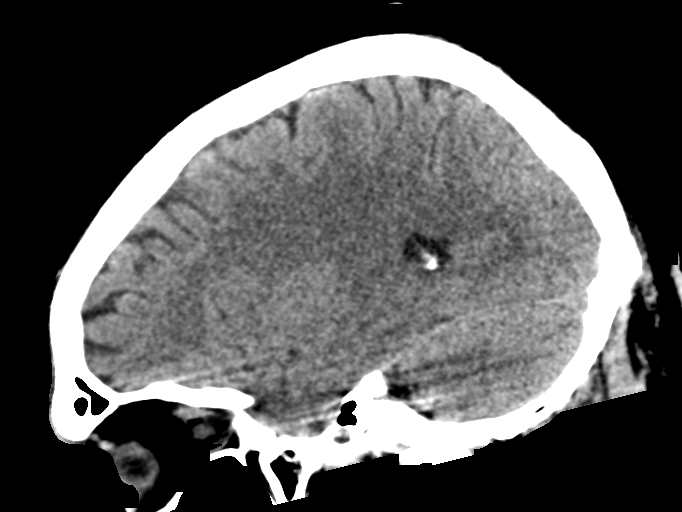

[15 of 47 positions shown; findings below may reference images not displayed]

FINDINGS: Brain: No evidence of acute infarction, hemorrhage, hydrocephalus,
extra-axial collection or mass lesion/mass effect.

Vascular: No hyperdense vessel or unexpected calcification.

Skull: Normal. Negative for fracture or focal lesion.

Sinuses/Orbits: No acute finding.

Other: None.
IMPRESSION: Normal head CT.

## 2019-06-18 ENCOUNTER — Ambulatory Visit: Payer: Medicare Other | Attending: Internal Medicine

## 2019-06-18 DIAGNOSIS — Z23 Encounter for immunization: Secondary | ICD-10-CM | POA: Insufficient documentation

## 2019-06-18 NOTE — Progress Notes (Signed)
   Covid-19 Vaccination Clinic  Name:  Kyle Banks    MRN: [id number] DOB: 12/28/1955  06/18/2019  Kyle Banks was observed post Covid-19 immunization for 15 minutes without incident. He was provided with Vaccine Information Sheet and instruction to access the V-Safe system.   Kyle Banks was instructed to call 911 with any severe reactions post vaccine: Marland Kitchen Difficulty breathing  . Swelling of face and throat  . A fast heartbeat  . A bad rash all over body  . Dizziness and weakness   Immunizations Administered    Name Date Dose VIS Date Route   Pfizer COVID-19 Vaccine 06/18/2019 11:53 AM 0.3 mL 03/27/2019 Intramuscular   Manufacturer: [place]   Lot: [id number]   [place]: [id number]

## 2019-07-09 ENCOUNTER — Ambulatory Visit: Payer: Medicare Other | Attending: Internal Medicine

## 2019-07-09 DIAGNOSIS — Z23 Encounter for immunization: Secondary | ICD-10-CM

## 2019-07-09 NOTE — Progress Notes (Signed)
   Covid-19 Vaccination Clinic  Name:  Avinash Griffee    MRN: [id number] DOB: Aug 20, 1955  07/09/2019  Mr. Nutt was observed post Covid-19 immunization for 15 minutes without incident. He was provided with Vaccine Information Sheet and instruction to access the V-Safe system.   Mr. Trease was instructed to call 911 with any severe reactions post vaccine: Marland Kitchen Difficulty breathing  . Swelling of face and throat  . A fast heartbeat  . A bad rash all over body  . Dizziness and weakness   Immunizations Administered    Name Date Dose VIS Date Route   Pfizer COVID-19 Vaccine 07/09/2019  9:38 AM 0.3 mL 03/27/2019 Intramuscular   Manufacturer: [place]   Lot: [id number]   [place]: [id number]

## 2021-03-31 ENCOUNTER — Other Ambulatory Visit: Payer: Self-pay

## 2021-03-31 ENCOUNTER — Encounter: Payer: Self-pay | Admitting: *Deleted

## 2021-03-31 ENCOUNTER — Encounter: Payer: Medicare Other | Attending: Internal Medicine | Admitting: *Deleted

## 2021-03-31 VITALS — BP 140/80 | Ht 67.0 in | Wt 249.3 lb

## 2021-03-31 DIAGNOSIS — Z6838 Body mass index (BMI) 38.0-38.9, adult: Secondary | ICD-10-CM | POA: Insufficient documentation

## 2021-03-31 DIAGNOSIS — R600 Localized edema: Secondary | ICD-10-CM | POA: Diagnosis not present

## 2021-03-31 DIAGNOSIS — D649 Anemia, unspecified: Secondary | ICD-10-CM | POA: Diagnosis not present

## 2021-03-31 DIAGNOSIS — E1165 Type 2 diabetes mellitus with hyperglycemia: Secondary | ICD-10-CM | POA: Insufficient documentation

## 2021-03-31 DIAGNOSIS — E669 Obesity, unspecified: Secondary | ICD-10-CM | POA: Diagnosis not present

## 2021-03-31 DIAGNOSIS — Z794 Long term (current) use of insulin: Secondary | ICD-10-CM | POA: Insufficient documentation

## 2021-03-31 DIAGNOSIS — I10 Essential (primary) hypertension: Secondary | ICD-10-CM | POA: Diagnosis not present

## 2021-03-31 DIAGNOSIS — E782 Mixed hyperlipidemia: Secondary | ICD-10-CM | POA: Diagnosis not present

## 2021-03-31 DIAGNOSIS — E1142 Type 2 diabetes mellitus with diabetic polyneuropathy: Secondary | ICD-10-CM

## 2021-03-31 DIAGNOSIS — G608 Other hereditary and idiopathic neuropathies: Secondary | ICD-10-CM | POA: Insufficient documentation

## 2021-03-31 NOTE — Progress Notes (Signed)
Diabetes Self-Management Education  Visit Type: First/Initial  Appt. Start Time: 1100 Appt. End Time: 1215  03/31/2021  [name], identified by name and date of birth, is a 65 y.o. male with a diagnosis of Diabetes: Type 2.   ASSESSMENT  Blood pressure 140/80, height 5\' 7"  (1.702 m), weight 249 lb 4.8 oz (113.1 kg). Body mass index is 39.05 kg/m.   Diabetes Self-Management Education - 03/31/21 1330       Visit Information   Visit Type First/Initial      Initial Visit   Diabetes Type Type 2    Are you currently following a meal plan? Yes    What type of meal plan do you follow? "eat meat and vegetables"    Are you taking your medications as prescribed? Yes    Date Diagnosed 5 years      Health Coping   How would you rate your overall health? Fair      Psychosocial Assessment   Patient Belief/Attitude about Diabetes Other (comment)   "I can't stand it"   Self-care barriers None    Self-management support Doctor's office;Friends;Family    Patient Concerns Nutrition/Meal planning;Weight Control;Healthy Lifestyle    Special Needs None    Preferred Learning Style Visual   talking/discussion   Learning Readiness Ready    How often do you need to have someone help you when you read instructions, pamphlets, or other written materials from your doctor or pharmacy? 1 - Never    What is the last grade level you completed in school? 3 years college      Pre-Education Assessment   Patient understands the diabetes disease and treatment process. Needs Instruction    Patient understands incorporating nutritional management into lifestyle. Needs Instruction    Patient undertands incorporating physical activity into lifestyle. Needs Instruction    Patient understands using medications safely. Needs Review    Patient understands monitoring blood glucose, interpreting and using results Needs Review    Patient understands prevention, detection, and treatment of acute  complications. Needs Instruction    Patient understands prevention, detection, and treatment of chronic complications. Needs Review    Patient understands how to develop strategies to address psychosocial issues. Needs Instruction    Patient understands how to develop strategies to promote health/change behavior. Needs Instruction      Complications   Last HgB A1C per patient/outside source 10.2 %   03/13/2021   How often do you check your blood sugar? 1-2 times/day    Fasting Blood glucose range (mg/dL) 70-129;130-179   Pt reports FBG's 120-130's mg/dL.   Postprandial Blood glucose range (mg/dL) --   He reports bedtime readings of 120-140's mg/dL.   Have you had a dilated eye exam in the past 12 months? Yes    Have you had a dental exam in the past 12 months? No    Are you checking your feet? No      Dietary Intake   Breakfast oatmeal, sausage, eggs, bacon    Snack (morning) reports 1 snack/day - popcorn, nuts (pecans, almonds, walnuts)    Lunch skips    Dinner chicken, fish, [place], pork; peas, pintos, lima beans, some pasta, broccoli, cauliflower, greens, salads with lettuce, carrots, cuccumbers, tomatoes, cheese    Beverage(s) water, milk      Exercise   Exercise Type ADL's      Patient Education   Previous Diabetes Education No    Disease state  Explored patient's options for treatment of their diabetes  Nutrition management  Role of diet in the treatment of diabetes and the relationship between the three main macronutrients and blood glucose level;Food label reading, portion sizes and measuring food.;Reviewed blood glucose goals for pre and post meals and how to evaluate the patients' food intake on their blood glucose level.;Meal timing in regards to the patients' current diabetes medication.    Physical activity and exercise  Role of exercise on diabetes management, blood pressure control and cardiac health.    Medications Taught/reviewed insulin injection, site rotation,  insulin storage and needle disposal.;Reviewed patients medication for diabetes, action, purpose, timing of dose and side effects.    Monitoring Purpose and frequency of SMBG.;Taught/discussed recording of test results and interpretation of SMBG.;Identified appropriate SMBG and/or A1C goals.;Daily foot exams    Acute complications Taught treatment of hypoglycemia - the 15 rule.    Chronic complications Relationship between chronic complications and blood glucose control    Psychosocial adjustment Identified and addressed patients feelings and concerns about diabetes      Individualized Goals (developed by patient)   Reducing Risk Other (comment)   improve blood sugars, lose weight, lead a healthier lifestyle, become more fit     Outcomes   Expected Outcomes Demonstrated interest in learning. Expect positive outcomes    Program Status Not Completed        Individualized Plan for Diabetes Self-Management Training:   Learning Objective:  Patient will have a greater understanding of diabetes self-management. Patient education plan is to attend individual and/or group sessions per assessed needs and concerns.   Plan:   Patient Instructions  Check blood sugars 2 x day before breakfast and 2 hrs after supper every day  Exercise:  Begin walking or stationary bike for  15  minutes  3  days a week and gradually increase to 30 minutes 5 days a week  Eat 3 meals day,   1-2  snacks a day Space meals 4-6 hours apart Don't skip meals - eat at least 1 protein and 1 carbohydrate serving  Carry fast acting glucose and a snack at all times Rotate injection sites Hold insulin pen in place for 5-10 seconds after injection  Check feet daily  Call back if you want to attend Diabetes classes or have an appointment with the nurse or dietitian  Expected Outcomes:  Demonstrated interest in learning. Expect positive outcomes  Education material provided:  General Meal Planning Guidelines Simple Meal  Plan Insulin pen injection guide (BD) Glucose tablets Symptoms, causes and treatments of Hypoglycemia   If problems or questions, patient to contact team via:   Johny Drilling, [place], [place], [place] [phone]  Future DSME appointment:  The patient doesn't want to return for any further diabetes education at this time.

## 2021-03-31 NOTE — Patient Instructions (Addendum)
Check blood sugars 2 x day before breakfast and 2 hrs after supper every day  Exercise:  Begin walking or stationary bike for  15  minutes  3  days a week and gradually increase to 30 minutes 5 days a week  Eat 3 meals day,   1-2  snacks a day Space meals 4-6 hours apart Don't skip meals - eat at least 1 protein and 1 carbohydrate serving  Carry fast acting glucose and a snack at all times Rotate injection sites Hold insulin pen in place for 5-10 seconds after injection  Check feet daily  Call back if you want to attend Diabetes classes or have an appointment with the nurse or dietitian

## 2023-06-26 ENCOUNTER — Encounter: Payer: Self-pay | Admitting: Ophthalmology

## 2023-06-26 NOTE — Anesthesia Preprocedure Evaluation (Addendum)
 Anesthesia Evaluation  Patient identified by MRN, date of birth, ID band Patient awake    Reviewed: Allergy & Precautions, H&P , NPO status , Patient's Chart, lab work & pertinent test results  Airway Mallampati: IV  TM Distance: >3 FB   Mouth opening: Limited Mouth Opening  Dental  (+) Chipped Chipped left upper central incisor:   Pulmonary neg pulmonary ROS, sleep apnea           Cardiovascular hypertension, negative cardio ROS      Neuro/Psych negative neurological ROS  negative psych ROS   GI/Hepatic negative GI ROS, Neg liver ROS,,,  Endo/Other  negative endocrine ROSdiabetes    Renal/GU negative Renal ROS  negative genitourinary   Musculoskeletal negative musculoskeletal ROS (+)    Abdominal   Peds negative pediatric ROS (+)  Hematology negative hematology ROS (+) Blood dyscrasia, anemia   Anesthesia Other Findings Patient had polysomnography but can't read results on Epic Diabetes mellitus (HCC) Osteomyelitis of foot (HCC) Anemia  Hypertension Sleep apnea      Reproductive/Obstetrics negative OB ROS                             Anesthesia Physical Anesthesia Plan  ASA: 2  Anesthesia Plan: MAC   Post-op Pain Management:    Induction: Intravenous  PONV Risk Score and Plan:   Airway Management Planned: Natural Airway and Nasal Cannula  Additional Equipment:   Intra-op Plan:   Post-operative Plan:   Informed Consent: I have reviewed the patients History and Physical, chart, labs and discussed the procedure including the risks, benefits and alternatives for the proposed anesthesia with the patient or authorized representative who has indicated his/her understanding and acceptance.     Dental Advisory Given  Plan Discussed with: Anesthesiologist, CRNA and Surgeon  Anesthesia Plan Comments: (Patient consented for risks of anesthesia including but not limited to:   - adverse reactions to medications - damage to eyes, teeth, lips or other oral mucosa - nerve damage due to positioning  - sore throat or hoarseness - Damage to heart, brain, nerves, lungs, other parts of body or loss of life  Patient voiced understanding and assent.)       Anesthesia Quick Evaluation

## 2023-06-26 NOTE — Discharge Instructions (Signed)

## 2023-07-02 ENCOUNTER — Encounter
Admission: RE | Disposition: A | Discharge status: Home / Self Care | Payer: Self-pay | Source: Home / Self Care | Attending: Ophthalmology

## 2023-07-02 ENCOUNTER — Ambulatory Visit: Payer: Self-pay | Admitting: Anesthesiology

## 2023-07-02 ENCOUNTER — Ambulatory Visit
Admission: RE | Admit: 2023-07-02 | Discharge: 2023-07-02 | Disposition: A | Discharge status: Home / Self Care | Payer: Medicare Other | Attending: Ophthalmology | Admitting: Ophthalmology

## 2023-07-02 ENCOUNTER — Other Ambulatory Visit: Payer: Self-pay

## 2023-07-02 ENCOUNTER — Encounter: Payer: Self-pay | Admitting: Ophthalmology

## 2023-07-02 DIAGNOSIS — Z7984 Long term (current) use of oral hypoglycemic drugs: Secondary | ICD-10-CM | POA: Diagnosis not present

## 2023-07-02 DIAGNOSIS — E1136 Type 2 diabetes mellitus with diabetic cataract: Secondary | ICD-10-CM | POA: Diagnosis not present

## 2023-07-02 DIAGNOSIS — Z794 Long term (current) use of insulin: Secondary | ICD-10-CM | POA: Diagnosis not present

## 2023-07-02 DIAGNOSIS — H2512 Age-related nuclear cataract, left eye: Secondary | ICD-10-CM | POA: Diagnosis present

## 2023-07-02 DIAGNOSIS — G4730 Sleep apnea, unspecified: Secondary | ICD-10-CM | POA: Diagnosis not present

## 2023-07-02 DIAGNOSIS — I10 Essential (primary) hypertension: Secondary | ICD-10-CM | POA: Insufficient documentation

## 2023-07-02 DIAGNOSIS — Z7985 Long-term (current) use of injectable non-insulin antidiabetic drugs: Secondary | ICD-10-CM | POA: Insufficient documentation

## 2023-07-02 HISTORY — PX: CATARACT EXTRACTION W/PHACO: SHX586

## 2023-07-02 HISTORY — DX: Sleep apnea, unspecified: G47.30

## 2023-07-02 HISTORY — DX: Essential (primary) hypertension: I10

## 2023-07-02 LAB — GLUCOSE, CAPILLARY: Glucose-Capillary: 104 mg/dL — ABNORMAL HIGH (ref 70–99)

## 2023-07-02 SURGERY — PHACOEMULSIFICATION, CATARACT, WITH IOL INSERTION
Anesthesia: Monitor Anesthesia Care | Laterality: Left

## 2023-07-02 MED ORDER — MOXIFLOXACIN HCL 0.5 % OP SOLN
OPHTHALMIC | Status: DC | PRN
  Administered 2023-07-02: .2 mL via OPHTHALMIC

## 2023-07-02 MED ORDER — BRIMONIDINE TARTRATE-TIMOLOL 0.2-0.5 % OP SOLN
OPHTHALMIC | Status: DC | PRN
  Administered 2023-07-02: 1 [drp] via OPHTHALMIC

## 2023-07-02 MED ORDER — TETRACAINE HCL 0.5 % OP SOLN
1.0000 [drp] | OPHTHALMIC | Status: DC | PRN
Start: 2023-07-02 — End: 2023-07-02
  Administered 2023-07-02 (×3): 1 [drp] via OPHTHALMIC

## 2023-07-02 MED ORDER — SIGHTPATH DOSE#1 BSS IO SOLN
INTRAOCULAR | Status: DC | PRN
  Administered 2023-07-02: 76 mL via OPHTHALMIC

## 2023-07-02 MED ORDER — MIDAZOLAM HCL 2 MG/2ML IJ SOLN
INTRAMUSCULAR | Status: DC | PRN
  Administered 2023-07-02 (×2): 1 mg via INTRAVENOUS

## 2023-07-02 MED ORDER — LIDOCAINE HCL (PF) 2 % IJ SOLN
INTRAOCULAR | Status: DC | PRN
  Administered 2023-07-02: 1 mL

## 2023-07-02 MED ORDER — SIGHTPATH DOSE#1 NA CHONDROIT SULF-NA HYALURON 40-17 MG/ML IO SOLN
INTRAOCULAR | Status: DC | PRN
  Administered 2023-07-02 (×2): 1 mL via INTRAOCULAR

## 2023-07-02 MED ORDER — SIGHTPATH DOSE#1 BSS IO SOLN
INTRAOCULAR | Status: DC | PRN
  Administered 2023-07-02: 15 mL via INTRAOCULAR

## 2023-07-02 MED ORDER — ARMC OPHTHALMIC DILATING DROPS
1.0000 | OPHTHALMIC | Status: DC | PRN
  Administered 2023-07-02 (×3): 1 via OPHTHALMIC

## 2023-07-02 MED ORDER — FENTANYL CITRATE (PF) 100 MCG/2ML IJ SOLN
INTRAMUSCULAR | Status: AC
  Filled 2023-07-02: qty 2

## 2023-07-02 MED ORDER — TETRACAINE HCL 0.5 % OP SOLN
OPHTHALMIC | Status: AC
  Filled 2023-07-02: qty 4

## 2023-07-02 MED ORDER — ARMC OPHTHALMIC DILATING DROPS
OPHTHALMIC | Status: AC
  Filled 2023-07-02: qty 0.5

## 2023-07-02 MED ORDER — MIDAZOLAM HCL 2 MG/2ML IJ SOLN
INTRAMUSCULAR | Status: AC
  Filled 2023-07-02: qty 2

## 2023-07-02 MED ORDER — FENTANYL CITRATE (PF) 100 MCG/2ML IJ SOLN
INTRAMUSCULAR | Status: DC | PRN
Start: 2023-07-02 — End: 2023-07-02
  Administered 2023-07-02 (×2): 50 ug via INTRAVENOUS

## 2023-07-02 SURGICAL SUPPLY — 13 items
CATARACT SUITE SIGHTPATH (MISCELLANEOUS) ×1 IMPLANT
CYSTOTOME ANG REV CUT SHRT 25G (CUTTER) ×1 IMPLANT
CYSTOTOME ANGL RVRS SHRT 25G (CUTTER) ×1 IMPLANT
FEE CATARACT SUITE SIGHTPATH (MISCELLANEOUS) ×1 IMPLANT
GLOVE BIOGEL PI IND STRL 8 (GLOVE) ×1 IMPLANT
GLOVE SURG LX STRL 8.0 MICRO (GLOVE) ×1 IMPLANT
GLOVE SURG PROTEXIS BL SZ6.5 (GLOVE) ×1 IMPLANT
GLOVE SURG SYN 6.5 PF PI BL (GLOVE) ×1 IMPLANT
LENS IOL TECNIS EYHANCE 20.0 (Intraocular Lens) IMPLANT
NDL FILTER BLUNT 18X1 1/2 (NEEDLE) ×1 IMPLANT
NEEDLE FILTER BLUNT 18X1 1/2 (NEEDLE) ×1 IMPLANT
RING MALYGIN (MISCELLANEOUS) IMPLANT
SYR 3ML LL SCALE MARK (SYRINGE) ×1 IMPLANT

## 2023-07-02 NOTE — H&P (Signed)
 [facility]   Primary Care Physician:  Barbette Reichmann, MD Ophthalmologist: Dr. Druscilla Brownie  Pre-Procedure History & Physical: HPI:  Kyle Banks is a 68 y.o. male here for cataract surgery.   Past Medical History:  Diagnosis Date   Anemia    Diabetes mellitus (HCC)    Hypertension    Osteomyelitis of foot (HCC)    Sleep apnea    CPAP    Past Surgical History:  Procedure Laterality Date   AMPUTATION TOE Bilateral 09/23/2016   Procedure: AMPUTATION TOE;  Surgeon: Gwyneth Revels, DPM;  Location: [facility];  Service: Podiatry;  Laterality: Bilateral;   AMPUTATION TOE Left 10/03/2016   Procedure: AMPUTATION TOE-left metatarsal;  Surgeon: Linus Galas, DPM;  Location: [facility];  Service: Podiatry;  Laterality: Left;   INCISION AND DRAINAGE Left 09/23/2016   Procedure: INCISION AND DRAINAGE;  Surgeon: Gwyneth Revels, DPM;  Location: [facility];  Service: Podiatry;  Laterality: Left;   IRRIGATION AND DEBRIDEMENT FOOT Left 09/28/2016   Procedure: IRRIGATION AND DEBRIDEMENT FOOT;  Surgeon: Gwyneth Revels, DPM;  Location: [facility];  Service: Podiatry;  Laterality: Left;   IRRIGATION AND DEBRIDEMENT FOOT Left 10/03/2016   Procedure: IRRIGATION AND DEBRIDEMENT FOOT;  Surgeon: Linus Galas, DPM;  Location: [facility];  Service: Podiatry;  Laterality: Left;   IRRIGATION AND DEBRIDEMENT FOOT Right 10/05/2016   Procedure: IRRIGATION AND DEBRIDEMENT FOOT;  Surgeon: Linus Galas, DPM;  Location: [facility];  Service: Podiatry;  Laterality: Right;   IRRIGATION AND DEBRIDEMENT FOOT Bilateral 10/31/2016   Procedure: IRRIGATION AND DEBRIDEMENT FOOT;  Surgeon: Linus Galas, DPM;  Location: [facility];  Service: Podiatry;  Laterality: Bilateral;   JOINT REPLACEMENT     bilateral knee replacements miller and hower pt unsure of dates   PERIPHERAL VASCULAR INTERVENTION Left 09/26/2016   Procedure: Peripheral Vascular Intervention;  Surgeon: Renford Dills, MD;  Location: [facility];  Service:  Cardiovascular;  Laterality: Left;    Prior to Admission medications   Medication Sig Start Date End Date Taking? Authorizing Provider  amLODipine (NORVASC) 10 MG tablet Take 10 mg by mouth daily. 03/05/21  Yes [provider]  aspirin EC 81 MG EC tablet Take 1 tablet (81 mg total) by mouth daily. 10/11/16  Yes Wieting, Richard, MD  Cholecalciferol (VITAMIN D3) 25 MCG (1000 UT) CAPS Take 1 capsule by mouth daily.   Yes [provider]  cyanocobalamin 500 MCG tablet Take 500 mcg by mouth daily.   Yes [provider]  gabapentin (NEURONTIN) 300 MG capsule Take 1 capsule by mouth 2 (two) times daily. 03/31/20 06/26/23 Yes [provider]  Garlic 1000 MG CAPS Take 1 capsule by mouth daily.   Yes [provider]  insulin degludec (TRESIBA) 100 UNIT/ML FlexTouch Pen Inject 54 Units into the skin at bedtime.   Yes [provider]  losartan (COZAAR) 50 MG tablet Take 50 mg by mouth daily.   Yes [provider]  metFORMIN (GLUCOPHAGE) 500 MG tablet Take 500 mg by mouth 2 (two) times daily. 03/05/21  Yes [provider]  Multiple Vitamins-Minerals (MULTIVITAMIN ADULTS PO) Take 1 tablet by mouth daily.   Yes [provider]  Omega-3 Fatty Acids (FISH OIL) 1000 MG CAPS Take 1 capsule by mouth daily.   Yes [provider]  rosuvastatin (CRESTOR) 5 MG tablet Take 5 mg by mouth daily. 07/28/20 06/26/23 Yes [provider]  Semaglutide (OZEMPIC, 0.25 OR 0.5 MG/DOSE, [place]) Inject into the skin once a week. Sundays  Yes [provider]  sildenafil (VIAGRA) 100 MG tablet Take 50 mg by mouth daily as needed. 12/01/20  Yes [provider]    Allergies as of 06/04/2023   (No Known Allergies)    Family History  Problem Relation Age of Onset   Diabetes Mother    Stroke Mother    Diabetes Maternal Grandmother     Social History   Socioeconomic History   Marital status: Divorced    Spouse name:  Not on file   Number of children: Not on file   Years of education: Not on file   Highest education level: Not on file  Occupational History   Not on file  Tobacco Use   Smoking status: Never   Smokeless tobacco: Never  Vaping Use   Vaping status: Never Used  Substance and Sexual Activity   Alcohol use: No   Drug use: No   Sexual activity: Yes  Other Topics Concern   Not on file  Social History Narrative   Not on file   Social Drivers of Health   Financial Resource Strain: Low Risk  (04/04/2023)   Received from [facility]   Overall Financial Resource Strain (CARDIA)    Difficulty of Paying Living Expenses: Not hard at all  Food Insecurity: No Food Insecurity (04/04/2023)   Received from [facility]   Hunger Vital Sign    Worried About Running Out of Food in the Last Year: Never true    Ran Out of Food in the Last Year: Never true  Transportation Needs: No Transportation Needs (04/04/2023)   Received from [facility] - Transportation    In the past 12 months, has lack of transportation kept you from medical appointments or from getting medications?: No    Lack of Transportation (Non-Medical): No  Physical Activity: Not on file  Stress: Not on file  Social Connections: Not on file  Intimate Partner Violence: Not on file    Review of Systems: See HPI, otherwise negative ROS  Physical Exam: BP (!) 179/102   Pulse 97   Temp 99 F (37.2 C) (Temporal)   Resp 12   Ht 5\' 7"  (1.702 m)   Wt 114.4 kg   SpO2 98%   BMI 39.52 kg/m  General:   Alert, cooperative in NAD Head:  Normocephalic and atraumatic. Respiratory:  Normal work of breathing. Cardiovascular:  RRR  Impression/Plan: Kyle Banks is here for cataract surgery.  Risks, benefits, limitations, and alternatives regarding cataract surgery have been reviewed with the patient.  Questions have been answered.  All parties agreeable.   Galen Manila, MD  07/02/2023, 7:16 AM

## 2023-07-02 NOTE — Op Note (Signed)
 PREOPERATIVE DIAGNOSIS:  Nuclear sclerotic cataract of the left eye.   POSTOPERATIVE DIAGNOSIS:  Nuclear sclerotic cataract of the left eye.   OPERATIVE PROCEDURE:ORPROCALL@   SURGEON:  Galen Manila, MD.   ANESTHESIA:  Anesthesiologist: Marisue Humble, MD CRNA: Barbette Hair, CRNA  1.      Managed anesthesia care. 2.     0.54ml of Shugarcaine was instilled following the paracentesis   COMPLICATIONS: Viscoelastic was used to raise the pupil margin.  A  Malyugin ring was placed as the pupil would not achieve sufficient pharmacologic dilation to undergo cataract extraction safely.( The ring was removed atraumatically following insertion of the IOL.)    TECHNIQUE:   Stop and chop   DESCRIPTION OF PROCEDURE:  The patient was examined and consented in the preoperative holding area where the aforementioned topical anesthesia was applied to the left eye and then brought back to the Operating Room where the left eye was prepped and draped in the usual sterile ophthalmic fashion and a lid speculum was placed. A paracentesis was created with the side port blade and the anterior chamber was filled with viscoelastic. A near clear corneal incision was performed with the steel keratome. A continuous curvilinear capsulorrhexis was performed with a cystotome followed by the capsulorrhexis forceps. Hydrodissection and hydrodelineation were carried out with BSS on a blunt cannula. The lens was removed in a stop and chop  technique and the remaining cortical material was removed with the irrigation-aspiration handpiece. The capsular bag was inflated with viscoelastic and the Technis [id number] lens was placed in the capsular bag without complication. The remaining viscoelastic was removed from the eye with the irrigation-aspiration handpiece. The wounds were hydrated. The anterior chamber was flushed with BSS and the eye was inflated to physiologic pressure. 0.74ml Vigamox was placed in the anterior chamber. The  wounds were found to be water tight. The eye was dressed with Combigan. The patient was given protective glasses to wear throughout the day and a shield with which to sleep tonight. The patient was also given drops with which to begin a drop regimen today and will follow-up with me in one day. Implant Name Type Inv. Item Serial No. Manufacturer Lot No. LRB No. Used Action  LENS IOL TECNIS EYHANCE 20.0 - [id number] Intraocular Lens LENS IOL TECNIS EYHANCE 20.0 [id number] [employer]  Left 1 Implanted    Procedure(s): CATARACT EXTRACTION PHACO AND INTRAOCULAR LENS PLACEMENT (IOC) LEFT MALYUGIN DIABETIC 14.52, 01:27.5 (Left)  Electronically signed: Galen Manila 07/02/2023 7:55 AM

## 2023-07-02 NOTE — Anesthesia Postprocedure Evaluation (Signed)
 Anesthesia Post Note  Patient: Kyle Banks  Procedure(s) Performed: CATARACT EXTRACTION PHACO AND INTRAOCULAR LENS PLACEMENT (IOC) LEFT MALYUGIN DIABETIC 14.52, 01:27.5 (Left)  Patient location during evaluation: PACU Anesthesia Type: MAC Level of consciousness: awake and alert Pain management: pain level controlled Vital Signs Assessment: post-procedure vital signs reviewed and stable Respiratory status: spontaneous breathing, nonlabored ventilation, respiratory function stable and patient connected to nasal cannula oxygen Cardiovascular status: stable and blood pressure returned to baseline Postop Assessment: no apparent nausea or vomiting Anesthetic complications: no   No notable events documented.   Last Vitals:  Vitals:   07/02/23 0756 07/02/23 0800  BP: (!) 143/83 138/77  Pulse: 90 87  Resp: 11 13  Temp: 36.7 C   SpO2: 95% 95%    Last Pain:  Vitals:   07/02/23 0800  TempSrc:   PainSc: 0-No pain                 Sharlotte Baka C Farron Lafond

## 2023-07-02 NOTE — Transfer of Care (Signed)
 Immediate Anesthesia Transfer of Care Note  Patient: Kyle Banks  Procedure(s) Performed: CATARACT EXTRACTION PHACO AND INTRAOCULAR LENS PLACEMENT (IOC) LEFT MALYUGIN DIABETIC 14.52, 01:27.5 (Left)  Patient Location: PACU  Anesthesia Type: MAC  Level of Consciousness: awake, alert  and patient cooperative  Airway and Oxygen Therapy: Patient Spontanous Breathing and Patient connected to supplemental oxygen  Post-op Assessment: Post-op Vital signs reviewed, Patient's Cardiovascular Status Stable, Respiratory Function Stable, Patent Airway and No signs of Nausea or vomiting  Post-op Vital Signs: Reviewed and stable  Complications: No notable events documented.

## 2023-07-04 ENCOUNTER — Encounter: Payer: Self-pay | Admitting: Ophthalmology

## 2024-02-28 ENCOUNTER — Other Ambulatory Visit: Payer: Self-pay
